# Patient Record
Sex: Female | Born: 1960
Health system: Southern US, Community
[De-identification: ages and names within clinical notes are randomized; demographics above are authoritative.]

## PROBLEM LIST (undated history)

## (undated) DIAGNOSIS — R7302 Impaired glucose tolerance (oral): Secondary | ICD-10-CM

## (undated) DIAGNOSIS — R1013 Epigastric pain: Secondary | ICD-10-CM

## (undated) DIAGNOSIS — E785 Hyperlipidemia, unspecified: Secondary | ICD-10-CM

## (undated) DIAGNOSIS — E669 Obesity, unspecified: Secondary | ICD-10-CM

## (undated) DIAGNOSIS — I519 Heart disease, unspecified: Secondary | ICD-10-CM

## (undated) DIAGNOSIS — K921 Melena: Secondary | ICD-10-CM

## (undated) DIAGNOSIS — I10 Essential (primary) hypertension: Secondary | ICD-10-CM

## (undated) DIAGNOSIS — I429 Cardiomyopathy, unspecified: Secondary | ICD-10-CM

## (undated) DIAGNOSIS — R011 Cardiac murmur, unspecified: Secondary | ICD-10-CM

## (undated) DIAGNOSIS — I509 Heart failure, unspecified: Secondary | ICD-10-CM

## (undated) HISTORY — DX: Melena: K92.1

## (undated) HISTORY — DX: Cardiomyopathy, unspecified: I42.9

## (undated) HISTORY — DX: Obesity, unspecified: E66.9

## (undated) HISTORY — DX: Epigastric pain: R10.13

## (undated) HISTORY — DX: Heart disease, unspecified: I51.9

## (undated) HISTORY — DX: Essential (primary) hypertension: I10

## (undated) HISTORY — DX: Hyperlipidemia, unspecified: E78.5

## (undated) HISTORY — PX: ABDOMINAL HYSTERECTOMY: SHX81

## (undated) HISTORY — PX: TUBAL LIGATION: SHX77

## (undated) HISTORY — DX: Cardiac murmur, unspecified: R01.1

## (undated) HISTORY — DX: Impaired glucose tolerance (oral): R73.02

## (undated) HISTORY — PX: OOPHORECTOMY: SHX86

---

## 2002-01-21 ENCOUNTER — Ambulatory Visit (HOSPITAL_COMMUNITY): Admission: RE | Admit: 2002-01-21 | Discharge: 2002-01-21 | Payer: Self-pay | Admitting: Internal Medicine

## 2002-01-21 ENCOUNTER — Encounter: Payer: Self-pay | Admitting: Internal Medicine

## 2002-07-28 ENCOUNTER — Emergency Department (HOSPITAL_COMMUNITY): Admission: EM | Admit: 2002-07-28 | Discharge: 2002-07-28 | Payer: Self-pay | Admitting: Emergency Medicine

## 2002-12-19 ENCOUNTER — Encounter: Payer: Self-pay | Admitting: Internal Medicine

## 2002-12-19 ENCOUNTER — Ambulatory Visit (HOSPITAL_COMMUNITY): Admission: RE | Admit: 2002-12-19 | Discharge: 2002-12-19 | Payer: Self-pay | Admitting: Internal Medicine

## 2004-01-11 ENCOUNTER — Ambulatory Visit (HOSPITAL_COMMUNITY): Admission: RE | Admit: 2004-01-11 | Discharge: 2004-01-11 | Payer: Self-pay | Admitting: Internal Medicine

## 2004-03-09 ENCOUNTER — Emergency Department (HOSPITAL_COMMUNITY): Admission: EM | Admit: 2004-03-09 | Discharge: 2004-03-10 | Payer: Self-pay | Admitting: *Deleted

## 2005-04-07 ENCOUNTER — Ambulatory Visit: Payer: Self-pay | Admitting: Family Medicine

## 2005-04-09 ENCOUNTER — Encounter (HOSPITAL_COMMUNITY): Admission: RE | Admit: 2005-04-09 | Discharge: 2005-05-09 | Payer: Self-pay | Admitting: Family Medicine

## 2005-04-21 ENCOUNTER — Ambulatory Visit: Payer: Self-pay | Admitting: Family Medicine

## 2005-06-09 ENCOUNTER — Ambulatory Visit: Payer: Self-pay | Admitting: Family Medicine

## 2005-06-10 ENCOUNTER — Ambulatory Visit (HOSPITAL_COMMUNITY): Admission: RE | Admit: 2005-06-10 | Discharge: 2005-06-10 | Payer: Self-pay | Admitting: Family Medicine

## 2005-07-14 ENCOUNTER — Ambulatory Visit: Payer: Self-pay | Admitting: Orthopedic Surgery

## 2005-07-22 ENCOUNTER — Encounter (HOSPITAL_COMMUNITY): Admission: RE | Admit: 2005-07-22 | Discharge: 2005-08-21 | Payer: Self-pay | Admitting: Orthopedic Surgery

## 2005-08-25 ENCOUNTER — Ambulatory Visit: Payer: Self-pay | Admitting: Orthopedic Surgery

## 2005-08-25 ENCOUNTER — Encounter (HOSPITAL_COMMUNITY): Admission: RE | Admit: 2005-08-25 | Discharge: 2005-09-24 | Payer: Self-pay | Admitting: Orthopedic Surgery

## 2005-09-02 ENCOUNTER — Ambulatory Visit: Payer: Self-pay | Admitting: Family Medicine

## 2005-11-10 ENCOUNTER — Ambulatory Visit: Payer: Self-pay | Admitting: Family Medicine

## 2005-11-10 ENCOUNTER — Emergency Department (HOSPITAL_COMMUNITY): Admission: EM | Admit: 2005-11-10 | Discharge: 2005-11-10 | Payer: Self-pay | Admitting: Emergency Medicine

## 2005-11-13 ENCOUNTER — Ambulatory Visit (HOSPITAL_COMMUNITY): Admission: RE | Admit: 2005-11-13 | Discharge: 2005-11-13 | Payer: Self-pay | Admitting: Family Medicine

## 2007-12-09 ENCOUNTER — Ambulatory Visit: Payer: Self-pay | Admitting: Family Medicine

## 2007-12-11 ENCOUNTER — Encounter: Payer: Self-pay | Admitting: Family Medicine

## 2007-12-11 LAB — CONVERTED CEMR LAB
Hemoglobin, Urine: NEGATIVE
Ketones, ur: NEGATIVE mg/dL
Nitrite: NEGATIVE
Protein, ur: NEGATIVE mg/dL
Urobilinogen, UA: 0.2 (ref 0.0–1.0)
pH: 8 (ref 5.0–8.0)

## 2007-12-13 ENCOUNTER — Encounter: Payer: Self-pay | Admitting: Family Medicine

## 2007-12-13 ENCOUNTER — Ambulatory Visit (HOSPITAL_COMMUNITY): Admission: RE | Admit: 2007-12-13 | Discharge: 2007-12-13 | Payer: Self-pay | Admitting: Family Medicine

## 2007-12-13 LAB — CONVERTED CEMR LAB
BUN: 13 mg/dL (ref 6–23)
Basophils Absolute: 0 K/uL (ref 0.0–0.1)
Basophils Relative: 0 % (ref 0–1)
CO2: 27 meq/L (ref 19–32)
Calcium: 9 mg/dL (ref 8.4–10.5)
Chloride: 101 meq/L (ref 96–112)
Cholesterol: 173 mg/dL (ref 0–200)
Creatinine, Ser: 0.81 mg/dL (ref 0.40–1.20)
Eosinophils Absolute: 0.2 K/uL (ref 0.0–0.7)
Eosinophils Relative: 4 % (ref 0–5)
Glucose, Bld: 71 mg/dL (ref 70–99)
HCT: 39 % (ref 36.0–46.0)
HDL: 57 mg/dL (ref >39)
Hemoglobin: 11.6 g/dL — ABNORMAL LOW (ref 12.0–15.0)
LDL Cholesterol: 105 mg/dL — ABNORMAL HIGH (ref 0–99)
Lymphocytes Relative: 32 % (ref 12–46)
Lymphs Abs: 1.6 K/uL (ref 0.7–4.0)
MCHC: 29.7 g/dL — ABNORMAL LOW (ref 30.0–36.0)
MCV: 85.3 fL (ref 78.0–100.0)
Monocytes Absolute: 0.4 K/uL (ref 0.1–1.0)
Monocytes Relative: 7 % (ref 3–12)
Neutro Abs: 2.8 K/uL (ref 1.7–7.7)
Neutrophils Relative %: 56 % (ref 43–77)
Platelets: 456 K/uL — ABNORMAL HIGH (ref 150–400)
Potassium: 4 meq/L (ref 3.5–5.3)
RBC: 4.57 M/uL (ref 3.87–5.11)
RDW: 17.4 % — ABNORMAL HIGH (ref 11.5–15.5)
Sodium: 140 meq/L (ref 135–145)
TSH: 2.727 u[IU]/mL (ref 0.350–5.50)
Total CHOL/HDL Ratio: 3
Triglycerides: 57 mg/dL (ref <150)
VLDL: 11 mg/dL (ref 0–40)
WBC: 5 10*3/microliter (ref 4.0–10.5)

## 2007-12-14 ENCOUNTER — Encounter: Payer: Self-pay | Admitting: Family Medicine

## 2008-03-27 ENCOUNTER — Ambulatory Visit: Payer: Self-pay | Admitting: Family Medicine

## 2008-03-31 DIAGNOSIS — I110 Hypertensive heart disease with heart failure: Secondary | ICD-10-CM | POA: Insufficient documentation

## 2008-03-31 DIAGNOSIS — K3189 Other diseases of stomach and duodenum: Secondary | ICD-10-CM | POA: Insufficient documentation

## 2008-03-31 DIAGNOSIS — R1013 Epigastric pain: Secondary | ICD-10-CM

## 2008-03-31 DIAGNOSIS — I10 Essential (primary) hypertension: Secondary | ICD-10-CM | POA: Insufficient documentation

## 2008-05-08 ENCOUNTER — Ambulatory Visit: Payer: Self-pay | Admitting: Family Medicine

## 2008-05-09 ENCOUNTER — Encounter: Payer: Self-pay | Admitting: Family Medicine

## 2008-05-10 ENCOUNTER — Ambulatory Visit (HOSPITAL_COMMUNITY): Admission: RE | Admit: 2008-05-10 | Discharge: 2008-05-10 | Payer: Self-pay | Admitting: Family Medicine

## 2008-05-12 ENCOUNTER — Ambulatory Visit (HOSPITAL_COMMUNITY): Admission: RE | Admit: 2008-05-12 | Discharge: 2008-05-12 | Payer: Self-pay | Admitting: Family Medicine

## 2008-06-27 ENCOUNTER — Ambulatory Visit (HOSPITAL_COMMUNITY): Admission: RE | Admit: 2008-06-27 | Discharge: 2008-06-27 | Payer: Self-pay | Admitting: Family Medicine

## 2008-08-08 ENCOUNTER — Encounter: Payer: Self-pay | Admitting: Family Medicine

## 2008-09-29 ENCOUNTER — Ambulatory Visit: Payer: Self-pay | Admitting: Family Medicine

## 2008-09-29 DIAGNOSIS — N9489 Other specified conditions associated with female genital organs and menstrual cycle: Secondary | ICD-10-CM | POA: Insufficient documentation

## 2008-12-04 ENCOUNTER — Ambulatory Visit: Payer: Self-pay | Admitting: Family Medicine

## 2008-12-04 DIAGNOSIS — M549 Dorsalgia, unspecified: Secondary | ICD-10-CM | POA: Insufficient documentation

## 2008-12-04 LAB — CONVERTED CEMR LAB
AST: 17 units/L (ref 0–37)
BUN: 12 mg/dL (ref 6–23)
Bilirubin, Direct: <0.1 mg/dL (ref 0.0–0.3)
CO2: 22 meq/L (ref 19–32)
Calcium: 8.8 mg/dL (ref 8.4–10.5)
Glucose, Bld: 66 mg/dL — ABNORMAL LOW (ref 70–99)
Sodium: 141 meq/L (ref 135–145)
Total Bilirubin: 0.3 mg/dL (ref 0.3–1.2)
Total CHOL/HDL Ratio: 3.4

## 2009-05-03 ENCOUNTER — Ambulatory Visit: Payer: Self-pay | Admitting: Family Medicine

## 2009-05-03 DIAGNOSIS — H547 Unspecified visual loss: Secondary | ICD-10-CM | POA: Insufficient documentation

## 2009-05-18 ENCOUNTER — Encounter: Payer: Self-pay | Admitting: Family Medicine

## 2009-08-02 ENCOUNTER — Telehealth: Payer: Self-pay | Admitting: Family Medicine

## 2009-12-19 ENCOUNTER — Encounter: Payer: Self-pay | Admitting: Family Medicine

## 2010-03-04 ENCOUNTER — Ambulatory Visit: Payer: Self-pay | Admitting: Family Medicine

## 2010-03-04 DIAGNOSIS — M25569 Pain in unspecified knee: Secondary | ICD-10-CM | POA: Insufficient documentation

## 2010-11-10 ENCOUNTER — Encounter: Payer: Self-pay | Admitting: Family Medicine

## 2010-11-11 ENCOUNTER — Encounter: Payer: Self-pay | Admitting: Family Medicine

## 2010-11-17 LAB — CONVERTED CEMR LAB
Bilirubin Urine: NEGATIVE
Glucose, Urine, Semiquant: NEGATIVE
Protein, U semiquant: NEGATIVE
pH: 6.5

## 2010-11-19 NOTE — Op Note (Signed)
Summary: LAPAROSCOPIC LEFT OOPHORECTOMY  LAPAROSCOPIC LEFT OOPHORECTOMY   Imported By: Lind Guest 12/26/2009 08:56:22  _____________________________________________________________________  External Attachment:    Type:   Image     Comment:   External Document

## 2010-11-19 NOTE — Assessment & Plan Note (Signed)
Summary: pain- room 1   Vital Signs:  Patient profile:   50 year old female Menstrual status:  perimenopausal Height:      62 inches Weight:      212.25 pounds BMI:     38.96 O2 Sat:      98 % on Room air Pulse rate:   100 / minute Resp:     16 per minute BP sitting:   190 / 90  (left arm)  Vitals Entered By: Adella Hare LPN (Mar 04, 2010 1:16 PM) CC: pain in legs and burning in chest Is Patient Diabetic? No Comments patient is not taking any medications at this time   CC:  pain in legs and burning in chest.  History of Present Illness: Pt states she has been out of BP meds since the first week of March. She is unemployed and has been unable to afford her meds. She reports intermittent indigestion, burning in her chest.  Worse at night when lies down.  No fluid brash.  She has a hx of dyspepsia & used Omeprazole in the past. Pt states she has gained wt since out of work. Also has burning intermittent pain in bilat knees.  She reports this at rest & with wt bearing.  No trauma.  No prev hx of knee pain.  Has had back pain in the past.    Current Medications (verified): 1)  None  Allergies (verified): No Known Drug Allergies  Past History:  Past medical history reviewed for relevance to current acute and chronic problems.  Past Medical History: Reviewed history from 03/31/2008 and no changes required. DYSPEPSIA (ICD-536.8) OBESITY (ICD-278.00) HYPERTENSION (ICD-401.9) PMH reviewed for relevance  Review of Systems CV:  Complains of chest pain or discomfort; denies palpitations. Resp:  Denies cough and shortness of breath. GI:  Complains of indigestion; denies abdominal pain, change in bowel habits, loss of appetite, nausea, and vomiting. GU:  Denies dysuria and urinary frequency. MS:  Complains of mid back pain.  Physical Exam  General:  Well-developed,well-nourished,in no acute distress; alert,appropriate and cooperative throughout examination Head:   Normocephalic and atraumatic without obvious abnormalities. No apparent alopecia or balding. Eyes:  No corneal or conjunctival inflammation noted. EOMI. Perrla. Funduscopic exam benign, without hemorrhages, exudates or papilledema.  Ears:  External ear exam shows no significant lesions or deformities.  Otoscopic examination reveals clear canals, tympanic membranes are intact bilaterally without bulging, retraction, inflammation or discharge. Hearing is grossly normal bilaterally. Nose:  External nasal examination shows no deformity or inflammation. Nasal mucosa are pink and moist without lesions or exudates. Mouth:  Oral mucosa and oropharynx without lesions or exudates.  poor dentition and teeth missing.   Neck:  No deformities, masses, or tenderness noted.no thyromegaly.   Lungs:  Normal respiratory effort, chest expands symmetrically. Lungs are clear to auscultation, no crackles or wheezes. Heart:  Normal rate and regular rhythm. S1 and S2 normal without gallop, murmur, click, rub or other extra sounds. Abdomen:  abdomen soft and non-tender without masses, organomegaly or hernias noted. Msk:  Bilat Knees: normal ROM, no joint swelling, no redness over joints, no joint deformities, no joint instability, no crepitation, and no muscle atrophy.  Mild TTP Rt knee medial & lateral joint lines. Extremities:  No PTE bilat LE.  No calf tenderness Neurologic:  alert & oriented X3, strength normal in all extremities, sensation intact to light touch, gait normal, and DTRs symmetrical and normal.   Cervical Nodes:  No lymphadenopathy noted Psych:  Cognition  and judgment appear intact. Alert and cooperative with normal attention span and concentration. No apparent delusions, illusions, hallucinations   Impression & Recommendations:  Problem # 1:  HYPERTENSION (ICD-401.9) Assessment Deteriorated Discussed with pt the risk of untreated HTN, including kidney failure, stroke and heart problems.  The following  medications were removed from the medication list:    Maxzide 75-50 Mg Tabs (Triamterene-hctz) .Marland Kitchen... Take 1 tablet by mouth once a day    Clonidine Hcl 0.2 Mg Tabs (Clonidine hcl) .Marland Kitchen... Take 1 tab by mouth at bedtime Her updated medication list for this problem includes:    Maxzide 75-50 Mg Tabs (Triamterene-hctz) .Marland Kitchen... Take 1 daily for blood pressure    Clonidine Hcl 0.2 Mg Tabs (Clonidine hcl) .Marland Kitchen... Take 1 daily for blood pressure  Orders: Urinalysis ([id number])  Problem # 2:  DYSPEPSIA (ICD-536.8) Assessment: Deteriorated Advised pt to avoid Ibuprofen at this time.  Problem # 3:  KNEE PAIN, BILATERAL (ICD-719.46) Suspect DJD. Advised pt to avoid Ibuprofen at this time due to her GI complaints. To use Tylenol as needed.  The following medications were removed from the medication list:    Ibuprofen 800 Mg Tabs (Ibuprofen) .Marland Kitchen... Take 1 tablet by mouth two times a day as needed  Complete Medication List: 1)  Maxzide 75-50 Mg Tabs (Triamterene-hctz) .... Take 1 daily for blood pressure 2)  Clonidine Hcl 0.2 Mg Tabs (Clonidine hcl) .... Take 1 daily for blood pressure 3)  Ranitidine Hcl 150 Mg Caps (Ranitidine hcl) .... Take 1 two times a day for indigestion  Other Orders: EKG w/ Interpretation (93000)  Patient Instructions: 1)  Please schedule a follow-up appointment in 1 month. 2)  It is important that you exercise regularly at least 20 minutes 5 times a week. If you develop chest pain, have severe difficulty breathing, or feel very tired , stop exercising immediately and seek medical attention. 3)  You need to lose weight. Consider a lower calorie diet and regular exercise.  4)  It is very important that you take your blood pressure medications every day!  If you dont you are putting your self at risk for heart problems, stroke and kidney damage. 5)  I have also prescribed a medication for your indigestion. 6)  Take 650-1000mg  of Tylenol every 4-6 hours as needed for relief of  pain or comfort of fever AVOID taking more than 4000mg   in a 24 hour period (can cause liver damage in higher doses). Prescriptions: RANITIDINE HCL 150 MG CAPS (RANITIDINE HCL) take 1 two times a day for indigestion  #180 x 1   Entered and Authorized by:   Esperanza Sheets PA   Signed by:   Esperanza Sheets PA on 03/04/2010   Method used:   Electronically to        [facility] (retail)       [address]       [place], [place]  [address]       Ph: [phone]       Fax: [phone]   RxID:   [id number] CLONIDINE HCL 0.2 MG TABS (CLONIDINE HCL) take 1 daily for blood pressure  #90 x 1   Entered and Authorized by:   Esperanza Sheets PA   Signed by:   Esperanza Sheets PA on 03/04/2010   Method used:   Electronically to        [facility] (retail)  [address]       [place], [place]  [address]       Ph: [phone]       Fax: [phone]   RxID:   [id number] MAXZIDE 75-50 MG TABS (TRIAMTERENE-HCTZ) take 1 daily for blood pressure  #90 x 1   Entered and Authorized by:   Esperanza Sheets PA   Signed by:   Esperanza Sheets PA on 03/04/2010   Method used:   Electronically to        [facility] (retail)       [address]       [place], [place]  [address]       Ph: [phone]       Fax: [phone]   RxID:   [id number]   Laboratory Results   Urine Tests  Date/Time Received: Mar 04, 2010 1:57 PM  Date/Time Reported: Mar 04, 2010 1:57 PM   Routine Urinalysis   Color: yellow Appearance: Clear Glucose: negative   (Normal Range: Negative) Bilirubin: negative   (Normal Range: Negative) Ketone: negative   (Normal Range: Negative) Spec. Gravity: 1.025   (Normal Range: 1.003-1.035) Blood: trace-intact   (Normal Range: Negative) pH: 6.5   (Normal Range: 5.0-8.0) Protein: negative   (Normal Range: Negative) Urobilinogen: 1.0   (Normal Range: 0-1) Nitrite: negative   (Normal Range:  Negative) Leukocyte Esterace: trace   (Normal Range: Negative)

## 2011-04-01 ENCOUNTER — Encounter: Payer: Self-pay | Admitting: Family Medicine

## 2011-04-03 ENCOUNTER — Encounter: Payer: Self-pay | Admitting: Family Medicine

## 2011-04-07 ENCOUNTER — Other Ambulatory Visit: Payer: Self-pay | Admitting: Family Medicine

## 2011-04-07 ENCOUNTER — Encounter: Payer: Self-pay | Admitting: Family Medicine

## 2011-04-07 ENCOUNTER — Ambulatory Visit (INDEPENDENT_AMBULATORY_CARE_PROVIDER_SITE_OTHER): Payer: PRIVATE HEALTH INSURANCE | Admitting: Family Medicine

## 2011-04-07 VITALS — BP 190/112 | HR 88 | Resp 16 | Ht 62.0 in | Wt 220.1 lb

## 2011-04-07 DIAGNOSIS — R7309 Other abnormal glucose: Secondary | ICD-10-CM

## 2011-04-07 DIAGNOSIS — M25569 Pain in unspecified knee: Secondary | ICD-10-CM

## 2011-04-07 DIAGNOSIS — R5383 Other fatigue: Secondary | ICD-10-CM

## 2011-04-07 DIAGNOSIS — K3189 Other diseases of stomach and duodenum: Secondary | ICD-10-CM

## 2011-04-07 DIAGNOSIS — R1013 Epigastric pain: Secondary | ICD-10-CM

## 2011-04-07 DIAGNOSIS — R079 Chest pain, unspecified: Secondary | ICD-10-CM | POA: Insufficient documentation

## 2011-04-07 DIAGNOSIS — R7302 Impaired glucose tolerance (oral): Secondary | ICD-10-CM

## 2011-04-07 DIAGNOSIS — Z13820 Encounter for screening for osteoporosis: Secondary | ICD-10-CM

## 2011-04-07 DIAGNOSIS — R5381 Other malaise: Secondary | ICD-10-CM

## 2011-04-07 DIAGNOSIS — E669 Obesity, unspecified: Secondary | ICD-10-CM

## 2011-04-07 DIAGNOSIS — I10 Essential (primary) hypertension: Secondary | ICD-10-CM

## 2011-04-07 DIAGNOSIS — Z139 Encounter for screening, unspecified: Secondary | ICD-10-CM

## 2011-04-07 MED ORDER — AMLODIPINE BESYLATE 5 MG PO TABS: 5.0000 mg | ORAL_TABLET | Freq: Every day | ORAL | Status: DC

## 2011-04-07 MED ORDER — TRIAMTERENE-HCTZ 75-50 MG PO TABS: 1.0000 | ORAL_TABLET | Freq: Every day | ORAL | Status: DC

## 2011-04-07 NOTE — Assessment & Plan Note (Signed)
Medication compliance addressed. Commitment to regular exercise and healthy  food choices, with portion control discussed. DASH diet and low fat diet discussed and literature offered. Changes in medication made at this visit.  

## 2011-04-07 NOTE — Progress Notes (Signed)
  Subjective:    Patient ID: Michelle David, female    DOB: [birth year], 50 y.o.   MRN: [id number]  HPI Intermittent substernal chest pain , most recent 5 months , radiates to left forearm, burning, aggrvated by activity, no associated lightheadedness or nausea.hs noted that she was having exertional fatigue. Mom died in her early 53's uncertain of cause. She has has noted pain , instability and swelling of the left knee, worsening in the past year C/o sorenensss in the feet    Review of Systems     Objective:   Physical Exam        Assessment & Plan:

## 2011-04-07 NOTE — Patient Instructions (Addendum)
F/u in 6 to 8 weeks.  Fasting labs asap.  You need a mammogram, we will schedule.  You are being referred to cardiology  Re chest pain and abnormal EKG. And also to orthopedics about your left knee  Med is sent in for blood pressure  Need to stop caffeine , and drink water, need to lose weight

## 2011-04-14 ENCOUNTER — Ambulatory Visit (HOSPITAL_COMMUNITY): Payer: PRIVATE HEALTH INSURANCE

## 2011-04-16 NOTE — Assessment & Plan Note (Signed)
New chest pain with fatigue and  abnormal ekg , will refer for cardiology eval

## 2011-04-16 NOTE — Progress Notes (Addendum)
  Subjective:    Patient ID: Michelle David, female    DOB: 1961-03-23, 50 y.o.   MRN: [id number]  HPI  The PT is here for follow up and re-evaluation of chronic medical conditions and  medication management . She has been non compliant with medication, and has not been seen for several years reportedly due to lack of insurance..Preventive health is updated, .   She c/o intermittent chest pain for the past several months and fatigue. Chest pain has no specific relationship to physical activity or position, it is non radiating and she has no associated nausea or diaphoresis, she has however been experiencing increased exertional fatigue     Review of Systems Denies recent fever or chills. Denies sinus pressure, nasal congestion, ear pain or sore throat. Denies chest congestion, productive cough or wheezing. Denies  palpitations, paroxysmal nocturnal dyspnea, orthopnea and leg swelling Denies abdominal pain, nausea, vomiting,diarrhea or constipation.   Denies dysuria, frequency, hesitancy or incontinence. C/o left  knee pain  and limitation in mobility. Denies headaches, seizure, numbness, or tingling. Denies depression, anxiety or insomnia. Denies skin break down or rash.        Objective:   Physical Exam Patient alert and oriented and in no Cardiopulmonary distress.  HEENT: No facial asymmetry, EOMI, no sinus tenderness, TM's clear, Oropharynx pink and moist.  Neck supple no adenopathy.  Chest: Clear to auscultation bilaterally.No reproducible chest wall tenderness CVS: S1, S2 no murmurs, no S3.  ABD: Soft non tender. Bowel sounds normal.  Ext: No edema  MS: Adequate ROM spine, shoulders, hips and reduced in left  knee.  Skin: Intact, no ulcerations or rash noted.  Psych: Good eye contact, normal affect. Memory intact not anxious or depressed appearing.  CNS: CN 2-12 intact, power, tone and sensation normal throughout.        Assessment & Plan:

## 2011-04-17 ENCOUNTER — Encounter: Payer: Self-pay | Admitting: Family Medicine

## 2011-04-22 ENCOUNTER — Encounter: Payer: Self-pay | Admitting: Cardiology

## 2011-04-22 ENCOUNTER — Encounter: Payer: Self-pay | Admitting: *Deleted

## 2011-04-22 ENCOUNTER — Ambulatory Visit (INDEPENDENT_AMBULATORY_CARE_PROVIDER_SITE_OTHER): Payer: PRIVATE HEALTH INSURANCE | Admitting: Cardiology

## 2011-04-22 DIAGNOSIS — R0602 Shortness of breath: Secondary | ICD-10-CM

## 2011-04-22 DIAGNOSIS — I10 Essential (primary) hypertension: Secondary | ICD-10-CM

## 2011-04-22 DIAGNOSIS — R079 Chest pain, unspecified: Secondary | ICD-10-CM

## 2011-04-22 DIAGNOSIS — R011 Cardiac murmur, unspecified: Secondary | ICD-10-CM

## 2011-04-22 NOTE — Progress Notes (Signed)
Clinical Summary Ms. Rather is a 50 y.o.female referred for cardiology consultation by Dr. Lodema Hong. She reports a one-year history of chest discomfort and shortness of breath, mainly with exertion. She describes a "soreness" in her chest that initially seemed to be more precipitated by "bending over." She began to notice this more with activities such as house chores and also at work, has also experienced some "tightness" in her chest. She states that she has been thinking more about her health recently, try to start walking regularly, but was limited by significant fatigue. She is just recently back on medications to address hypertension, and has established regular follow up with Dr. Lodema Hong.  Reports a history of "heart murmur."  She reports no prior cardiac testing beyond ECGs.  Today we discussed her diet, plans for weight loss, also importance of compliance with medical therapy and general medical followup. Also discussed options for further evaluation of her symptoms, to exclude the presence of obstructive CAD.   No Known Allergies  Current outpatient prescriptions:amLODipine (NORVASC) 5 MG tablet, Take 1 tablet (5 mg total) by mouth daily., Disp: 30 tablet, Rfl: 3;  triamterene-hydrochlorothiazide (MAXZIDE) 75-50 MG per tablet, Take 1 tablet by mouth daily., Disp: 30 tablet, Rfl: 5  Past Medical History  Diagnosis Date  . Essential hypertension, benign   . Impaired glucose tolerance   . Dyspepsia   . Obesity     Past Surgical History  Procedure Date  . Abdominal hysterectomy   . Oophorectomy   . Tubal ligation     Family History  Problem Relation Age of Onset  . Heart disease Mother     Died in her 29s with MI  . Hypertension Mother     Social History Ms. Arbaugh reports that she has never smoked. She has never used smokeless tobacco. Ms. Vankirk reports that she does not drink alcohol.  Review of Systems No palpitations or syncope. Some foot pain with ambulation. No  orthopnea, PND. Otherwise reviewed and negative.  Physical Examination Filed Vitals:   04/22/11 0827  BP: 178/99  Pulse: 74  Obese woman in no acute distress. HEENT: Conjunctiva and lids are normal, oropharynx with poor dentition. Neck: Supple, no elevated JVP or carotid bruits. Lungs: Clear to auscultation, nonlabored. Cardiac: Regular rate and rhythm, soft systolic murmur at the base, preserved second heart sound, no S3 gallop or rub. Abdomen: Obese, nontender, bowel sounds present. Skin: Warm and dry. Extremities: No pitting edema, distal pulses one plus. Musculoskeletal: No kyphosis. Neuropsychiatric: Alert and oriented x3, affect appropriate.   ECG Normal sinus rhythm at 75 beats per minute with LVH, nonspecific T wave changes.    Problem List and Plan

## 2011-04-22 NOTE — Assessment & Plan Note (Signed)
Recently back on medications per Dr. Lodema Hong.

## 2011-04-22 NOTE — Patient Instructions (Signed)
Your physician recommends that you schedule a follow-up appointment in: 3 weeks Your physician has requested that you have an echocardiogram. Echocardiography is a painless test that uses sound waves to create images of your heart. It provides your doctor with information about the size and shape of your heart and how well your heart's chambers and valves are working. This procedure takes approximately one hour. There are no restrictions for this procedure.  Your physician has requested that you have en exercise stress myoview. For further information please visit [email or web address] Please follow instruction sheet, as given.

## 2011-04-22 NOTE — Assessment & Plan Note (Signed)
As outlined above, with some features typical for angina in the setting of hypertension, obesity, reported glucose intolerance, and family history of premature coronary artery disease. Baseline ECG shows LVH. She just recently established for regular medical followup, back on medications for blood pressure. Seems to be more motivated to lose weight and start exercising. After discussing the matter, plan is to proceed with an exercise Myoview for objective ischemic evaluation. Followup will be arranged.

## 2011-04-22 NOTE — Assessment & Plan Note (Signed)
Likely benign. In light of her symptoms, 2-D echocardiogram to be obtained for assessment of cardiac structure and function.

## 2011-04-22 NOTE — Assessment & Plan Note (Signed)
Could be multifactorial. Weight loss and better blood pressure control is necessary. We discussed this today. As noted above, further ischemic testing is also being arranged.

## 2011-04-24 ENCOUNTER — Other Ambulatory Visit: Payer: Self-pay | Admitting: Cardiology

## 2011-04-25 ENCOUNTER — Ambulatory Visit: Payer: PRIVATE HEALTH INSURANCE | Admitting: Adult Health

## 2011-04-29 ENCOUNTER — Encounter: Payer: Self-pay | Admitting: *Deleted

## 2011-04-30 ENCOUNTER — Ambulatory Visit (INDEPENDENT_AMBULATORY_CARE_PROVIDER_SITE_OTHER): Payer: PRIVATE HEALTH INSURANCE | Admitting: *Deleted

## 2011-04-30 ENCOUNTER — Encounter (HOSPITAL_COMMUNITY): Payer: Self-pay | Admitting: Cardiology

## 2011-04-30 ENCOUNTER — Encounter (HOSPITAL_COMMUNITY): Payer: PRIVATE HEALTH INSURANCE

## 2011-04-30 ENCOUNTER — Ambulatory Visit (HOSPITAL_COMMUNITY): Payer: PRIVATE HEALTH INSURANCE | Attending: Cardiology

## 2011-04-30 ENCOUNTER — Encounter (HOSPITAL_COMMUNITY)
Admission: RE | Admit: 2011-04-30 | Discharge: 2011-04-30 | Disposition: A | Discharge status: Home / Self Care | Payer: PRIVATE HEALTH INSURANCE | Source: Ambulatory Visit | Attending: Cardiology | Admitting: Cardiology

## 2011-04-30 ENCOUNTER — Encounter (HOSPITAL_COMMUNITY): Payer: Self-pay

## 2011-04-30 DIAGNOSIS — R079 Chest pain, unspecified: Secondary | ICD-10-CM | POA: Insufficient documentation

## 2011-04-30 DIAGNOSIS — I10 Essential (primary) hypertension: Secondary | ICD-10-CM

## 2011-04-30 DIAGNOSIS — R0602 Shortness of breath: Secondary | ICD-10-CM

## 2011-04-30 DIAGNOSIS — R011 Cardiac murmur, unspecified: Secondary | ICD-10-CM

## 2011-04-30 DIAGNOSIS — R0609 Other forms of dyspnea: Secondary | ICD-10-CM | POA: Insufficient documentation

## 2011-04-30 DIAGNOSIS — R0989 Other specified symptoms and signs involving the circulatory and respiratory systems: Secondary | ICD-10-CM | POA: Insufficient documentation

## 2011-04-30 MED ORDER — TECHNETIUM TC 99M TETROFOSMIN IV KIT
30.0000 | PACK | Freq: Once | INTRAVENOUS | Status: AC | PRN
  Administered 2011-04-30: 29.6 via INTRAVENOUS

## 2011-04-30 MED ORDER — TECHNETIUM TC 99M TETROFOSMIN IV KIT
10.0000 | PACK | Freq: Once | INTRAVENOUS | Status: AC | PRN
  Administered 2011-04-30: 10.33 via INTRAVENOUS

## 2011-04-30 NOTE — Progress Notes (Deleted)

## 2011-05-15 ENCOUNTER — Ambulatory Visit: Payer: PRIVATE HEALTH INSURANCE | Admitting: Orthopedic Surgery

## 2011-05-16 NOTE — Progress Notes (Signed)
  Encounter note per Epic request

## 2011-05-23 ENCOUNTER — Ambulatory Visit: Payer: PRIVATE HEALTH INSURANCE | Admitting: Cardiology

## 2011-05-27 ENCOUNTER — Ambulatory Visit: Payer: PRIVATE HEALTH INSURANCE | Admitting: Orthopedic Surgery

## 2011-05-28 ENCOUNTER — Ambulatory Visit (HOSPITAL_COMMUNITY)
Admission: RE | Admit: 2011-05-28 | Discharge: 2011-05-28 | Disposition: A | Discharge status: Home / Self Care | Payer: PRIVATE HEALTH INSURANCE | Source: Ambulatory Visit | Attending: Cardiology | Admitting: Cardiology

## 2011-05-28 DIAGNOSIS — Z8249 Family history of ischemic heart disease and other diseases of the circulatory system: Secondary | ICD-10-CM | POA: Insufficient documentation

## 2011-05-28 DIAGNOSIS — R0989 Other specified symptoms and signs involving the circulatory and respiratory systems: Secondary | ICD-10-CM | POA: Insufficient documentation

## 2011-05-28 DIAGNOSIS — I10 Essential (primary) hypertension: Secondary | ICD-10-CM | POA: Insufficient documentation

## 2011-05-28 DIAGNOSIS — R011 Cardiac murmur, unspecified: Secondary | ICD-10-CM | POA: Insufficient documentation

## 2011-05-28 DIAGNOSIS — R072 Precordial pain: Secondary | ICD-10-CM | POA: Insufficient documentation

## 2011-05-28 DIAGNOSIS — R0609 Other forms of dyspnea: Secondary | ICD-10-CM | POA: Insufficient documentation

## 2011-05-28 NOTE — Progress Notes (Signed)
*  PRELIMINARY RESULTS* Echocardiogram 2D Echocardiogram has been performed.  Michelle David 05/28/2011, 10:11 AM

## 2011-06-02 ENCOUNTER — Encounter: Payer: Self-pay | Admitting: *Deleted

## 2011-06-02 ENCOUNTER — Encounter: Payer: Self-pay | Admitting: Family Medicine

## 2011-06-02 ENCOUNTER — Ambulatory Visit (INDEPENDENT_AMBULATORY_CARE_PROVIDER_SITE_OTHER): Payer: PRIVATE HEALTH INSURANCE | Admitting: Family Medicine

## 2011-06-02 VITALS — BP 160/100 | HR 81 | Ht 62.0 in | Wt 221.0 lb

## 2011-06-02 DIAGNOSIS — E669 Obesity, unspecified: Secondary | ICD-10-CM

## 2011-06-02 DIAGNOSIS — Z13820 Encounter for screening for osteoporosis: Secondary | ICD-10-CM

## 2011-06-02 DIAGNOSIS — R5381 Other malaise: Secondary | ICD-10-CM

## 2011-06-02 DIAGNOSIS — R5383 Other fatigue: Secondary | ICD-10-CM

## 2011-06-02 DIAGNOSIS — Z13220 Encounter for screening for lipoid disorders: Secondary | ICD-10-CM

## 2011-06-02 DIAGNOSIS — I10 Essential (primary) hypertension: Secondary | ICD-10-CM

## 2011-06-02 MED ORDER — TRIAMTERENE-HCTZ 37.5-25 MG PO CAPS: 1.0000 | ORAL_CAPSULE | Freq: Every day | ORAL | Status: DC

## 2011-06-02 MED ORDER — AMLODIPINE BESYLATE 10 MG PO TABS: 10.0000 mg | ORAL_TABLET | Freq: Every day | ORAL | Status: DC

## 2011-06-02 NOTE — Patient Instructions (Addendum)
CPE in 6 weeks  Your blood pressure is so HIGH you could have a STROKE, I am changing the doses since you report that you are constantly urinating.  It is VITAL to take tabs regulalrly as prescribed, same time every day  Labs today, fasting  Pls call cardiology for your f/u appt.  We will sched your mammogram  It is important that you exercise regularly at least 30 minutes 5 times a week. If you develop chest pain, have severe difficulty breathing, or feel very tired, stop exercising immediately and seek medical attention    A healthy diet is rich in fruit, vegetables and whole grains. Poultry fish, nuts and beans are a healthy choice for protein rather then red meat. A low sodium diet and drinking 64 ounces of water daily is generally recommended. Oils and sweet should be limited. Carbohydrates especially for those who are diabetic or overweight, should be limited to 34-45 gram per meal. It is important to eat on a regular schedule, at least 3 times daily. Snacks should be primarily fruits, vegetables or nuts.

## 2011-06-03 LAB — CBC WITH DIFFERENTIAL/PLATELET
Basophils Absolute: 0 10*3/uL (ref 0.0–0.1)
Eosinophils Relative: 4 % (ref 0–5)
Lymphocytes Relative: 35 % (ref 12–46)
Lymphs Abs: 2 10*3/uL (ref 0.7–4.0)
MCV: 88.4 fL (ref 78.0–100.0)
Neutro Abs: 3.2 10*3/uL (ref 1.7–7.7)
Neutrophils Relative %: 55 % (ref 43–77)
Platelets: 402 10*3/uL — ABNORMAL HIGH (ref 150–400)
RBC: 4.31 MIL/uL (ref 3.87–5.11)
RDW: 15.6 % — ABNORMAL HIGH (ref 11.5–15.5)
WBC: 5.8 10*3/uL (ref 4.0–10.5)

## 2011-06-03 LAB — BASIC METABOLIC PANEL
CO2: 30 mEq/L (ref 19–32)
Calcium: 9.2 mg/dL (ref 8.4–10.5)
Creat: 0.87 mg/dL (ref 0.50–1.10)
Sodium: 139 mEq/L (ref 135–145)

## 2011-06-03 LAB — LIPID PANEL
HDL: 49 mg/dL (ref >39)
LDL Cholesterol: 135 mg/dL — ABNORMAL HIGH (ref 0–99)
Total CHOL/HDL Ratio: 4 Ratio
Triglycerides: 68 mg/dL (ref <150)
VLDL: 14 mg/dL (ref 0–40)

## 2011-06-03 LAB — HEMOGLOBIN A1C
Hgb A1c MFr Bld: 5.6 % (ref <5.7)
Mean Plasma Glucose: 114 mg/dL (ref <117)

## 2011-06-03 LAB — TSH: TSH: 2.248 u[IU]/mL (ref 0.350–4.500)

## 2011-06-04 LAB — VITAMIN D 1,25 DIHYDROXY
Vitamin D2 1, 25 (OH)2: <8 pg/mL
Vitamin D3 1, 25 (OH)2: 43 pg/mL

## 2011-06-05 ENCOUNTER — Ambulatory Visit: Payer: PRIVATE HEALTH INSURANCE | Admitting: Cardiology

## 2011-06-09 ENCOUNTER — Ambulatory Visit (HOSPITAL_COMMUNITY)
Admission: RE | Admit: 2011-06-09 | Discharge: 2011-06-09 | Disposition: A | Discharge status: Home / Self Care | Payer: PRIVATE HEALTH INSURANCE | Source: Ambulatory Visit | Attending: Family Medicine | Admitting: Family Medicine

## 2011-06-09 DIAGNOSIS — Z139 Encounter for screening, unspecified: Secondary | ICD-10-CM

## 2011-06-09 DIAGNOSIS — Z1231 Encounter for screening mammogram for malignant neoplasm of breast: Secondary | ICD-10-CM | POA: Insufficient documentation

## 2011-06-09 NOTE — Progress Notes (Signed)
  Subjective:    Patient ID: Michelle David, female    DOB: Dec 22, 1960, 50 y.o.   MRN: [id number]  HPI The PT is here for follow up and re-evaluation of chronic medical conditions, medication management and review of any available recent lab and radiology data.  Preventive health is updated, specifically  Cancer screening and Immunization.   Questions or concerns regarding consultations or procedures which the PT has had in the interim are  addressed. The PT denies any adverse reactions to current medications since the last visit.  There are no new concerns.  There are no specific complaints       Review of Systems Denies recent fever or chills. Denies sinus pressure, nasal congestion, ear pain or sore throat. Denies chest congestion, productive cough or wheezing. Denies chest pains, palpitations and leg swelling Denies abdominal pain, nausea, vomiting,diarrhea or constipation.   Denies dysuria, frequency, hesitancy or incontinence. Denies joint pain, swelling and limitation in mobility. Denies headaches, seizures, numbness, or tingling. Denies depression, anxiety or insomnia. Denies skin break down or rash.        Objective:   Physical Exam Patient alert and oriented and in no cardiopulmonary distress.  HEENT: No facial asymmetry, EOMI, no sinus tenderness,  oropharynx pink and moist.  Neck supple no adenopathy.  Chest: Clear to auscultation bilaterally.  CVS: S1, S2 no murmurs, no S3.  ABD: Soft non tender. Bowel sounds normal.  Ext: No edema  MS: Adequate ROM spine, shoulders, hips and knees.  Skin: Intact, no ulcerations or rash noted.  Psych: Good eye contact, normal affect. Memory intact not anxious or depressed appearing.  CNS: CN 2-12 intact, power, tone and sensation normal throughout.        Assessment & Plan:

## 2011-06-09 NOTE — Assessment & Plan Note (Signed)
Deteriorated. Patient re-educated about  the importance of commitment to a  minimum of 150 minutes of exercise per week. The importance of healthy food choices with portion control discussed. Encouraged to start a food diary, count calories and to consider  joining a support group. Sample diet sheets offered. Goals set by the patient for the next several months.    

## 2011-06-09 NOTE — Assessment & Plan Note (Signed)
Medication compliance addressed. Commitment to regular exercise and healthy  food choices, with portion control discussed. DASH diet and low fat diet discussed and literature offered. Changes in medication made at this visit.  

## 2011-06-17 ENCOUNTER — Ambulatory Visit: Payer: PRIVATE HEALTH INSURANCE | Admitting: Orthopedic Surgery

## 2011-07-31 ENCOUNTER — Encounter: Payer: Self-pay | Admitting: Family Medicine

## 2011-08-05 ENCOUNTER — Encounter: Payer: PRIVATE HEALTH INSURANCE | Admitting: Family Medicine

## 2012-01-26 ENCOUNTER — Ambulatory Visit (INDEPENDENT_AMBULATORY_CARE_PROVIDER_SITE_OTHER): Payer: PRIVATE HEALTH INSURANCE | Admitting: Family Medicine

## 2012-01-26 ENCOUNTER — Encounter: Payer: Self-pay | Admitting: Family Medicine

## 2012-01-26 VITALS — BP 180/100 | HR 87 | Resp 16 | Wt 225.0 lb

## 2012-01-26 DIAGNOSIS — F3289 Other specified depressive episodes: Secondary | ICD-10-CM

## 2012-01-26 DIAGNOSIS — F32A Depression, unspecified: Secondary | ICD-10-CM

## 2012-01-26 DIAGNOSIS — M67919 Unspecified disorder of synovium and tendon, unspecified shoulder: Secondary | ICD-10-CM

## 2012-01-26 DIAGNOSIS — R7301 Impaired fasting glucose: Secondary | ICD-10-CM

## 2012-01-26 DIAGNOSIS — N3000 Acute cystitis without hematuria: Secondary | ICD-10-CM

## 2012-01-26 DIAGNOSIS — M719 Bursopathy, unspecified: Secondary | ICD-10-CM

## 2012-01-26 DIAGNOSIS — R319 Hematuria, unspecified: Secondary | ICD-10-CM | POA: Insufficient documentation

## 2012-01-26 DIAGNOSIS — I10 Essential (primary) hypertension: Secondary | ICD-10-CM

## 2012-01-26 DIAGNOSIS — M7551 Bursitis of right shoulder: Secondary | ICD-10-CM | POA: Insufficient documentation

## 2012-01-26 DIAGNOSIS — F329 Major depressive disorder, single episode, unspecified: Secondary | ICD-10-CM

## 2012-01-26 DIAGNOSIS — E669 Obesity, unspecified: Secondary | ICD-10-CM

## 2012-01-26 LAB — POCT URINALYSIS DIPSTICK
Glucose, UA: NEGATIVE
Ketones, UA: NEGATIVE
Spec Grav, UA: 1.02
Urobilinogen, UA: 0.2

## 2012-01-26 MED ORDER — TRIAMTERENE-HCTZ 37.5-25 MG PO CAPS: 1.0000 | ORAL_CAPSULE | Freq: Every day | ORAL | Status: DC

## 2012-01-26 MED ORDER — AMLODIPINE BESYLATE 10 MG PO TABS: 10.0000 mg | ORAL_TABLET | Freq: Every day | ORAL | Status: DC

## 2012-01-26 MED ORDER — PREDNISONE (PAK) 5 MG PO TABS: 5.0000 mg | ORAL_TABLET | ORAL | Status: DC

## 2012-01-26 MED ORDER — FLUOXETINE HCL (PMDD) 10 MG PO CAPS: 10.0000 mg | ORAL_CAPSULE | Freq: Every day | ORAL | Status: DC

## 2012-01-26 NOTE — Assessment & Plan Note (Signed)
Symptomatic with abnormal CCUa , will wait on c/s before prescribing antibiotic

## 2012-01-26 NOTE — Assessment & Plan Note (Signed)
Deteriorated. Patient re-educated about  the importance of commitment to a  minimum of 150 minutes of exercise per week. The importance of healthy food choices with portion control discussed. Encouraged to start a food diary, count calories and to consider  joining a support group. Sample diet sheets offered. Goals set by the patient for the next several months.    

## 2012-01-26 NOTE — Progress Notes (Signed)
  Subjective:    Patient ID: Michelle David, female    DOB: 1961-03-19, 51 y.o.   MRN: [id number]  HPI  The PT is here for follow up and re-evaluation of chronic medical conditions, medication management and review of any available recent lab and radiology data.  Preventive health is updated, specifically  Cancer screening and Immunization.   The PT denies any adverse reactions to current medications since the last visit.  Tearful, c/o feeling depressed , overwhelmed, states she knows she is not taking care of herself, but her spouse is sick and unemployed and things are overwhelming. Not suicidal or homicidal, cries daily, no hallucinations. C/o right  shoulder pain with swelling and reduced mobility, no recent trauma.Also c/o left knee pain C/o urinary frequencty and mild burning for the past 3 days, denies fever, chills , flank pain or nausea    Review of Systems See HPI Denies recent fever or chills. Denies sinus pressure, nasal congestion, ear pain or sore throat. Denies chest congestion, productive cough or wheezing. Denies chest pains, palpitations and leg swelling Denies abdominal pain, nausea, vomiting,diarrhea or constipation.   Denies headaches, seizures, numbness, or tingling. Denies skin break down or rash.        Objective:   Physical Exam  Patient alert and oriented and in no cardiopulmonary distress.Tearful  HEENT: No facial asymmetry, EOMI, no sinus tenderness,  oropharynx pink and moist.  Neck supple no adenopathy.  Chest: Clear to auscultation bilaterally.  CVS: S1, S2 systolic  murmur, no S3.  ABD: Soft non tender. Bowel sounds normal.  Ext: No edema  MS: Adequate ROM spine,decreased in right   shoulder, and left  knees.  Skin: Intact, no ulcerations or rash noted.  Psych: Good eye contact, normal affect. Memory intact  depressed appearing.  CNS: CN 2-12 intact, power, tone and sensation normal throughout.       Assessment & Plan:

## 2012-01-26 NOTE — Patient Instructions (Signed)
CPE in 2 month  Fasting lipid, cmp, HBA1C today.  Medication is sent in for hypertension, bursitis and depression, please take as directed  It is important that you exercise regularly at least 30 minutes 5 times a week. If you develop chest pain, have severe difficulty breathing, or feel very tired, stop exercising immediately and seek medical attention   A healthy diet is rich in fruit, vegetables and whole grains. Poultry fish, nuts and beans are a healthy choice for protein rather then red meat. A low sodium diet and drinking 64 ounces of water daily is generally recommended. Oils and sweet should be limited. Carbohydrates especially for those who are diabetic or overweight, should be limited to 04-45 gram per meal. It is important to eat on a regular schedule, at least 3 times daily. Snacks should be primarily fruits, vegetables or nuts.  Urine will be checked today for infection

## 2012-01-26 NOTE — Assessment & Plan Note (Signed)
Uncontrolled and worsened in the past 3 months, not suicidal or homicidal, pt to start medication, does not want therapy

## 2012-01-26 NOTE — Assessment & Plan Note (Signed)
Uncontrolled, pt to increase dose of medication , maxzide

## 2012-01-26 NOTE — Assessment & Plan Note (Signed)
Specimen sent for c/s

## 2012-01-26 NOTE — Assessment & Plan Note (Signed)
uncontrolled pain with reduced mobility, steroid course, short and sharp

## 2012-01-27 ENCOUNTER — Telehealth: Payer: Self-pay | Admitting: Family Medicine

## 2012-01-27 LAB — LIPID PANEL
Cholesterol: 176 mg/dL (ref 0–200)
HDL: 54 mg/dL (ref >39)
Total CHOL/HDL Ratio: 3.3 Ratio
Triglycerides: 58 mg/dL (ref <150)
VLDL: 12 mg/dL (ref 0–40)

## 2012-01-27 LAB — HEMOGLOBIN A1C
Hgb A1c MFr Bld: 5.7 % — ABNORMAL HIGH (ref <5.7)
Mean Plasma Glucose: 117 mg/dL — ABNORMAL HIGH (ref <117)

## 2012-01-27 LAB — COMPLETE METABOLIC PANEL WITH GFR
AST: 15 U/L (ref 0–37)
Alkaline Phosphatase: 77 U/L (ref 39–117)
BUN: 13 mg/dL (ref 6–23)
Creat: 0.66 mg/dL (ref 0.50–1.10)
GFR, Est Non African American: >89 mL/min
Glucose, Bld: 67 mg/dL — ABNORMAL LOW (ref 70–99)

## 2012-01-27 NOTE — Telephone Encounter (Signed)
Blood pressure was too high for her to take phentermine has to be controlled pls let her know

## 2012-01-27 NOTE — Telephone Encounter (Signed)
Not on med list

## 2012-01-28 ENCOUNTER — Telehealth: Payer: Self-pay | Admitting: Family Medicine

## 2012-01-28 NOTE — Telephone Encounter (Signed)
Patient aware.

## 2012-01-28 NOTE — Telephone Encounter (Signed)
See previous message

## 2012-01-28 NOTE — Telephone Encounter (Signed)
Called patient and left message for them to return call at the office   

## 2012-01-29 LAB — URINE CULTURE

## 2012-03-30 ENCOUNTER — Ambulatory Visit: Payer: PRIVATE HEALTH INSURANCE | Admitting: Family Medicine

## 2012-04-27 ENCOUNTER — Ambulatory Visit: Payer: PRIVATE HEALTH INSURANCE | Admitting: Family Medicine

## 2012-04-30 ENCOUNTER — Ambulatory Visit (INDEPENDENT_AMBULATORY_CARE_PROVIDER_SITE_OTHER): Payer: PRIVATE HEALTH INSURANCE | Admitting: Family Medicine

## 2012-04-30 ENCOUNTER — Encounter: Payer: Self-pay | Admitting: Family Medicine

## 2012-04-30 VITALS — BP 150/102 | HR 71 | Resp 16 | Ht 62.0 in | Wt 224.0 lb

## 2012-04-30 DIAGNOSIS — F329 Major depressive disorder, single episode, unspecified: Secondary | ICD-10-CM

## 2012-04-30 DIAGNOSIS — E669 Obesity, unspecified: Secondary | ICD-10-CM

## 2012-04-30 DIAGNOSIS — I10 Essential (primary) hypertension: Secondary | ICD-10-CM

## 2012-04-30 DIAGNOSIS — F3289 Other specified depressive episodes: Secondary | ICD-10-CM

## 2012-04-30 DIAGNOSIS — F32A Depression, unspecified: Secondary | ICD-10-CM

## 2012-04-30 MED ORDER — CLONIDINE HCL 0.2 MG PO TABS: ORAL_TABLET | ORAL | Status: DC

## 2012-04-30 NOTE — Patient Instructions (Addendum)
cPE  in 2 month  New additional medication for blood pressure is clonidine 0.2mg  at bedtime Continue other blood pressure meds as before, blood pressure is still too high  It is important that you exercise regularly at least  45 minutes 5 times a week. If you develop chest pain, have severe difficulty breathing, or feel very tired, stop exercising immediately and seek medical attention   A healthy diet is rich in fruit, vegetables and whole grains. Poultry fish, nuts and beans are a healthy choice for protein rather then red meat. A low sodium diet and drinking 64 ounces of water daily is generally recommended. Oils and sweet should be limited. Carbohydrates especially for those who are diabetic or overweight, should be limited to 30-45 gram per meal. It is important to eat on a regular schedule, at least 3 times daily. Snacks should be primarily fruits, vegetables or nuts.  sTOP dRINKING sWEETEND dRINKS

## 2012-05-02 NOTE — Assessment & Plan Note (Signed)
Improved, continue current dose of fluoxetine

## 2012-05-02 NOTE — Progress Notes (Signed)
  Subjective:    Patient ID: Michelle David, female    DOB: September 23, 1961, 51 y.o.   MRN: [id number]  HPI The PT is here for follow up and re-evaluation of chronic medical conditions, medication management and review of any available recent lab and radiology data.  Preventive health is updated, specifically  Cancer screening and Immunization.   Questions or concerns regarding consultations or procedures which the PT has had in the interim are  addressed. The PT denies any adverse reactions to current medications since the last visit. Reports improvement in depression on prozac, wants to continue current dose There are no new concerns.  There are no specific complaints       Review of Systems See HPI Denies recent fever or chills. Denies sinus pressure, nasal congestion, ear pain or sore throat. Denies chest congestion, productive cough or wheezing. Denies chest pains, palpitations and leg swelling Denies abdominal pain, nausea, vomiting,diarrhea or constipation.   Denies dysuria, frequency, hesitancy or incontinence. Denies joint pain, swelling and limitation in mobility. Denies headaches, seizures, numbness, or tingling. Denies depression, anxiety or insomnia. Denies skin break down or rash.        Objective:   Physical Exam Patient alert and oriented and in no cardiopulmonary distress.  HEENT: No facial asymmetry, EOMI, no sinus tenderness,  oropharynx pink and moist.  Neck supple no adenopathy.  Chest: Clear to auscultation bilaterally.  CVS: S1, S2 no murmurs, no S3.  ABD: Soft non tender. Bowel sounds normal.  Ext: No edema  MS: Adequate ROM spine, shoulders, hips and knees.  Skin: Intact, no ulcerations or rash noted.  Psych: Good eye contact, normal affect. Memory intact not anxious or depressed appearing.  CNS: CN 2-12 intact, power, tone and sensation normal throughout.       Assessment & Plan:

## 2012-05-02 NOTE — Assessment & Plan Note (Signed)
Uncontrolled add clonidine

## 2012-05-02 NOTE — Assessment & Plan Note (Signed)
Deteriorated. Patient re-educated about  the importance of commitment to a  minimum of 150 minutes of exercise per week. The importance of healthy food choices with portion control discussed. Encouraged to start a food diary, count calories and to consider  joining a support group. Sample diet sheets offered. Goals set by the patient for the next several months.    

## 2012-07-02 ENCOUNTER — Other Ambulatory Visit: Payer: Self-pay | Admitting: Family Medicine

## 2012-07-02 DIAGNOSIS — Z139 Encounter for screening, unspecified: Secondary | ICD-10-CM

## 2012-07-05 ENCOUNTER — Encounter: Payer: PRIVATE HEALTH INSURANCE | Admitting: Family Medicine

## 2012-07-12 ENCOUNTER — Ambulatory Visit (HOSPITAL_COMMUNITY)
Admission: RE | Admit: 2012-07-12 | Discharge: 2012-07-12 | Disposition: A | Discharge status: Home / Self Care | Payer: PRIVATE HEALTH INSURANCE | Source: Ambulatory Visit | Attending: Family Medicine | Admitting: Family Medicine

## 2012-07-12 DIAGNOSIS — Z139 Encounter for screening, unspecified: Secondary | ICD-10-CM

## 2012-07-12 DIAGNOSIS — Z1231 Encounter for screening mammogram for malignant neoplasm of breast: Secondary | ICD-10-CM | POA: Insufficient documentation

## 2012-11-12 ENCOUNTER — Ambulatory Visit: Payer: PRIVATE HEALTH INSURANCE | Admitting: Family Medicine

## 2012-12-03 ENCOUNTER — Ambulatory Visit: Payer: PRIVATE HEALTH INSURANCE | Admitting: Family Medicine

## 2013-05-26 ENCOUNTER — Ambulatory Visit (INDEPENDENT_AMBULATORY_CARE_PROVIDER_SITE_OTHER): Payer: PRIVATE HEALTH INSURANCE | Admitting: Family Medicine

## 2013-05-26 ENCOUNTER — Encounter: Payer: Self-pay | Admitting: Family Medicine

## 2013-05-26 ENCOUNTER — Ambulatory Visit: Payer: PRIVATE HEALTH INSURANCE | Admitting: Family Medicine

## 2013-05-26 VITALS — BP 180/100 | HR 97 | Resp 18 | Ht 62.0 in | Wt 221.0 lb

## 2013-05-26 DIAGNOSIS — H1013 Acute atopic conjunctivitis, bilateral: Secondary | ICD-10-CM | POA: Insufficient documentation

## 2013-05-26 DIAGNOSIS — I10 Essential (primary) hypertension: Secondary | ICD-10-CM

## 2013-05-26 DIAGNOSIS — E669 Obesity, unspecified: Secondary | ICD-10-CM

## 2013-05-26 DIAGNOSIS — F32A Depression, unspecified: Secondary | ICD-10-CM

## 2013-05-26 DIAGNOSIS — E8881 Metabolic syndrome: Secondary | ICD-10-CM

## 2013-05-26 DIAGNOSIS — R7309 Other abnormal glucose: Secondary | ICD-10-CM

## 2013-05-26 DIAGNOSIS — F919 Conduct disorder, unspecified: Secondary | ICD-10-CM

## 2013-05-26 DIAGNOSIS — Z23 Encounter for immunization: Secondary | ICD-10-CM

## 2013-05-26 DIAGNOSIS — H1045 Other chronic allergic conjunctivitis: Secondary | ICD-10-CM

## 2013-05-26 DIAGNOSIS — F3289 Other specified depressive episodes: Secondary | ICD-10-CM

## 2013-05-26 DIAGNOSIS — E785 Hyperlipidemia, unspecified: Secondary | ICD-10-CM

## 2013-05-26 DIAGNOSIS — F329 Major depressive disorder, single episode, unspecified: Secondary | ICD-10-CM

## 2013-05-26 DIAGNOSIS — R4689 Other symptoms and signs involving appearance and behavior: Secondary | ICD-10-CM

## 2013-05-26 DIAGNOSIS — R7303 Prediabetes: Secondary | ICD-10-CM

## 2013-05-26 MED ORDER — TRIAMTERENE-HCTZ 37.5-25 MG PO TABS: 1.0000 | ORAL_TABLET | Freq: Every day | ORAL | Status: DC

## 2013-05-26 MED ORDER — AMLODIPINE BESYLATE 10 MG PO TABS: 10.0000 mg | ORAL_TABLET | Freq: Every day | ORAL | Status: DC

## 2013-05-26 MED ORDER — LORATADINE 10 MG PO CAPS: 1.0000 | ORAL_CAPSULE | Freq: Every day | ORAL | Status: DC

## 2013-05-26 NOTE — Progress Notes (Signed)
  Subjective:    Patient ID: Michelle David, female    DOB: Sep 21, 1961, 52 y.o.   MRN: [id number]  HPI The PT is here for follow up and re-evaluation of chronic medical conditions, medication management and review of any available recent lab and radiology data.  Preventive health is updated, specifically  Cancer screening and Immunization.    The PT denies any adverse reactions to current medications since the last visit.  c/o itchy watery eyes      Review of Systems See HPI Denies recent fever or chills. Denies sinus pressure,does have  nasal congestion,denies  ear pain or sore throat. Denies chest congestion, productive cough or wheezing. Denies chest pains, palpitations and leg swelling Denies abdominal pain, nausea, vomiting,diarrhea or constipation.   Denies dysuria, frequency, hesitancy or incontinence. Denies joint pain, swelling and limitation in mobility. Denies headaches, seizures, numbness, or tingling. Denies depression,increased stress at home with ill spouse and limited finances. Denies skin break down or rash.        Objective:   Physical Exam  Patient alert and oriented and in no cardiopulmonary distress.  HEENT: No facial asymmetry, EOMI, no sinus tenderness,  oropharynx pink and moist.  Neck supple no adenopathy.Watery eyes, no conjunctival injection, nasal mucosa erythematous . Extremely poor dentition   Chest: Clear to auscultation bilaterally.  CVS: S1, S2 no murmurs, no S3.  ABD: Soft non tender. Bowel sounds normal.  Ext: No edema  MS: Adequate ROM spine, shoulders, hips and knees.  Skin: Intact, no ulcerations or rash noted.  Psych: Good eye contact, normal affect. Memory intact not anxious or depressed appearing.  CNS: CN 2-12 intact, power, tone and sensation normal throughout.       Assessment & Plan:

## 2013-05-26 NOTE — Patient Instructions (Addendum)
CPE in early October, call if yoou need me  Before  Fasting lipid, chem 7, HBA1C , TSH and cBc end September   TDAp today   Mammogram due in September, please schedule   Blood pressure is too high, which increases heart damage and risk of stroke . Two meds are sent in, amlodipine and maxzide  Pls stop sodas, since you know you need to lose weight  Claritin sent in today for eye allergy

## 2013-05-29 DIAGNOSIS — R7303 Prediabetes: Secondary | ICD-10-CM | POA: Insufficient documentation

## 2013-05-29 DIAGNOSIS — E8881 Metabolic syndrome: Secondary | ICD-10-CM | POA: Insufficient documentation

## 2013-05-29 DIAGNOSIS — E785 Hyperlipidemia, unspecified: Secondary | ICD-10-CM | POA: Insufficient documentation

## 2013-05-29 DIAGNOSIS — R4689 Other symptoms and signs involving appearance and behavior: Secondary | ICD-10-CM | POA: Insufficient documentation

## 2013-05-29 NOTE — Assessment & Plan Note (Signed)
Deteriorated. Patient re-educated about  the importance of commitment to a  minimum of 150 minutes of exercise per week. The importance of healthy food choices with portion control discussed. Encouraged to start a food diary, count calories and to consider  joining a support group. Sample diet sheets offered. Goals set by the patient for the next several months.    

## 2013-05-29 NOTE — Assessment & Plan Note (Signed)
Uncontrolled, non compliant. Counselled re need to change attitude to and health behavior DASH diet and commitment to daily physical activity for a minimum of 30 minutes discussed and encouraged, as a part of hypertension management. The importance of attaining a healthy weight is also discussed.

## 2013-05-29 NOTE — Assessment & Plan Note (Signed)
Explained increased CV risk and need to change lifestyle and address health issues more consistently and seriously

## 2013-05-29 NOTE — Assessment & Plan Note (Signed)
Updated lab needed Patient educated about the importance of limiting  Carbohydrate intake , the need to commit to daily physical activity for a minimum of 30 minutes , and to commit weight loss. The fact that changes in all these areas will reduce or eliminate all together the development of diabetes is stressed.    

## 2013-05-29 NOTE — Assessment & Plan Note (Signed)
Start daily claritin 

## 2013-05-29 NOTE — Assessment & Plan Note (Signed)
Chronic uncontrolled illnesses, inconsistent med use and not keeping f/u as she should, re educated re the need to change this Does have stress of ill spouse , being sole breadwinner and limited finances

## 2013-05-29 NOTE — Assessment & Plan Note (Signed)
Hyperlipidemia:Low fat diet discussed and encouraged.  Updated lab needed 

## 2013-06-14 ENCOUNTER — Other Ambulatory Visit: Payer: Self-pay | Admitting: Family Medicine

## 2013-06-14 DIAGNOSIS — Z139 Encounter for screening, unspecified: Secondary | ICD-10-CM

## 2013-07-18 ENCOUNTER — Inpatient Hospital Stay (HOSPITAL_COMMUNITY): Admission: RE | Admit: 2013-07-18 | Payer: PRIVATE HEALTH INSURANCE | Source: Ambulatory Visit

## 2013-07-25 ENCOUNTER — Ambulatory Visit (HOSPITAL_COMMUNITY): Payer: PRIVATE HEALTH INSURANCE

## 2013-08-01 ENCOUNTER — Ambulatory Visit: Payer: PRIVATE HEALTH INSURANCE | Admitting: Family Medicine

## 2013-08-29 ENCOUNTER — Inpatient Hospital Stay (HOSPITAL_COMMUNITY): Admission: RE | Admit: 2013-08-29 | Payer: PRIVATE HEALTH INSURANCE | Source: Ambulatory Visit

## 2013-09-05 ENCOUNTER — Ambulatory Visit: Payer: PRIVATE HEALTH INSURANCE | Admitting: Family Medicine

## 2013-10-31 ENCOUNTER — Ambulatory Visit (HOSPITAL_COMMUNITY): Payer: PRIVATE HEALTH INSURANCE

## 2013-11-21 ENCOUNTER — Ambulatory Visit (HOSPITAL_COMMUNITY)
Admission: RE | Admit: 2013-11-21 | Discharge: 2013-11-21 | Disposition: A | Discharge status: Home / Self Care | Payer: BC Managed Care – PPO | Source: Ambulatory Visit | Attending: Family Medicine | Admitting: Family Medicine

## 2013-11-21 DIAGNOSIS — Z1231 Encounter for screening mammogram for malignant neoplasm of breast: Secondary | ICD-10-CM | POA: Insufficient documentation

## 2013-11-21 DIAGNOSIS — Z139 Encounter for screening, unspecified: Secondary | ICD-10-CM

## 2013-12-20 ENCOUNTER — Encounter: Payer: PRIVATE HEALTH INSURANCE | Admitting: Family Medicine

## 2014-03-07 ENCOUNTER — Other Ambulatory Visit (HOSPITAL_COMMUNITY)
Admission: RE | Admit: 2014-03-07 | Discharge: 2014-03-07 | Disposition: A | Discharge status: Home / Self Care | Payer: BC Managed Care – PPO | Source: Ambulatory Visit | Attending: Family Medicine | Admitting: Family Medicine

## 2014-03-07 ENCOUNTER — Ambulatory Visit (INDEPENDENT_AMBULATORY_CARE_PROVIDER_SITE_OTHER): Payer: BC Managed Care – PPO | Admitting: Family Medicine

## 2014-03-07 ENCOUNTER — Encounter: Payer: Self-pay | Admitting: Family Medicine

## 2014-03-07 ENCOUNTER — Encounter (INDEPENDENT_AMBULATORY_CARE_PROVIDER_SITE_OTHER): Payer: Self-pay

## 2014-03-07 VITALS — BP 170/100 | HR 81 | Resp 16 | Ht 62.0 in | Wt 222.4 lb

## 2014-03-07 DIAGNOSIS — N760 Acute vaginitis: Secondary | ICD-10-CM | POA: Insufficient documentation

## 2014-03-07 DIAGNOSIS — Z01419 Encounter for gynecological examination (general) (routine) without abnormal findings: Secondary | ICD-10-CM | POA: Diagnosis present

## 2014-03-07 DIAGNOSIS — Z1211 Encounter for screening for malignant neoplasm of colon: Secondary | ICD-10-CM

## 2014-03-07 DIAGNOSIS — Z0000 Encounter for general adult medical examination without abnormal findings: Secondary | ICD-10-CM

## 2014-03-07 DIAGNOSIS — I10 Essential (primary) hypertension: Secondary | ICD-10-CM

## 2014-03-07 DIAGNOSIS — J309 Allergic rhinitis, unspecified: Secondary | ICD-10-CM

## 2014-03-07 DIAGNOSIS — Z1151 Encounter for screening for human papillomavirus (HPV): Secondary | ICD-10-CM | POA: Insufficient documentation

## 2014-03-07 DIAGNOSIS — Z124 Encounter for screening for malignant neoplasm of cervix: Secondary | ICD-10-CM

## 2014-03-07 DIAGNOSIS — Z113 Encounter for screening for infections with a predominantly sexual mode of transmission: Secondary | ICD-10-CM | POA: Diagnosis present

## 2014-03-07 DIAGNOSIS — E785 Hyperlipidemia, unspecified: Secondary | ICD-10-CM

## 2014-03-07 DIAGNOSIS — J302 Other seasonal allergic rhinitis: Secondary | ICD-10-CM

## 2014-03-07 DIAGNOSIS — R102 Pelvic and perineal pain: Secondary | ICD-10-CM

## 2014-03-07 DIAGNOSIS — R7303 Prediabetes: Secondary | ICD-10-CM

## 2014-03-07 DIAGNOSIS — R7309 Other abnormal glucose: Secondary | ICD-10-CM

## 2014-03-07 DIAGNOSIS — N949 Unspecified condition associated with female genital organs and menstrual cycle: Secondary | ICD-10-CM

## 2014-03-07 LAB — CBC WITH DIFFERENTIAL/PLATELET
BASOS ABS: 0 10*3/uL (ref 0.0–0.1)
Basophils Relative: 0 % (ref 0–1)
Eosinophils Absolute: 0.2 10*3/uL (ref 0.0–0.7)
Eosinophils Relative: 4 % (ref 0–5)
HCT: 38.2 % (ref 36.0–46.0)
Hemoglobin: 12.7 g/dL (ref 12.0–15.0)
LYMPHS ABS: 1.7 10*3/uL (ref 0.7–4.0)
Lymphocytes Relative: 39 % (ref 12–46)
MCH: 28.6 pg (ref 26.0–34.0)
MCHC: 33.2 g/dL (ref 30.0–36.0)
MCV: 86 fL (ref 78.0–100.0)
Monocytes Absolute: 0.3 10*3/uL (ref 0.1–1.0)
Monocytes Relative: 6 % (ref 3–12)
NEUTROS ABS: 2.2 10*3/uL (ref 1.7–7.7)
Neutrophils Relative %: 51 % (ref 43–77)
Platelets: 412 10*3/uL — ABNORMAL HIGH (ref 150–400)
RBC: 4.44 MIL/uL (ref 3.87–5.11)
RDW: 15.2 % (ref 11.5–15.5)
WBC: 4.3 10*3/uL (ref 4.0–10.5)

## 2014-03-07 LAB — HEMOGLOBIN A1C
Hgb A1c MFr Bld: 5.3 % (ref <5.7)
MEAN PLASMA GLUCOSE: 105 mg/dL (ref <117)

## 2014-03-07 MED ORDER — FLUTICASONE PROPIONATE 50 MCG/ACT NA SUSP: 2.0000 | Freq: Every day | NASAL | Status: DC

## 2014-03-07 NOTE — Patient Instructions (Addendum)
F/u in first week in July, call if you need me before  Medication (2) sent in for your BP which is high   HBA1c, lipid, cmp and eGFr, TSH and CBc   Today,   You are referred for colonoscopy with Dr Lynn Ito will get flonase spray to use daily for allergies  You will be referred fro pelvic US to further evaluate your pain

## 2014-03-08 ENCOUNTER — Telehealth: Payer: Self-pay | Admitting: Family Medicine

## 2014-03-08 DIAGNOSIS — I10 Essential (primary) hypertension: Secondary | ICD-10-CM

## 2014-03-08 LAB — LIPID PANEL
CHOLESTEROL: 177 mg/dL (ref 0–200)
HDL: 50 mg/dL (ref >39)
LDL CALC: 112 mg/dL — AB (ref 0–99)
Total CHOL/HDL Ratio: 3.5 Ratio
Triglycerides: 73 mg/dL (ref <150)
VLDL: 15 mg/dL (ref 0–40)

## 2014-03-08 LAB — TSH: TSH: 1.42 u[IU]/mL (ref 0.350–4.500)

## 2014-03-08 LAB — COMPLETE METABOLIC PANEL WITH GFR
ALK PHOS: 67 U/L (ref 39–117)
ALT: 22 U/L (ref 0–35)
AST: 17 U/L (ref 0–37)
Albumin: 3.7 g/dL (ref 3.5–5.2)
BILIRUBIN TOTAL: 0.3 mg/dL (ref 0.2–1.2)
BUN: 17 mg/dL (ref 6–23)
CALCIUM: 9.1 mg/dL (ref 8.4–10.5)
CO2: 29 mEq/L (ref 19–32)
CREATININE: 0.91 mg/dL (ref 0.50–1.10)
Chloride: 103 mEq/L (ref 96–112)
GFR, Est African American: 85 mL/min
GFR, Est Non African American: 74 mL/min
Glucose, Bld: 66 mg/dL — ABNORMAL LOW (ref 70–99)
Potassium: 4 mEq/L (ref 3.5–5.3)
Sodium: 140 mEq/L (ref 135–145)
Total Protein: 8.6 g/dL — ABNORMAL HIGH (ref 6.0–8.3)

## 2014-03-08 MED ORDER — AMLODIPINE BESYLATE 10 MG PO TABS: 10.0000 mg | ORAL_TABLET | Freq: Every day | ORAL | Status: DC

## 2014-03-08 MED ORDER — TRIAMTERENE-HCTZ 37.5-25 MG PO TABS: 1.0000 | ORAL_TABLET | Freq: Every day | ORAL | Status: DC

## 2014-03-08 MED ORDER — CLONIDINE HCL 0.2 MG PO TABS: ORAL_TABLET | ORAL | Status: DC

## 2014-03-08 NOTE — Telephone Encounter (Signed)
meds sent

## 2014-03-10 ENCOUNTER — Ambulatory Visit (HOSPITAL_COMMUNITY)
Admission: RE | Admit: 2014-03-10 | Discharge: 2014-03-10 | Disposition: A | Discharge status: Home / Self Care | Payer: BC Managed Care – PPO | Source: Ambulatory Visit | Attending: Family Medicine | Admitting: Family Medicine

## 2014-03-10 DIAGNOSIS — R102 Pelvic and perineal pain: Secondary | ICD-10-CM

## 2014-03-10 DIAGNOSIS — N949 Unspecified condition associated with female genital organs and menstrual cycle: Secondary | ICD-10-CM | POA: Insufficient documentation

## 2014-03-14 MED ORDER — METRONIDAZOLE 500 MG PO TABS: 500.0000 mg | ORAL_TABLET | Freq: Two times a day (BID) | ORAL | Status: DC

## 2014-04-20 ENCOUNTER — Ambulatory Visit: Payer: BC Managed Care – PPO | Admitting: Family Medicine

## 2014-04-24 DIAGNOSIS — J302 Other seasonal allergic rhinitis: Secondary | ICD-10-CM | POA: Insufficient documentation

## 2014-04-24 DIAGNOSIS — Z1211 Encounter for screening for malignant neoplasm of colon: Secondary | ICD-10-CM | POA: Insufficient documentation

## 2014-04-24 DIAGNOSIS — G8929 Other chronic pain: Secondary | ICD-10-CM | POA: Insufficient documentation

## 2014-04-24 DIAGNOSIS — R102 Pelvic and perineal pain: Secondary | ICD-10-CM | POA: Insufficient documentation

## 2014-04-24 NOTE — Assessment & Plan Note (Signed)
Increased and uncontrolled medication prescribed ,a dn pt also advised to flush with saline daily

## 2014-04-24 NOTE — Progress Notes (Signed)
Subjective:    Patient ID: Michelle David, female    DOB: 18-Nov-1960, 53 y.o.   MRN: [id number]  HPI  Patient is in for anual physical exam. C/o chronic left pelvic pain, despite oophorectomy, worsening in the past 6 month. C/o uncontrolled allergies x 5 weeks, with change in the season, excessive watery eyes, sneezing and nasal congestion, denies fever , chills or productive cough   Review of Systems See HPI     Objective:   Physical Exam BP 170/100  Pulse 81  Resp 16  Ht 5\' 2"  (1.575 m)  Wt 222 lb 6.4 oz (100.88 kg)  BMI 40.67 kg/m2  SpO2 100% Pleasant well nourished female, alert and oriented x 3, in no cardio-pulmonary distress. Afebrile. HEENT No facial trauma or asymetry. Sinuses non tender.  EOMI, PERTL, fundoscopic exam  no hemorhage or exudate. Bilateral conjunctival injection, and erythema and edema of nasal mucosa External ears normal, tympanic membranes clear. Oropharynx moist, no exudate, extremely poor dentition. Neck: supple, no adenopathy,JVD or thyromegaly.No bruits.  Chest: Clear to ascultation bilaterally.No crackles or wheezes. Non tender to palpation  Breast: No asymetry,no masses or lumps. No tenderness. No nipple discharge or inversion. No axillary or supraclavicular adenopathy  Cardiovascular system; Heart sounds normal,  S1 and  S2 ,no S3.  No murmur, or thrill. Apical beat not displaced Peripheral pulses normal.  Abdomen: Soft, non tender, no organomegaly or masses. No bruits. Bowel sounds normal. No guarding, tenderness or rebound.  Rectal:  Normal sphincter tone. No mass.No rectal masses.  Guaiac negative stool.  GU: External genitalia normal female genitalia , female distribution of hair. No lesions. Urethral meatus normal in size, no  Prolapse, no lesions visibly  Present. Bladder non tender. Vagina pink and moist , with no visible lesions ,malodoorous  discharge present . Adequate pelvic support no  cystocele or  rectocele noted Cervix appears healthy, no lesions or ulcers seen, no d/c seen from cervical os Uterus normal size, no adnexal masses, no cervical motion or adnexal tenderness.   Musculoskeletal exam: Full ROM of spine, hips , shoulders and knees. No deformity ,swelling or crepitus noted. No muscle wasting or atrophy.   Neurologic: Cranial nerves 2 to 12 intact. Power, tone ,sensation and reflexes normal throughout. No disturbance in gait. No tremor.  Skin: Intact, no ulceration, erythema , scaling or rash noted. Pigmentation normal throughout  Psych; Normal mood and affect. Judgement and concentration normal        Assessment & Plan:  Encounter for annual physical exam Annual exam as documented. Counseling done  re healthy lifestyle involving commitment to 150 minutes exercise per week, heart healthy diet, and attaining healthy weight.The importance of adequate sleep also discussed. Regular seat belt use and safe storage  of firearms if patient has them, is also discussed. Changes in health habits are decided on by the patient with goals and time frames  set for achieving them. Immunization and cancer screening needs are specifically addressed at this visit.   HTN (hypertension), malignant Uncontrolled, med adjustment made with close f/u DASH diet and commitment to daily physical activity for a minimum of 30 minutes discussed and encouraged, as a part of hypertension management. The importance of attaining a healthy weight is also discussed.   Chronic pelvic pain in female 6 month h/o worsening pelvic pain s/p oophorectomy, refer for [place], pelvic exam non revealing  Seasonal allergies Increased and uncontrolled medication prescribed ,a dn pt also advised to flush with saline daily

## 2014-04-24 NOTE — Assessment & Plan Note (Signed)
6 month h/o worsening pelvic pain s/p oophorectomy, refer for [place], pelvic exam non revealing

## 2014-04-24 NOTE — Assessment & Plan Note (Signed)
Uncontrolled, med adjustment made with close f/u DASH diet and commitment to daily physical activity for a minimum of 30 minutes discussed and encouraged, as a part of hypertension management. The importance of attaining a healthy weight is also discussed.

## 2014-04-24 NOTE — Assessment & Plan Note (Signed)
Annual exam as documented. Counseling done  re healthy lifestyle involving commitment to 150 minutes exercise per week, heart healthy diet, and attaining healthy weight.The importance of adequate sleep also discussed. Regular seat belt use and safe storage  of firearms if patient has them, is also discussed. Changes in health habits are decided on by the patient with goals and time frames  set for achieving them. Immunization and cancer screening needs are specifically addressed at this visit.  

## 2014-05-31 ENCOUNTER — Ambulatory Visit: Payer: BC Managed Care – PPO | Admitting: Family Medicine

## 2014-05-31 ENCOUNTER — Telehealth: Payer: Self-pay

## 2014-05-31 NOTE — Telephone Encounter (Signed)
LMOM to call back

## 2014-06-06 NOTE — Telephone Encounter (Signed)
Pt was referred by Dr. Moshe Cipro for a screening colonoscopy. She has not responded to letter or phone call.  Will inform Dr. Moshe Cipro.

## 2014-06-06 NOTE — Telephone Encounter (Signed)
Noted, thanks, will address with her at next visit

## 2014-06-12 ENCOUNTER — Ambulatory Visit (INDEPENDENT_AMBULATORY_CARE_PROVIDER_SITE_OTHER): Payer: BC Managed Care – PPO | Admitting: Family Medicine

## 2014-06-12 ENCOUNTER — Encounter (INDEPENDENT_AMBULATORY_CARE_PROVIDER_SITE_OTHER): Payer: Self-pay

## 2014-06-12 ENCOUNTER — Encounter: Payer: Self-pay | Admitting: Family Medicine

## 2014-06-12 VITALS — BP 144/86 | HR 68 | Resp 18 | Wt 228.0 lb

## 2014-06-12 DIAGNOSIS — I10 Essential (primary) hypertension: Secondary | ICD-10-CM

## 2014-06-12 DIAGNOSIS — E669 Obesity, unspecified: Secondary | ICD-10-CM

## 2014-06-12 DIAGNOSIS — E785 Hyperlipidemia, unspecified: Secondary | ICD-10-CM

## 2014-06-12 MED ORDER — CLONIDINE HCL 0.3 MG PO TABS: 0.3000 mg | ORAL_TABLET | Freq: Every day | ORAL | Status: DC

## 2014-06-12 MED ORDER — PHENTERMINE HCL 37.5 MG PO TABS: 37.5000 mg | ORAL_TABLET | Freq: Every day | ORAL | Status: DC

## 2014-06-12 NOTE — Patient Instructions (Signed)
F/u in  2nd week in December, call if you need me before   Increase in dose of clonidine to 0.3 mg one at bedtime  NEW to help with controlling appetite is phentermine take HALF tablet with breakfast, call if problems  It is important that you exercise regularly at least 30 minutes 5 times a week. If you develop chest pain, have severe difficulty breathing, or feel very tired, stop exercising immediately and seek medical attention   Weight loss goal is 3 to 4 pounds per month

## 2014-06-26 DIAGNOSIS — E785 Hyperlipidemia, unspecified: Secondary | ICD-10-CM | POA: Insufficient documentation

## 2014-06-26 NOTE — Assessment & Plan Note (Signed)
Deteriorated. Patient re-educated about  the importance of commitment to a  minimum of 150 minutes of exercise per week. The importance of healthy food choices with portion control discussed. Encouraged to start a food diary, count calories and to consider  joining a support group. Sample diet sheets offered. Goals set by the patient for the next several months.   Start half phentermine daily, adverse s/e discussed including black box warning

## 2014-06-26 NOTE — Assessment & Plan Note (Signed)
Hyperlipidemia:Low fat diet discussed and encouraged.   

## 2014-06-26 NOTE — Assessment & Plan Note (Signed)
Improved, but still uncontrolled, increase clonidine dose. DASH diet and commitment to daily physical activity for a minimum of 30 minutes discussed and encouraged, as a part of hypertension management. The importance of attaining a healthy weight is also discussed.

## 2014-06-26 NOTE — Progress Notes (Signed)
   Subjective:    Patient ID: Michelle David, female    DOB: 05-13-1961, 53 y.o.   MRN: [id number]  HPI The PT is here for follow up and re-evaluation of chronic medical conditions,in particular uncontrolled hTN,  medication management and review of any available recent lab and radiology data.  Preventive health is updated, specifically  Cancer screening and Immunization.   The PT denies any adverse reactions to current medications since the last visit.  Requests help with weight lposs and use of appetite suppresant    Review of Systems See HPI Denies recent fever or chills. Denies sinus pressure, nasal congestion, ear pain or sore throat. Denies chest congestion, productive cough or wheezing. Denies chest pains, palpitations and leg swelling Denies abdominal pain, nausea, vomiting,diarrhea or constipation.   Denies dysuria, frequency, hesitancy or incontinence. Denies joint pain, swelling and limitation in mobility. Denies headaches, seizures, numbness, or tingling. Denies depression, anxiety or insomnia. Denies skin break down or rash.        Objective:   Physical Exam BP 144/86  Pulse 68  Resp 18  Wt 228 lb 0.6 oz (103.438 kg)  SpO2 97% Patient alert and oriented and in no cardiopulmonary distress.  HEENT: No facial asymmetry, EOMI,   oropharynx pink and moist.  Neck supple no JVD, no mass.  Chest: Clear to auscultation bilaterally.  CVS: S1, S2 no murmurs, no S3.Regular rate.  ABD: Soft non tender.   Ext: No edema  MS: Adequate ROM spine, shoulders, hips and knees.  Skin: Intact, no ulcerations or rash noted.  Psych: Good eye contact, normal affect. Memory intact not anxious or depressed appearing.  CNS: CN 2-12 intact, power,  normal throughout.no focal deficits noted.        Assessment & Plan:  HTN (hypertension), malignant Improved, but still uncontrolled, increase clonidine dose. DASH diet and commitment to daily physical activity for a  minimum of 30 minutes discussed and encouraged, as a part of hypertension management. The importance of attaining a healthy weight is also discussed.   OBESITY Deteriorated. Patient re-educated about  the importance of commitment to a  minimum of 150 minutes of exercise per week. The importance of healthy food choices with portion control discussed. Encouraged to start a food diary, count calories and to consider  joining a support group. Sample diet sheets offered. Goals set by the patient for the next several months.   Start half phentermine daily, adverse s/e discussed including black box warning   Dyslipidemia Hyperlipidemia:Low fat diet discussed and encouraged.

## 2014-10-03 ENCOUNTER — Ambulatory Visit: Payer: BC Managed Care – PPO | Admitting: Family Medicine

## 2014-10-03 ENCOUNTER — Encounter: Payer: Self-pay | Admitting: *Deleted

## 2014-11-08 ENCOUNTER — Ambulatory Visit: Payer: BC Managed Care – PPO | Admitting: Family Medicine

## 2014-11-13 ENCOUNTER — Encounter (HOSPITAL_COMMUNITY): Payer: Self-pay | Admitting: Emergency Medicine

## 2014-11-13 ENCOUNTER — Emergency Department (HOSPITAL_COMMUNITY)
Admission: EM | Admit: 2014-11-13 | Discharge: 2014-11-13 | Disposition: A | Discharge status: Home / Self Care | Payer: BLUE CROSS/BLUE SHIELD | Attending: Emergency Medicine | Admitting: Emergency Medicine

## 2014-11-13 DIAGNOSIS — T7840XA Allergy, unspecified, initial encounter: Secondary | ICD-10-CM | POA: Diagnosis not present

## 2014-11-13 DIAGNOSIS — E669 Obesity, unspecified: Secondary | ICD-10-CM | POA: Insufficient documentation

## 2014-11-13 DIAGNOSIS — X58XXXA Exposure to other specified factors, initial encounter: Secondary | ICD-10-CM | POA: Insufficient documentation

## 2014-11-13 DIAGNOSIS — Y9289 Other specified places as the place of occurrence of the external cause: Secondary | ICD-10-CM | POA: Insufficient documentation

## 2014-11-13 DIAGNOSIS — Z7951 Long term (current) use of inhaled steroids: Secondary | ICD-10-CM | POA: Insufficient documentation

## 2014-11-13 DIAGNOSIS — I10 Essential (primary) hypertension: Secondary | ICD-10-CM | POA: Insufficient documentation

## 2014-11-13 DIAGNOSIS — Y9389 Activity, other specified: Secondary | ICD-10-CM | POA: Diagnosis not present

## 2014-11-13 DIAGNOSIS — Z79899 Other long term (current) drug therapy: Secondary | ICD-10-CM | POA: Insufficient documentation

## 2014-11-13 DIAGNOSIS — R220 Localized swelling, mass and lump, head: Secondary | ICD-10-CM | POA: Diagnosis present

## 2014-11-13 DIAGNOSIS — Y998 Other external cause status: Secondary | ICD-10-CM | POA: Insufficient documentation

## 2014-11-13 LAB — I-STAT CHEM 8, ED
BUN: 10 mg/dL (ref 6–23)
CALCIUM ION: 1.13 mmol/L (ref 1.12–1.23)
Chloride: 103 mmol/L (ref 96–112)
Creatinine, Ser: 0.7 mg/dL (ref 0.50–1.10)
Glucose, Bld: 84 mg/dL (ref 70–99)
HEMATOCRIT: 42 % (ref 36.0–46.0)
Hemoglobin: 14.3 g/dL (ref 12.0–15.0)
Potassium: 3.7 mmol/L (ref 3.5–5.1)
Sodium: 143 mmol/L (ref 135–145)
TCO2: 25 mmol/L (ref 0–100)

## 2014-11-13 MED ORDER — METHYLPREDNISOLONE SODIUM SUCC 125 MG IJ SOLR
125.0000 mg | Freq: Once | INTRAMUSCULAR | Status: AC
  Administered 2014-11-13: 125 mg via INTRAVENOUS
  Filled 2014-11-13: qty 2

## 2014-11-13 MED ORDER — FAMOTIDINE 20 MG PO TABS: 20.0000 mg | ORAL_TABLET | Freq: Two times a day (BID) | ORAL | Status: DC

## 2014-11-13 MED ORDER — DIPHENHYDRAMINE HCL 50 MG/ML IJ SOLN
25.0000 mg | Freq: Once | INTRAMUSCULAR | Status: AC
  Administered 2014-11-13: 25 mg via INTRAVENOUS
  Filled 2014-11-13: qty 1

## 2014-11-13 MED ORDER — PREDNISONE 20 MG PO TABS: ORAL_TABLET | ORAL | Status: DC

## 2014-11-13 MED ORDER — FAMOTIDINE IN NACL 20-0.9 MG/50ML-% IV SOLN
20.0000 mg | Freq: Once | INTRAVENOUS | Status: AC
  Administered 2014-11-13: 20 mg via INTRAVENOUS
  Filled 2014-11-13: qty 50

## 2014-11-13 NOTE — Discharge Instructions (Signed)
Take benadryl 25 mg every 4 hours for swelling or itching.   Stop your norvasc and follow up with your md this week.

## 2014-11-13 NOTE — ED Provider Notes (Signed)
CSN: [id number]     Arrival date & time 11/13/14  1811 History  This chart was scribed for Michelle Diego, MD by Tula Nakayama, ED Scribe. This patient was seen in room APA08/APA08 and the patient's care was started at [facility] PM.    Chief Complaint  Patient presents with  . Oral Swelling   The history is provided by the patient. No language interpreter was used.    HPI Comments: Michelle David is a 54 y.o. female with a history of HTN who presents to the Emergency Department complaining of constant lower lip swelling that started 45 minutes ago. She has not tried any treatment for her symptoms. Pt denies a history of similar swelling. She takes Catapres and Maxzide-25 for HTN. Pt denies pain and itching as associated symptoms.   Past Medical History  Diagnosis Date  . Essential hypertension, benign   . Impaired glucose tolerance   . Dyspepsia   . Obesity    Past Surgical History  Procedure Laterality Date  . Oophorectomy    . Tubal ligation     Family History  Problem Relation Age of Onset  . Heart disease Mother     Died in her 67s with MI  . Hypertension Mother    History  Substance Use Topics  . Smoking status: Never Smoker   . Smokeless tobacco: Never Used  . Alcohol Use: No   OB History    No data available     Review of Systems  Constitutional: Negative for appetite change and fatigue.  HENT: Positive for facial swelling. Negative for congestion, ear discharge and sinus pressure.   Eyes: Negative for discharge.  Respiratory: Negative for cough.   Cardiovascular: Negative for chest pain.  Gastrointestinal: Negative for abdominal pain and diarrhea.  Genitourinary: Negative for frequency and hematuria.  Musculoskeletal: Negative for back pain.  Skin: Negative for rash.  Neurological: Negative for seizures and headaches.  Psychiatric/Behavioral: Negative for hallucinations.    Allergies  Review of patient's allergies indicates no known allergies.  Home  Medications   Prior to Admission medications   Medication Sig Start Date End Date Taking? Authorizing Provider  amLODipine (NORVASC) 10 MG tablet Take 1 tablet (10 mg total) by mouth daily. 03/08/14 03/08/15  Fayrene Helper, MD  cloNIDine (CATAPRES) 0.3 MG tablet Take 1 tablet (0.3 mg total) by mouth at bedtime. 06/12/14   Fayrene Helper, MD  fluticasone (FLONASE) 50 MCG/ACT nasal spray Place 2 sprays into both nostrils daily. 03/07/14   Fayrene Helper, MD  phentermine (ADIPEX-P) 37.5 MG tablet Take 1 tablet (37.5 mg total) by mouth daily before breakfast. 06/12/14   Fayrene Helper, MD  triamterene-hydrochlorothiazide (MAXZIDE-25) 37.5-25 MG per tablet Take 1 tablet by mouth daily. 03/08/14 03/08/15  Fayrene Helper, MD   BP 172/98 mmHg  Pulse 86  Temp(Src) 98.1 F (36.7 C)  Resp 18  Ht 5\' 2"  (1.575 m)  Wt 228 lb (103.42 kg)  BMI 41.69 kg/m2  SpO2 100% Physical Exam  Constitutional: She is oriented to person, place, and time. She appears well-developed.  HENT:  Head: Normocephalic.  Mild swelling to the left side of her lower lip  Eyes: Conjunctivae are normal.  Neck: No tracheal deviation present.  Cardiovascular:  No murmur heard. Musculoskeletal: Normal range of motion.  Neurological: She is oriented to person, place, and time.  Skin: Skin is warm.  Psychiatric: She has a normal mood and affect.  Nursing note and vitals reviewed.  ED Course  Procedures (including critical care time) DIAGNOSTIC STUDIES: Oxygen Saturation is 100% on RA, normal by my interpretation.    COORDINATION OF CARE: 6:46 PM Discussed treatment plan with pt which includes lab work. Pt agreed to plan.   Labs Review Labs Reviewed - No data to display  Imaging Review No results found.   EKG Interpretation None      MDM   Final diagnoses:  None    Allergic reaction probably 2nd to norvasc.   Will stop norvasc and follow up with pcp   The chart was scribed for me under my  direct supervision.  I personally performed the history, physical, and medical decision making and all procedures in the evaluation of this patient.Michelle Diego, MD 11/13/14 2230

## 2014-11-13 NOTE — ED Notes (Signed)
Patient resting in bed; family member at bedside.  Patient denies any pain at this time.  Patient's lower lip on left side still remains swollen.

## 2014-11-13 NOTE — ED Notes (Signed)
Pt c/o left lower lip swelling x 30 minutes ago. Speech normal, denies sob/cp.

## 2014-11-21 ENCOUNTER — Ambulatory Visit: Payer: Self-pay | Admitting: Family Medicine

## 2014-11-22 ENCOUNTER — Encounter: Payer: Self-pay | Admitting: Family Medicine

## 2014-11-22 ENCOUNTER — Ambulatory Visit: Payer: Self-pay | Admitting: Family Medicine

## 2015-03-01 ENCOUNTER — Ambulatory Visit (INDEPENDENT_AMBULATORY_CARE_PROVIDER_SITE_OTHER): Payer: BLUE CROSS/BLUE SHIELD | Admitting: Family Medicine

## 2015-03-01 ENCOUNTER — Encounter: Payer: Self-pay | Admitting: Family Medicine

## 2015-03-01 VITALS — BP 170/100 | HR 76 | Resp 18 | Ht 62.0 in | Wt 224.1 lb

## 2015-03-01 DIAGNOSIS — Z1239 Encounter for other screening for malignant neoplasm of breast: Secondary | ICD-10-CM | POA: Diagnosis not present

## 2015-03-01 DIAGNOSIS — E049 Nontoxic goiter, unspecified: Secondary | ICD-10-CM

## 2015-03-01 DIAGNOSIS — J302 Other seasonal allergic rhinitis: Secondary | ICD-10-CM

## 2015-03-01 DIAGNOSIS — R4689 Other symptoms and signs involving appearance and behavior: Secondary | ICD-10-CM

## 2015-03-01 DIAGNOSIS — R7303 Prediabetes: Secondary | ICD-10-CM

## 2015-03-01 DIAGNOSIS — E785 Hyperlipidemia, unspecified: Secondary | ICD-10-CM

## 2015-03-01 DIAGNOSIS — I10 Essential (primary) hypertension: Secondary | ICD-10-CM

## 2015-03-01 DIAGNOSIS — M25561 Pain in right knee: Secondary | ICD-10-CM | POA: Diagnosis not present

## 2015-03-01 DIAGNOSIS — E041 Nontoxic single thyroid nodule: Secondary | ICD-10-CM | POA: Insufficient documentation

## 2015-03-01 DIAGNOSIS — E8881 Metabolic syndrome: Secondary | ICD-10-CM

## 2015-03-01 DIAGNOSIS — R7309 Other abnormal glucose: Secondary | ICD-10-CM

## 2015-03-01 DIAGNOSIS — R109 Unspecified abdominal pain: Secondary | ICD-10-CM

## 2015-03-01 HISTORY — DX: Nontoxic single thyroid nodule: E04.1

## 2015-03-01 LAB — POCT URINALYSIS DIPSTICK
Bilirubin, UA: NEGATIVE
Glucose, UA: NEGATIVE
Ketones, UA: NEGATIVE
Leukocytes, UA: NEGATIVE
Nitrite, UA: NEGATIVE
PH UA: 6
PROTEIN UA: NEGATIVE
Spec Grav, UA: 1.025
UROBILINOGEN UA: 0.2

## 2015-03-01 MED ORDER — IBUPROFEN 800 MG PO TABS: 800.0000 mg | ORAL_TABLET | Freq: Three times a day (TID) | ORAL | Status: DC

## 2015-03-01 MED ORDER — AMLODIPINE BESYLATE 10 MG PO TABS: 10.0000 mg | ORAL_TABLET | Freq: Every day | ORAL | Status: DC

## 2015-03-01 MED ORDER — ACETAMINOPHEN 500 MG PO TABS: ORAL_TABLET | ORAL | Status: DC

## 2015-03-01 MED ORDER — TRIAMTERENE-HCTZ 37.5-25 MG PO TABS: 1.0000 | ORAL_TABLET | Freq: Every day | ORAL | Status: DC

## 2015-03-01 NOTE — Assessment & Plan Note (Addendum)
Right knee pain and buckling progressively worsening in past 6 months, present for over 3 , needs ortho eval, referral entered  Weight loss advised Tylenol for pain

## 2015-03-01 NOTE — Patient Instructions (Signed)
CPE in 12 weeks, call if you need me before  Nurse BP check in 3 weeks, if good you will get script for weight loss  CBc, fasting lipid, cmp and EGFr, hBA1C and TSh  STOP SODAS, drink WATER, put lemon in if needed, or strawberry, nO SUGAR!!  You are referred for mammogram and thyroid US  You are referred to Dr Aline Brochure re right knee  In June per your request  Urine is not infected

## 2015-03-01 NOTE — Assessment & Plan Note (Signed)
Increased symptoms in past 6 weeks responds to zyrtec as needed

## 2015-03-02 ENCOUNTER — Other Ambulatory Visit: Payer: Self-pay | Admitting: Family Medicine

## 2015-03-02 DIAGNOSIS — Z1231 Encounter for screening mammogram for malignant neoplasm of breast: Secondary | ICD-10-CM

## 2015-03-02 LAB — COMPLETE METABOLIC PANEL WITH GFR
ALK PHOS: 68 U/L (ref 39–117)
ALT: 17 U/L (ref 0–35)
AST: 14 U/L (ref 0–37)
Albumin: 3.6 g/dL (ref 3.5–5.2)
BILIRUBIN TOTAL: 0.3 mg/dL (ref 0.2–1.2)
BUN: 12 mg/dL (ref 6–23)
CALCIUM: 8.8 mg/dL (ref 8.4–10.5)
CO2: 27 meq/L (ref 19–32)
CREATININE: 0.67 mg/dL (ref 0.50–1.10)
Chloride: 103 mEq/L (ref 96–112)
GLUCOSE: 74 mg/dL (ref 70–99)
Potassium: 3.7 mEq/L (ref 3.5–5.3)
Sodium: 140 mEq/L (ref 135–145)
TOTAL PROTEIN: 8 g/dL (ref 6.0–8.3)

## 2015-03-02 LAB — CBC WITH DIFFERENTIAL/PLATELET
BASOS ABS: 0 10*3/uL (ref 0.0–0.1)
BASOS PCT: 0 % (ref 0–1)
Eosinophils Absolute: 0.2 10*3/uL (ref 0.0–0.7)
Eosinophils Relative: 3 % (ref 0–5)
HCT: 38.9 % (ref 36.0–46.0)
HEMOGLOBIN: 12.8 g/dL (ref 12.0–15.0)
LYMPHS ABS: 2.1 10*3/uL (ref 0.7–4.0)
Lymphocytes Relative: 41 % (ref 12–46)
MCH: 29.2 pg (ref 26.0–34.0)
MCHC: 32.9 g/dL (ref 30.0–36.0)
MCV: 88.6 fL (ref 78.0–100.0)
MPV: 8.6 fL (ref 8.6–12.4)
Monocytes Absolute: 0.3 10*3/uL (ref 0.1–1.0)
Monocytes Relative: 6 % (ref 3–12)
NEUTROS ABS: 2.6 10*3/uL (ref 1.7–7.7)
Neutrophils Relative %: 50 % (ref 43–77)
Platelets: 388 10*3/uL (ref 150–400)
RBC: 4.39 MIL/uL (ref 3.87–5.11)
RDW: 15.2 % (ref 11.5–15.5)
WBC: 5.2 10*3/uL (ref 4.0–10.5)

## 2015-03-02 LAB — LIPID PANEL
CHOLESTEROL: 182 mg/dL (ref 0–200)
HDL: 49 mg/dL (ref >=46)
LDL Cholesterol: 118 mg/dL — ABNORMAL HIGH (ref 0–99)
Total CHOL/HDL Ratio: 3.7 Ratio
Triglycerides: 75 mg/dL (ref <150)
VLDL: 15 mg/dL (ref 0–40)

## 2015-03-02 LAB — HEMOGLOBIN A1C
HEMOGLOBIN A1C: 5.4 % (ref <5.7)
Mean Plasma Glucose: 108 mg/dL (ref <117)

## 2015-03-02 LAB — TSH: TSH: 1.403 u[IU]/mL (ref 0.350–4.500)

## 2015-03-03 DIAGNOSIS — R109 Unspecified abdominal pain: Secondary | ICD-10-CM | POA: Insufficient documentation

## 2015-03-03 NOTE — Assessment & Plan Note (Signed)
Mild dysuria with flank pain, UA negative for infection

## 2015-03-03 NOTE — Assessment & Plan Note (Signed)
Uncontrolled and off medication  For approx 5 months, for no reason  Re educated re danger of this behavior Medication resumed, nurse BP check in 2 to 4 weeks DASH diet and commitment to daily physical activity for a minimum of 30 minutes discussed and encouraged, as a part of hypertension management. The importance of attaining a healthy weight is also discussed.  BP/Weight 03/01/2015 11/13/2014 06/12/2014 03/07/2014 05/26/2013 9/51/8841 03/25/629  Systolic BP 160 109 323 557 322 025 427  Diastolic BP 062 93 86 376 100 102 100  Wt. (Lbs) 224.08 228 228.04 222.4 221.04 224 225  BMI 40.97 41.69 41.7 40.67 40.42 40.96 41.14

## 2015-03-03 NOTE — Assessment & Plan Note (Signed)
Positive f/h of thyroid disease and enlarged gland on exam, imaging study ordered

## 2015-03-03 NOTE — Assessment & Plan Note (Signed)
Deteriorated. Patient re-educated about  the importance of commitment to a  minimum of 150 minutes of exercise per week.  The importance of healthy food choices with portion control discussed. Encouraged to start a food diary, count calories and to consider  joining a support group. Sample diet sheets offered. Goals set by the patient for the next several months.   Weight /BMI 03/01/2015 11/13/2014 06/12/2014  WEIGHT 224 lb 1.3 oz 228 lb 228 lb 0.6 oz  HEIGHT 5\' 2"  5\' 2"  -  BMI 40.97 kg/m2 41.69 kg/m2 41.7 kg/m2    Current exercise per week **60* minutes. Wil;l prescribe phentermine once BP is normal

## 2015-03-03 NOTE — Assessment & Plan Note (Signed)
Remains and ongoing challenge as pt has multiple co morbidities which could be improved /elinminated if she were consistent in her approach to her health Re education done at visit

## 2015-03-03 NOTE — Assessment & Plan Note (Signed)
Deteriorated 4 Hyperlipidemia:Low fat diet discussed and encouraged.   Lipid Panel  Lab Results  Component Value Date   CHOL 182 03/01/2015   HDL 49 03/01/2015   LDLCALC 118* 03/01/2015   TRIG 75 03/01/2015   CHOLHDL 3.7 03/01/2015

## 2015-03-03 NOTE — Progress Notes (Signed)
Subjective:    Patient ID: Michelle David, female    DOB: 10-Oct-1961, 54 y.o.   MRN: [id number]  HPI Pt is in for f/u having not been seen fpor over 6 months, despite chronic uncontrolled health conditions. States she has been on o prescription med for over 5 months and denied financial burdens as being the issue C/o increased weight ain causing depression wants help. C/o increased rigth kne pain and instability in past 3 to 5 months, o falls, but buckles C/o flank pain with mild dysura x 3 days Cancer screening is behind sa and she wi;ll work on this gradually, states copays are too high   Review of Systems See HPI Denies recent fever or chills. Denies sinus pressure, nasal congestion, ear pain or sore throat. Denies chest congestion, productive cough or wheezing. Denies chest pains, palpitations and leg swelling Denies abdominal pain, nausea, vomiting,diarrhea or constipation.    Denies headaches, seizures, numbness, or tingling.  Denies skin break down or rash.        Objective:   Physical Exam BP 170/100 mmHg  Pulse 76  Resp 18  Ht 5\' 2"  (1.575 m)  Wt 224 lb 1.3 oz (101.642 kg)  BMI 40.97 kg/m2  SpO2 98% Patient alert and oriented and in no cardiopulmonary distress.  HEENT: No facial asymmetry, EOMI,   oropharynx pink and moist.  Neck supple no JVD,no lymphadenopathy, goiter present.  Chest: Clear to auscultation bilaterally.  CVS: S1, S2 no murmurs, no S3.Regular rate.  ABD: Soft non tender.   Ext: No edema  MS: Adequate ROM spine, shoulders, hips and reduced in right  Knee with anterior tenderness.  Skin: Intact, no ulcerations or rash noted.  Psych: Good eye contact, normal affect. Memory intact not anxious or depressed appearing.  CNS: CN 2-12 intact, power,  normal throughout.no focal deficits noted.        Assessment & Plan:  Knee pain, right Right knee pain and buckling progressively worsening in past 6 months, present for over 3 ,  needs ortho eval, referral entered  Weight loss advised Tylenol for pain   Seasonal allergies Increased symptoms in past 6 weeks responds to zyrtec as needed   HTN (hypertension), malignant Uncontrolled and off medication  For approx 5 months, for no reason  Re educated re danger of this behavior Medication resumed, nurse BP check in 2 to 4 weeks DASH diet and commitment to daily physical activity for a minimum of 30 minutes discussed and encouraged, as a part of hypertension management. The importance of attaining a healthy weight is also discussed.  BP/Weight 03/01/2015 11/13/2014 06/12/2014 03/07/2014 05/26/2013 3/78/5885 0/11/7739  Systolic BP 287 867 672 094 709 628 366  Diastolic BP 294 93 86 765 100 102 100  Wt. (Lbs) 224.08 228 228.04 222.4 221.04 224 225  BMI 40.97 41.69 41.7 40.67 40.42 40.96 41.14         Morbid obesity Deteriorated. Patient re-educated about  the importance of commitment to a  minimum of 150 minutes of exercise per week.  The importance of healthy food choices with portion control discussed. Encouraged to start a food diary, count calories and to consider  joining a support group. Sample diet sheets offered. Goals set by the patient for the next several months.   Weight /BMI 03/01/2015 11/13/2014 06/12/2014  WEIGHT 224 lb 1.3 oz 228 lb 228 lb 0.6 oz  HEIGHT 5\' 2"  5\' 2"  -  BMI 40.97 kg/m2 41.69 kg/m2 41.7 kg/m2  Current exercise per week **60* minutes. Wil;l prescribe phentermine once BP is normal   Metabolic syndrome X The increased risk of cardiovascular disease associated with this diagnosis, and the need to consistently work on lifestyle to change this is discussed. Following  a  heart healthy diet ,commitment to 30 minutes of exercise at least 5 days per week, as well as control of blood sugar and cholesterol , and achieving a healthy weight are all the areas to be addressed .    Prediabetes Improved HBa1C despite weight gain, which is  good Patient educated about the importance of limiting  Carbohydrate intake , the need to commit to daily physical activity for a minimum of 30 minutes , and to commit weight loss. The fact that changes in all these areas will reduce or eliminate all together the development of diabetes is stressed.   Diabetic Labs Latest Ref Rng 03/01/2015 11/13/2014 03/07/2014 01/26/2012 06/02/2011  HbA1c <5.7 % 5.4 - 5.3 5.7(H) 5.6  Chol 0 - 200 mg/dL 182 - 177 176 198  HDL >=46 mg/dL 49 - 50 54 49  Calc LDL 0 - 99 mg/dL 118(H) - 112(H) 110(H) 135(H)  Triglycerides <150 mg/dL 75 - 73 58 68  Creatinine 0.50 - 1.10 mg/dL 0.67 0.70 0.91 0.66 0.87   BP/Weight 03/01/2015 11/13/2014 06/12/2014 03/07/2014 05/26/2013 8/93/7342 05/26/6810  Systolic BP 572 620 355 974 163 845 364  Diastolic BP 680 93 86 321 100 102 100  Wt. (Lbs) 224.08 228 228.04 222.4 221.04 224 225  BMI 40.97 41.69 41.7 40.67 40.42 40.96 41.14   No flowsheet data found.      Dyslipidemia Deteriorated 4 Hyperlipidemia:Low fat diet discussed and encouraged.   Lipid Panel  Lab Results  Component Value Date   CHOL 182 03/01/2015   HDL 49 03/01/2015   LDLCALC 118* 03/01/2015   TRIG 75 03/01/2015   CHOLHDL 3.7 03/01/2015         Non-compliant behavior Remains and ongoing challenge as pt has multiple co morbidities which could be improved /elinminated if she were consistent in her approach to her health Re education done at visit   Flank pain Mild dysuria with flank pain, UA negative for infection   Goiter Positive f/h of thyroid disease and enlarged gland on exam, imaging study ordered

## 2015-03-03 NOTE — Assessment & Plan Note (Signed)
Improved HBa1C despite weight gain, which is good Patient educated about the importance of limiting  Carbohydrate intake , the need to commit to daily physical activity for a minimum of 30 minutes , and to commit weight loss. The fact that changes in all these areas will reduce or eliminate all together the development of diabetes is stressed.   Diabetic Labs Latest Ref Rng 03/01/2015 11/13/2014 03/07/2014 01/26/2012 06/02/2011  HbA1c <5.7 % 5.4 - 5.3 5.7(H) 5.6  Chol 0 - 200 mg/dL 182 - 177 176 198  HDL >=46 mg/dL 49 - 50 54 49  Calc LDL 0 - 99 mg/dL 118(H) - 112(H) 110(H) 135(H)  Triglycerides <150 mg/dL 75 - 73 58 68  Creatinine 0.50 - 1.10 mg/dL 0.67 0.70 0.91 0.66 0.87   BP/Weight 03/01/2015 11/13/2014 06/12/2014 03/07/2014 05/26/2013 04/12/4694 0/04/2256  Systolic BP 505 183 358 251 898 421 031  Diastolic BP 281 93 86 188 100 102 100  Wt. (Lbs) 224.08 228 228.04 222.4 221.04 224 225  BMI 40.97 41.69 41.7 40.67 40.42 40.96 41.14   No flowsheet data found.

## 2015-03-03 NOTE — Assessment & Plan Note (Signed)
The increased risk of cardiovascular disease associated with this diagnosis, and the need to consistently work on lifestyle to change this is discussed. Following  a  heart healthy diet ,commitment to 30 minutes of exercise at least 5 days per week, as well as control of blood sugar and cholesterol , and achieving a healthy weight are all the areas to be addressed .  

## 2015-03-07 ENCOUNTER — Ambulatory Visit (HOSPITAL_COMMUNITY): Admission: RE | Admit: 2015-03-07 | Payer: BLUE CROSS/BLUE SHIELD | Source: Ambulatory Visit

## 2015-03-07 ENCOUNTER — Ambulatory Visit: Payer: BLUE CROSS/BLUE SHIELD

## 2015-03-07 VITALS — BP 140/88 | Wt 224.4 lb

## 2015-03-07 DIAGNOSIS — I10 Essential (primary) hypertension: Secondary | ICD-10-CM

## 2015-03-07 MED ORDER — TRIAMTERENE-HCTZ 37.5-25 MG PO TABS: 1.5000 | ORAL_TABLET | Freq: Every day | ORAL | Status: DC

## 2015-03-07 MED ORDER — PHENTERMINE HCL 37.5 MG PO TABS: 37.5000 mg | ORAL_TABLET | Freq: Every day | ORAL | Status: DC

## 2015-03-07 NOTE — Progress Notes (Signed)
Increased maxzide to 1 1/2 tabs daily and pt aware to continue the amlodipine.

## 2015-03-07 NOTE — Patient Instructions (Signed)
   Increase Triamterene 37.5/25 to 1 1/2 tabs daily and continue the Amlodipine 10mg .  RX for phentermine sent today as well. Take 1 tab daily

## 2015-03-09 ENCOUNTER — Encounter: Payer: Self-pay | Admitting: Orthopedic Surgery

## 2015-03-12 ENCOUNTER — Ambulatory Visit (HOSPITAL_COMMUNITY)
Admission: RE | Admit: 2015-03-12 | Discharge: 2015-03-12 | Disposition: A | Discharge status: Home / Self Care | Payer: BLUE CROSS/BLUE SHIELD | Source: Ambulatory Visit | Attending: Family Medicine | Admitting: Family Medicine

## 2015-03-12 ENCOUNTER — Telehealth: Payer: Self-pay | Admitting: *Deleted

## 2015-03-12 DIAGNOSIS — E049 Nontoxic goiter, unspecified: Secondary | ICD-10-CM

## 2015-03-12 NOTE — Telephone Encounter (Signed)
Pt called requesting cheaper weight pills, pt is requesting a call back. Please advise

## 2015-03-12 NOTE — Telephone Encounter (Signed)
PA has been completed on her med and patient is aware we are just awaiting the outcome

## 2015-03-15 ENCOUNTER — Other Ambulatory Visit: Payer: Self-pay

## 2015-03-15 DIAGNOSIS — E041 Nontoxic single thyroid nodule: Secondary | ICD-10-CM

## 2015-03-16 ENCOUNTER — Encounter: Payer: Self-pay | Admitting: Family Medicine

## 2015-03-27 ENCOUNTER — Ambulatory Visit (HOSPITAL_COMMUNITY): Payer: BLUE CROSS/BLUE SHIELD

## 2015-04-09 ENCOUNTER — Ambulatory Visit (HOSPITAL_COMMUNITY): Payer: BLUE CROSS/BLUE SHIELD

## 2015-04-19 ENCOUNTER — Ambulatory Visit (INDEPENDENT_AMBULATORY_CARE_PROVIDER_SITE_OTHER): Payer: BLUE CROSS/BLUE SHIELD | Admitting: Otolaryngology

## 2015-06-06 ENCOUNTER — Ambulatory Visit: Payer: BLUE CROSS/BLUE SHIELD | Admitting: Family Medicine

## 2015-06-12 ENCOUNTER — Encounter: Payer: Self-pay | Admitting: *Deleted

## 2015-06-12 ENCOUNTER — Ambulatory Visit: Payer: BLUE CROSS/BLUE SHIELD | Admitting: Family Medicine

## 2015-07-04 ENCOUNTER — Telehealth: Payer: Self-pay | Admitting: *Deleted

## 2015-07-04 ENCOUNTER — Encounter: Payer: Self-pay | Admitting: Family Medicine

## 2015-07-04 ENCOUNTER — Ambulatory Visit (INDEPENDENT_AMBULATORY_CARE_PROVIDER_SITE_OTHER): Payer: BLUE CROSS/BLUE SHIELD | Admitting: Family Medicine

## 2015-07-04 VITALS — BP 160/104 | HR 94 | Temp 97.9°F | Resp 16 | Ht 62.0 in | Wt 224.0 lb

## 2015-07-04 DIAGNOSIS — R7309 Other abnormal glucose: Secondary | ICD-10-CM | POA: Diagnosis not present

## 2015-07-04 DIAGNOSIS — Z1231 Encounter for screening mammogram for malignant neoplasm of breast: Secondary | ICD-10-CM

## 2015-07-04 DIAGNOSIS — E041 Nontoxic single thyroid nodule: Secondary | ICD-10-CM

## 2015-07-04 DIAGNOSIS — Z23 Encounter for immunization: Secondary | ICD-10-CM

## 2015-07-04 DIAGNOSIS — E8881 Metabolic syndrome: Secondary | ICD-10-CM

## 2015-07-04 DIAGNOSIS — E785 Hyperlipidemia, unspecified: Secondary | ICD-10-CM

## 2015-07-04 DIAGNOSIS — I10 Essential (primary) hypertension: Secondary | ICD-10-CM | POA: Diagnosis not present

## 2015-07-04 DIAGNOSIS — Z1211 Encounter for screening for malignant neoplasm of colon: Secondary | ICD-10-CM

## 2015-07-04 DIAGNOSIS — R7303 Prediabetes: Secondary | ICD-10-CM

## 2015-07-04 DIAGNOSIS — Z1159 Encounter for screening for other viral diseases: Secondary | ICD-10-CM

## 2015-07-04 DIAGNOSIS — J302 Other seasonal allergic rhinitis: Secondary | ICD-10-CM

## 2015-07-04 LAB — POC HEMOCCULT BLD/STL (OFFICE/1-CARD/DIAGNOSTIC): Fecal Occult Blood, POC: NEGATIVE

## 2015-07-04 MED ORDER — TRIAMTERENE-HCTZ 37.5-25 MG PO TABS: 1.5000 | ORAL_TABLET | Freq: Every day | ORAL | Status: DC

## 2015-07-04 MED ORDER — CLONIDINE HCL 0.1 MG PO TABS: ORAL_TABLET | ORAL | Status: DC

## 2015-07-04 MED ORDER — AMLODIPINE BESYLATE 10 MG PO TABS: 10.0000 mg | ORAL_TABLET | Freq: Every day | ORAL | Status: DC

## 2015-07-04 NOTE — Telephone Encounter (Signed)
Noted  

## 2015-07-04 NOTE — Telephone Encounter (Signed)
Pt is scheduled for christine the PA at Dr. Deeann Saint office in [place] 07/26/15 at 9:40. Pt is also scheduled for her mammogram at [facility] for 07/12/15 at 9:30 arrive at 9:15 to register. I tried to call patient to make her aware no answer LMOM.

## 2015-07-04 NOTE — Patient Instructions (Signed)
Cancel September appointment  Nurse BP check the week of October 17  MD follow up in early December  BP is high, You need to take ALL medications at the SAME time [place] as prescribed, new additional med is clonidine one at bedtime, take the other 2 you have been prescribed    Rectal exam and flu vaccine today  You are referred for mammogram and again to see the ENT Doc for biopsy of your thyroid gland, since there is a nodule there. IT IS VITAL that you keep the appt., call fior appointment info on Friday if you have not heard from the office  Non fast hep C, HIV and chem 7 early December

## 2015-07-04 NOTE — Progress Notes (Signed)
Subjective:    Patient ID: Michelle David, female    DOB: 1961/09/27, 54 y.o.   MRN: [id number]  HPI   Michelle David     MRN: [id number]      DOB: Dec 11, 1960   HPI [name] is here for follow up and re-evaluation of chronic medical conditions, medication management and review of any available recent lab and radiology data.  Preventive health is updated, specifically  Cancer screening and Immunization.   ENT eval still outstanding, now ready to follow through The PT denies any adverse reactions to current medications since the last visit.     ROS Denies recent fever or chills. Denies sinus pressure,c/o  nasal congestion,with clear drainage, denies  ear pain or sore throat.Wants thyroid gland checked is aware that a biopsy had been recommended Denies chest congestion, productive cough or wheezing. Denies chest pains, palpitations and leg swelling Denies abdominal pain, nausea, vomiting,diarrhea or constipation.   Denies dysuria, frequency, hesitancy or incontinence. Denies joint pain, swelling and limitation in mobility. Denies headaches, seizures, numbness, or tingling. Denies depression, anxiety or insomnia. Denies skin break down or rash.   PE  BP 160/104 mmHg  Pulse 94  Temp(Src) 97.9 F (36.6 C) (Oral)  Resp 16  Ht 5\' 2"  (1.575 m)  Wt 224 lb (101.606 kg)  BMI 40.96 kg/m2  SpO2 94%  Patient alert and oriented and in no cardiopulmonary distress.  HEENT: No facial asymmetry, EOMI,   oropharynx pink and moist.  Neck supple no JVD,thyroid nodule   Chest: Clear to auscultation bilaterally.  CVS: S1, S2 no murmurs, no S3.Regular rate.  ABD: Soft non tender. No organomegaly or mass Rectal:Heme negative stool,  No mass  Ext: No edema  MS: Adequate ROM spine, shoulders, hips and knees.  Skin: Intact, no ulcerations or rash noted.  Psych: Good eye contact, normal affect. Memory intact not anxious or depressed appearing.  CNS: CN 2-12 intact, power,   normal throughout.no focal deficits noted.   Assessment & Plan   HTN (hypertension), malignant Uncontrolled due to non compliance, takes medication irregularly to "make it stretch" and has not been keeping f/u appts Re educated re need to chane both Clonidine addeded DASH diet and commitment to daily physical activity for a minimum of 30 minutes discussed and encouraged, as a part of hypertension management. The importance of attaining a healthy weight is also discussed.  BP/Weight 07/04/2015 03/07/2015 03/01/2015 11/13/2014 06/12/2014 9/98/3382 5/0/5397  Systolic BP 673 419 379 024 097 353 299  Diastolic BP 242 88 683 93 86 100 100  Wt. (Lbs) 224 224.4 224.08 228 228.04 222.4 221.04  BMI 40.96 41.03 40.97 41.69 41.7 40.67 40.42      BP check in 4 weeks  Solitary thyroid nodule Biopsy recommended, pt now ready to follow through  Morbid obesity Unchanged. Patient re-educated about  the importance of commitment to a  minimum of 150 minutes of exercise per week.  The importance of healthy food choices with portion control discussed. Encouraged to start a food diary, count calories and to consider  joining a support group. Sample diet sheets offered. Goals set by the patient for the next several months.   Weight /BMI 07/04/2015 03/07/2015 03/01/2015  WEIGHT 224 lb 224 lb 6.4 oz 224 lb 1.3 oz  HEIGHT 5\' 2"  - 5\' 2"   BMI 40.96 kg/m2 41.03 kg/m2 40.97 kg/m2    Current exercise per week 90 minutes.   Hyperlipidemia LDL goal <100 Hyperlipidemia:Low fat diet discussed and  encouraged.   Lipid Panel  Lab Results  Component Value Date   CHOL 182 03/01/2015   HDL 49 03/01/2015   LDLCALC 118* 03/01/2015   TRIG 75 03/01/2015   CHOLHDL 3.7 03/01/2015  Updated lab needed at/ before next visit.      Metabolic syndrome X The increased risk of cardiovascular disease associated with this diagnosis, and the need to consistently work on lifestyle to change this is discussed. Following   a  heart healthy diet ,commitment to 30 minutes of exercise at least 5 days per week, as well as control of blood sugar and cholesterol , and achieving a healthy weight are all the areas to be addressed .   Prediabetes Patient educated about the importance of limiting  Carbohydrate intake , the need to commit to daily physical activity for a minimum of 30 minutes , and to commit weight loss. The fact that changes in all these areas will reduce or eliminate all together the development of diabetes is stressed.  Updated lab needed at/ before next visit.   Diabetic Labs Latest Ref Rng 03/01/2015 11/13/2014 03/07/2014 01/26/2012 06/02/2011  HbA1c <5.7 % 5.4 - 5.3 5.7(H) 5.6  Chol 0 - 200 mg/dL 182 - 177 176 198  HDL >=46 mg/dL 49 - 50 54 49  Calc LDL 0 - 99 mg/dL 118(H) - 112(H) 110(H) 135(H)  Triglycerides <150 mg/dL 75 - 73 58 68  Creatinine 0.50 - 1.10 mg/dL 0.67 0.70 0.91 0.66 0.87   BP/Weight 07/04/2015 03/07/2015 03/01/2015 11/13/2014 06/12/2014 5/00/9381 05/21/9936  Systolic BP 169 678 938 101 751 025 852  Diastolic BP 778 88 242 93 86 100 100  Wt. (Lbs) 224 224.4 224.08 228 228.04 222.4 221.04  BMI 40.96 41.03 40.97 41.69 41.7 40.67 40.42   No flowsheet data found.     Need for prophylactic vaccination and inoculation against influenza After obtaining informed consent, the vaccine is  administered by LPN.    Seasonal allergies Current flare, OTC medication effective      Review of Systems     Objective:   Physical Exam        Assessment & Plan:

## 2015-07-04 NOTE — Telephone Encounter (Signed)
Patient's husband called back and confirmed the appts for Michelle David.

## 2015-07-09 DIAGNOSIS — Z23 Encounter for immunization: Secondary | ICD-10-CM | POA: Insufficient documentation

## 2015-07-09 NOTE — Assessment & Plan Note (Signed)
Current flare, OTC medication effective

## 2015-07-09 NOTE — Assessment & Plan Note (Signed)
Patient educated about the importance of limiting  Carbohydrate intake , the need to commit to daily physical activity for a minimum of 30 minutes , and to commit weight loss. The fact that changes in all these areas will reduce or eliminate all together the development of diabetes is stressed.  Updated lab needed at/ before next visit.   Diabetic Labs Latest Ref Rng 03/01/2015 11/13/2014 03/07/2014 01/26/2012 06/02/2011  HbA1c <5.7 % 5.4 - 5.3 5.7(H) 5.6  Chol 0 - 200 mg/dL 182 - 177 176 198  HDL >=46 mg/dL 49 - 50 54 49  Calc LDL 0 - 99 mg/dL 118(H) - 112(H) 110(H) 135(H)  Triglycerides <150 mg/dL 75 - 73 58 68  Creatinine 0.50 - 1.10 mg/dL 0.67 0.70 0.91 0.66 0.87   BP/Weight 07/04/2015 03/07/2015 03/01/2015 11/13/2014 06/12/2014 3/50/0938 10/28/2991  Systolic BP 716 967 893 810 175 102 585  Diastolic BP 277 88 824 93 86 100 100  Wt. (Lbs) 224 224.4 224.08 228 228.04 222.4 221.04  BMI 40.96 41.03 40.97 41.69 41.7 40.67 40.42   No flowsheet data found.

## 2015-07-09 NOTE — Assessment & Plan Note (Signed)
Uncontrolled due to non compliance, takes medication irregularly to "make it stretch" and has not been keeping f/u appts Re educated re need to chane both Clonidine addeded DASH diet and commitment to daily physical activity for a minimum of 30 minutes discussed and encouraged, as a part of hypertension management. The importance of attaining a healthy weight is also discussed.  BP/Weight 07/04/2015 03/07/2015 03/01/2015 11/13/2014 06/12/2014 0/13/1438 05/27/7578  Systolic BP 728 206 015 615 379 432 761  Diastolic BP 470 88 929 93 86 100 100  Wt. (Lbs) 224 224.4 224.08 228 228.04 222.4 221.04  BMI 40.96 41.03 40.97 41.69 41.7 40.67 40.42      BP check in 4 weeks

## 2015-07-09 NOTE — Assessment & Plan Note (Signed)
Unchanged. Patient re-educated about  the importance of commitment to a  minimum of 150 minutes of exercise per week.  The importance of healthy food choices with portion control discussed. Encouraged to start a food diary, count calories and to consider  joining a support group. Sample diet sheets offered. Goals set by the patient for the next several months.   Weight /BMI 07/04/2015 03/07/2015 03/01/2015  WEIGHT 224 lb 224 lb 6.4 oz 224 lb 1.3 oz  HEIGHT 5\' 2"  - 5\' 2"   BMI 40.96 kg/m2 41.03 kg/m2 40.97 kg/m2    Current exercise per week 90 minutes.

## 2015-07-09 NOTE — Assessment & Plan Note (Signed)
The increased risk of cardiovascular disease associated with this diagnosis, and the need to consistently work on lifestyle to change this is discussed. Following  a  heart healthy diet ,commitment to 30 minutes of exercise at least 5 days per week, as well as control of blood sugar and cholesterol , and achieving a healthy weight are all the areas to be addressed .  

## 2015-07-09 NOTE — Assessment & Plan Note (Signed)
After obtaining informed consent, the vaccine is  administered by LPN.  

## 2015-07-09 NOTE — Assessment & Plan Note (Signed)
Biopsy recommended, pt now ready to follow through

## 2015-07-09 NOTE — Assessment & Plan Note (Signed)
Hyperlipidemia:Low fat diet discussed and encouraged.   Lipid Panel  Lab Results  Component Value Date   CHOL 182 03/01/2015   HDL 49 03/01/2015   LDLCALC 118* 03/01/2015   TRIG 75 03/01/2015   CHOLHDL 3.7 03/01/2015  Updated lab needed at/ before next visit.

## 2015-07-12 ENCOUNTER — Ambulatory Visit (HOSPITAL_COMMUNITY)
Admission: RE | Admit: 2015-07-12 | Discharge: 2015-07-12 | Disposition: A | Discharge status: Home / Self Care | Payer: BLUE CROSS/BLUE SHIELD | Source: Ambulatory Visit | Attending: Family Medicine | Admitting: Family Medicine

## 2015-07-12 ENCOUNTER — Telehealth: Payer: Self-pay | Admitting: *Deleted

## 2015-07-12 DIAGNOSIS — Z1231 Encounter for screening mammogram for malignant neoplasm of breast: Secondary | ICD-10-CM | POA: Insufficient documentation

## 2015-07-12 NOTE — Telephone Encounter (Signed)
Ms Moline called to get her Biopsy results. Please advise [phone]

## 2015-07-12 NOTE — Telephone Encounter (Signed)
PATIENT AWARE OF HER APPT WITH ENT

## 2015-07-16 ENCOUNTER — Encounter: Payer: BLUE CROSS/BLUE SHIELD | Admitting: Family Medicine

## 2015-07-26 ENCOUNTER — Ambulatory Visit (INDEPENDENT_AMBULATORY_CARE_PROVIDER_SITE_OTHER): Payer: BLUE CROSS/BLUE SHIELD | Admitting: Otolaryngology

## 2015-07-26 ENCOUNTER — Other Ambulatory Visit (INDEPENDENT_AMBULATORY_CARE_PROVIDER_SITE_OTHER): Payer: Self-pay | Admitting: Otolaryngology

## 2015-07-26 DIAGNOSIS — D440 Neoplasm of uncertain behavior of thyroid gland: Secondary | ICD-10-CM

## 2015-07-26 DIAGNOSIS — R221 Localized swelling, mass and lump, neck: Secondary | ICD-10-CM

## 2015-08-03 ENCOUNTER — Ambulatory Visit (HOSPITAL_COMMUNITY): Admission: RE | Admit: 2015-08-03 | Payer: BLUE CROSS/BLUE SHIELD | Source: Ambulatory Visit

## 2015-08-06 ENCOUNTER — Ambulatory Visit: Payer: BLUE CROSS/BLUE SHIELD

## 2015-08-06 VITALS — BP 150/100

## 2015-08-06 DIAGNOSIS — I10 Essential (primary) hypertension: Secondary | ICD-10-CM

## 2015-08-06 NOTE — Progress Notes (Signed)
Has not been taking her meds as prescribed. Dr notified that BP is still very elevated. Patient ed given to patient on the importance of taking meds consistantly and at the same time daily. Patient understands and will come back for a BP check in 2 weeks

## 2015-08-17 ENCOUNTER — Ambulatory Visit (HOSPITAL_COMMUNITY): Payer: BLUE CROSS/BLUE SHIELD

## 2015-08-17 ENCOUNTER — Ambulatory Visit (HOSPITAL_COMMUNITY): Admission: RE | Admit: 2015-08-17 | Payer: BLUE CROSS/BLUE SHIELD | Source: Ambulatory Visit

## 2015-08-30 ENCOUNTER — Ambulatory Visit (HOSPITAL_COMMUNITY)
Admission: RE | Admit: 2015-08-30 | Discharge: 2015-08-30 | Disposition: A | Discharge status: Home / Self Care | Payer: BLUE CROSS/BLUE SHIELD | Source: Ambulatory Visit | Attending: Otolaryngology | Admitting: Otolaryngology

## 2015-08-30 DIAGNOSIS — E041 Nontoxic single thyroid nodule: Secondary | ICD-10-CM | POA: Insufficient documentation

## 2015-08-30 DIAGNOSIS — R221 Localized swelling, mass and lump, neck: Secondary | ICD-10-CM

## 2015-08-30 NOTE — Procedures (Signed)
PreOperative Dx: LEFT thyroid nodule Postoperative Dx: LEFT thyroid nodule Procedure:   US guided FNA of LEFT thyroid nodule Radiologist:  Wandell Scullion Anesthesia:  2 ml of 2% lidocaine Specimen:  FNA x 3  EBL:   < 1 ml Complications: None 

## 2015-08-30 NOTE — Discharge Instructions (Addendum)

## 2015-10-04 ENCOUNTER — Ambulatory Visit: Payer: BLUE CROSS/BLUE SHIELD | Admitting: Family Medicine

## 2015-10-04 ENCOUNTER — Ambulatory Visit (INDEPENDENT_AMBULATORY_CARE_PROVIDER_SITE_OTHER): Payer: BLUE CROSS/BLUE SHIELD | Admitting: Otolaryngology

## 2015-10-04 DIAGNOSIS — D440 Neoplasm of uncertain behavior of thyroid gland: Secondary | ICD-10-CM

## 2015-10-05 ENCOUNTER — Encounter: Payer: Self-pay | Admitting: Family Medicine

## 2015-11-21 ENCOUNTER — Encounter: Payer: Self-pay | Admitting: Family Medicine

## 2015-11-21 ENCOUNTER — Ambulatory Visit (INDEPENDENT_AMBULATORY_CARE_PROVIDER_SITE_OTHER): Payer: BLUE CROSS/BLUE SHIELD | Admitting: Family Medicine

## 2015-11-21 VITALS — BP 150/96 | HR 96 | Resp 16 | Ht 62.0 in | Wt 227.0 lb

## 2015-11-21 DIAGNOSIS — Z1159 Encounter for screening for other viral diseases: Secondary | ICD-10-CM | POA: Diagnosis not present

## 2015-11-21 DIAGNOSIS — J302 Other seasonal allergic rhinitis: Secondary | ICD-10-CM

## 2015-11-21 DIAGNOSIS — R05 Cough: Secondary | ICD-10-CM

## 2015-11-21 DIAGNOSIS — R059 Cough, unspecified: Secondary | ICD-10-CM

## 2015-11-21 DIAGNOSIS — R7303 Prediabetes: Secondary | ICD-10-CM

## 2015-11-21 DIAGNOSIS — I10 Essential (primary) hypertension: Secondary | ICD-10-CM

## 2015-11-21 DIAGNOSIS — Z1211 Encounter for screening for malignant neoplasm of colon: Secondary | ICD-10-CM

## 2015-11-21 DIAGNOSIS — R4689 Other symptoms and signs involving appearance and behavior: Secondary | ICD-10-CM

## 2015-11-21 DIAGNOSIS — E785 Hyperlipidemia, unspecified: Secondary | ICD-10-CM

## 2015-11-21 MED ORDER — TRIAMTERENE-HCTZ 37.5-25 MG PO TABS: 1.5000 | ORAL_TABLET | Freq: Every day | ORAL | Status: DC

## 2015-11-21 MED ORDER — AMLODIPINE BESYLATE 10 MG PO TABS: 10.0000 mg | ORAL_TABLET | Freq: Every day | ORAL | Status: DC

## 2015-11-21 MED ORDER — CLONIDINE HCL 0.1 MG PO TABS: ORAL_TABLET | ORAL | Status: DC

## 2015-11-21 NOTE — Patient Instructions (Addendum)
Nurse BP check in 4 weeks  MD follow up in 4 months  TAKE your THREE blood pressure medications EVERY DAY as you NEED TO  STOP SoDAS, and CUT BACK on rice and bREAD  Use zyrtec for allergies, and get CXR due to chronic cough  Labs for next visit are non fasting in 4 month   You will  be referred for colonoscopy, you need this

## 2015-11-21 NOTE — Progress Notes (Signed)
Subjective:    Patient ID: Michelle David, female    DOB: Nov 28, 1960, 55 y.o.   MRN: [id number]  HPI   ERICK OCHELTREE     MRN: [id number]      DOB: 05-16-1961   HPI [name] is here for follow up and re-evaluation of chronic medical conditions, medication management and review of any available recent lab and radiology data.  Preventive health is updated, specifically  Cancer screening and Immunization.   Questions or concerns regarding consultations or procedures which the PT has had in the interim are  addressed. The PT denies any adverse reactions to current medications since the last visit. Still not taking her medication as prescribed, hence blood pressure remains elevated Coughing x 32 months, most times no sputum, when she has it this is clear, no fever or chills    ROS Denies recent fever or chills. Denies sinus pressure, , ear pain or sore throat. Denies chest pains, palpitations and leg swelling Denies abdominal pain, nausea, vomiting,diarrhea or constipation.   Denies dysuria, frequency, hesitancy or incontinence. Denies joint pain, swelling and limitation in mobility. Denies headaches, seizures, numbness, or tingling. Denies depression, anxiety or insomnia. Denies skin break down or rash.   PE  BP 150/96 mmHg  Pulse 96  Resp 16  Ht 5\' 2"  (1.575 m)  Wt 227 lb (102.967 kg)  BMI 41.51 kg/m2  SpO2 96%  Patient alert and oriented and in no cardiopulmonary distress.  HEENT: No facial asymmetry, EOMI,   oropharynx pink and moist.  Neck supple no JVD, no mass. Erythema and edema of nasal mucosa with excessive clear nasal drainage Chest: Clear to auscultation bilaterally.  CVS: S1, S2 no murmurs, no S3.Regular rate.  ABD: Soft non tender.   Ext: No edema  MS: Adequate ROM spine, shoulders, hips and knees.  Skin: Intact, no ulcerations or rash noted.  Psych: Good eye contact, normal affect. Memory intact not anxious or depressed appearing.  CNS:  CN 2-12 intact, power,  normal throughout.no focal deficits noted.   Assessment & Plan  HTN (hypertension), malignant Uncontrolled due to medication non compliance importance of sameis stressed DASH diet and commitment to daily physical activity for a minimum of 30 minutes discussed and encouraged, as a part of hypertension management. The importance of attaining a healthy weight is also discussed.  BP/Weight 11/21/2015 08/30/2015 08/06/2015 07/04/2015 03/07/2015 03/01/2015 Q000111Q  Systolic BP Q000111Q 123456 Q000111Q 0000000 [id number] Q000111Q  Diastolic BP 96 81 [id number] 123456 88 100 93  Wt. (Lbs) 227 - - 224 224.4 224.08 228  BMI 41.51 - - 40.96 41.03 40.97 41.69        Seasonal allergies Uncontrolled, causing excess cough, no fever or chills, needs to commit to daily allergy med CXR today  Morbid obesity Deteriorated. Patient re-educated about  the importance of commitment to a  minimum of 150 minutes of exercise per week.  The importance of healthy food choices with portion control discussed. Encouraged to start a food diary, count calories and to consider  joining a support group. Sample diet sheets offered. Goals set by the patient for the next several months.   Weight /BMI 11/21/2015 07/04/2015 03/07/2015  WEIGHT 227 lb 224 lb 224 lb 6.4 oz  HEIGHT 5\' 2"  5\' 2"  -  BMI 41.51 kg/m2 40.96 kg/m2 41.03 kg/m2    Current exercise per week 60 minutes.   Prediabetes Patient educated about the importance of limiting  Carbohydrate intake , the need to commit to  daily physical activity for a minimum of 30 minutes , and to commit weight loss. The fact that changes in all these areas will reduce or eliminate all together the development of diabetes is stressed.  Updated lab needed at/ before next visit.   Diabetic Labs Latest Ref Rng 03/01/2015 11/13/2014 03/07/2014 01/26/2012 06/02/2011  HbA1c <5.7 % 5.4 - 5.3 5.7(H) 5.6  Chol 0 - 200 mg/dL 182 - 177 176 198  HDL >=46 mg/dL 49 - 50 54 49  Calc LDL 0 - 99 mg/dL  118(H) - 112(H) 110(H) 135(H)  Triglycerides <150 mg/dL 75 - 73 58 68  Creatinine 0.50 - 1.10 mg/dL 0.67 0.70 0.91 0.66 0.87   BP/Weight 11/21/2015 08/30/2015 08/06/2015 07/04/2015 03/07/2015 03/01/2015 Q000111Q  Systolic BP Q000111Q 123456 Q000111Q 0000000 [id number] Q000111Q  Diastolic BP 96 81 [id number] 123456 88 100 93  Wt. (Lbs) 227 - - 224 224.4 224.08 228  BMI 41.51 - - 40.96 41.03 40.97 41.69   No flowsheet data found.     Hyperlipidemia LDL goal <100 Hyperlipidemia:Low fat diet discussed and encouraged.   Lipid Panel  Lab Results  Component Value Date   CHOL 182 03/01/2015   HDL 49 03/01/2015   LDLCALC 118* 03/01/2015   TRIG 75 03/01/2015   CHOLHDL 3.7 03/01/2015   Updated lab needed at/ before next visit.     Non-compliant behavior Ongoing problem , again reminded of the complications of untreated hypertension and her ned to have a normal blood pressure by medication adherence and lifestyle change       Review of Systems     Objective:   Physical Exam        Assessment & Plan:

## 2015-12-02 NOTE — Assessment & Plan Note (Signed)
Ongoing problem , again reminded of the complications of untreated hypertension and her ned to have a normal blood pressure by medication adherence and lifestyle change

## 2015-12-02 NOTE — Assessment & Plan Note (Signed)
Patient educated about the importance of limiting  Carbohydrate intake , the need to commit to daily physical activity for a minimum of 30 minutes , and to commit weight loss. The fact that changes in all these areas will reduce or eliminate all together the development of diabetes is stressed.  Updated lab needed at/ before next visit.   Diabetic Labs Latest Ref Rng 03/01/2015 11/13/2014 03/07/2014 01/26/2012 06/02/2011  HbA1c <5.7 % 5.4 - 5.3 5.7(H) 5.6  Chol 0 - 200 mg/dL 182 - 177 176 198  HDL >=46 mg/dL 49 - 50 54 49  Calc LDL 0 - 99 mg/dL 118(H) - 112(H) 110(H) 135(H)  Triglycerides <150 mg/dL 75 - 73 58 68  Creatinine 0.50 - 1.10 mg/dL 0.67 0.70 0.91 0.66 0.87   BP/Weight 11/21/2015 08/30/2015 08/06/2015 07/04/2015 03/07/2015 03/01/2015 Q000111Q  Systolic BP Q000111Q 123456 Q000111Q 0000000 [id number] 123XX123 Q000111Q  Diastolic BP 96 81 123XX123 123456 88 100 93  Wt. (Lbs) 227 - - 224 224.4 224.08 228  BMI 41.51 - - 40.96 41.03 40.97 41.69   No flowsheet data found.

## 2015-12-02 NOTE — Assessment & Plan Note (Signed)
Deteriorated. Patient re-educated about  the importance of commitment to a  minimum of 150 minutes of exercise per week.  The importance of healthy food choices with portion control discussed. Encouraged to start a food diary, count calories and to consider  joining a support group. Sample diet sheets offered. Goals set by the patient for the next several months.   Weight /BMI 11/21/2015 07/04/2015 03/07/2015  WEIGHT 227 lb 224 lb 224 lb 6.4 oz  HEIGHT 5\' 2"  5\' 2"  -  BMI 41.51 kg/m2 40.96 kg/m2 41.03 kg/m2    Current exercise per week 60 minutes.

## 2015-12-02 NOTE — Assessment & Plan Note (Signed)
Hyperlipidemia:Low fat diet discussed and encouraged.   Lipid Panel  Lab Results  Component Value Date   CHOL 182 03/01/2015   HDL 49 03/01/2015   LDLCALC 118* 03/01/2015   TRIG 75 03/01/2015   CHOLHDL 3.7 03/01/2015  Updated lab needed at/ before next visit.     

## 2015-12-02 NOTE — Assessment & Plan Note (Signed)
Uncontrolled, causing excess cough, no fever or chills, needs to commit to daily allergy med CXR today

## 2015-12-02 NOTE — Assessment & Plan Note (Signed)
Uncontrolled due to medication non compliance importance of sameis stressed DASH diet and commitment to daily physical activity for a minimum of 30 minutes discussed and encouraged, as a part of hypertension management. The importance of attaining a healthy weight is also discussed.  BP/Weight 11/21/2015 08/30/2015 08/06/2015 07/04/2015 03/07/2015 03/01/2015 Q000111Q  Systolic BP Q000111Q 123456 Q000111Q 0000000 [id number] 123XX123 Q000111Q  Diastolic BP 96 81 123XX123 123456 88 100 93  Wt. (Lbs) 227 - - 224 224.4 224.08 228  BMI 41.51 - - 40.96 41.03 40.97 41.69

## 2016-01-23 IMAGING — MG MM DIGITAL SCREENING BILAT
4 series · 4 of 4 positions shown · non-contrast
Comparison: Previous exam(s)

ACR Breast Density Category a: The breast tissue is almost entirely
fatty.

CLINICAL DATA: Screening.

EXAM:
DIGITAL SCREENING BILATERAL MAMMOGRAM WITH CAD

[L CC]
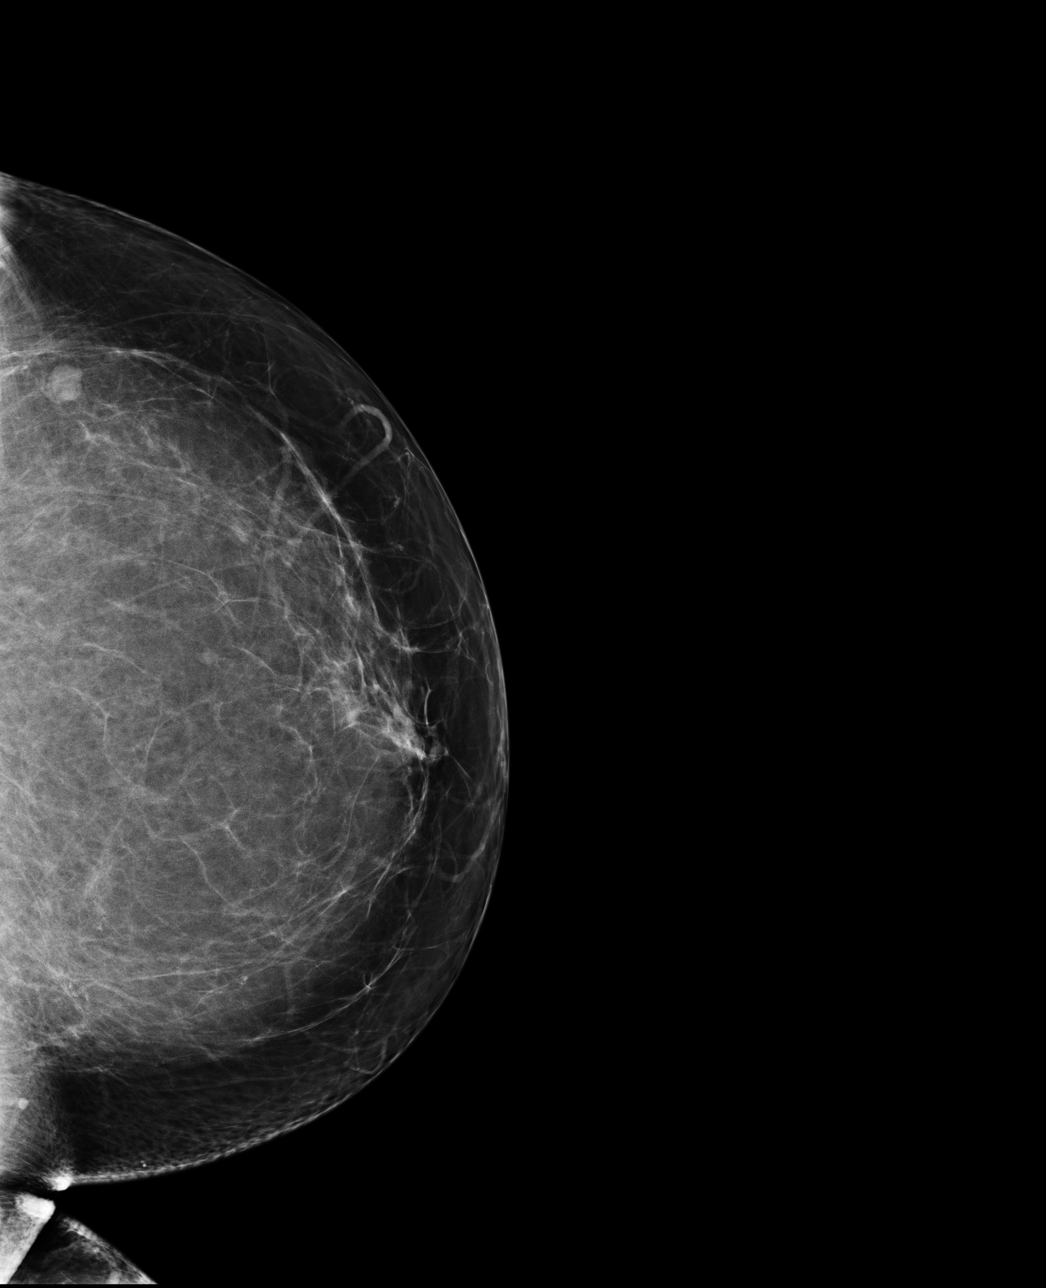

[L MLO]
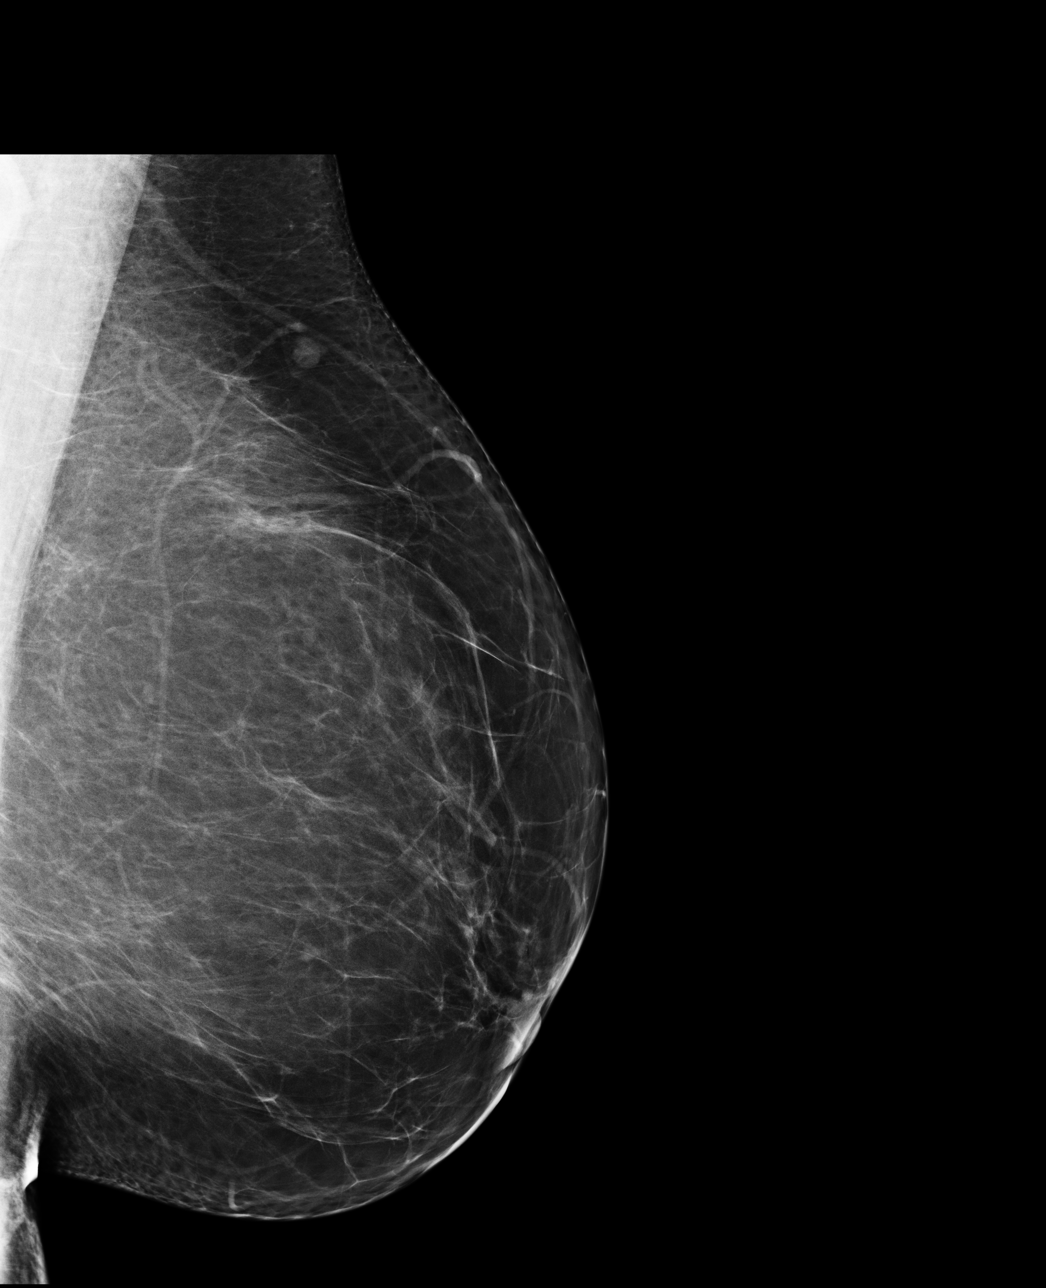

[R CC]
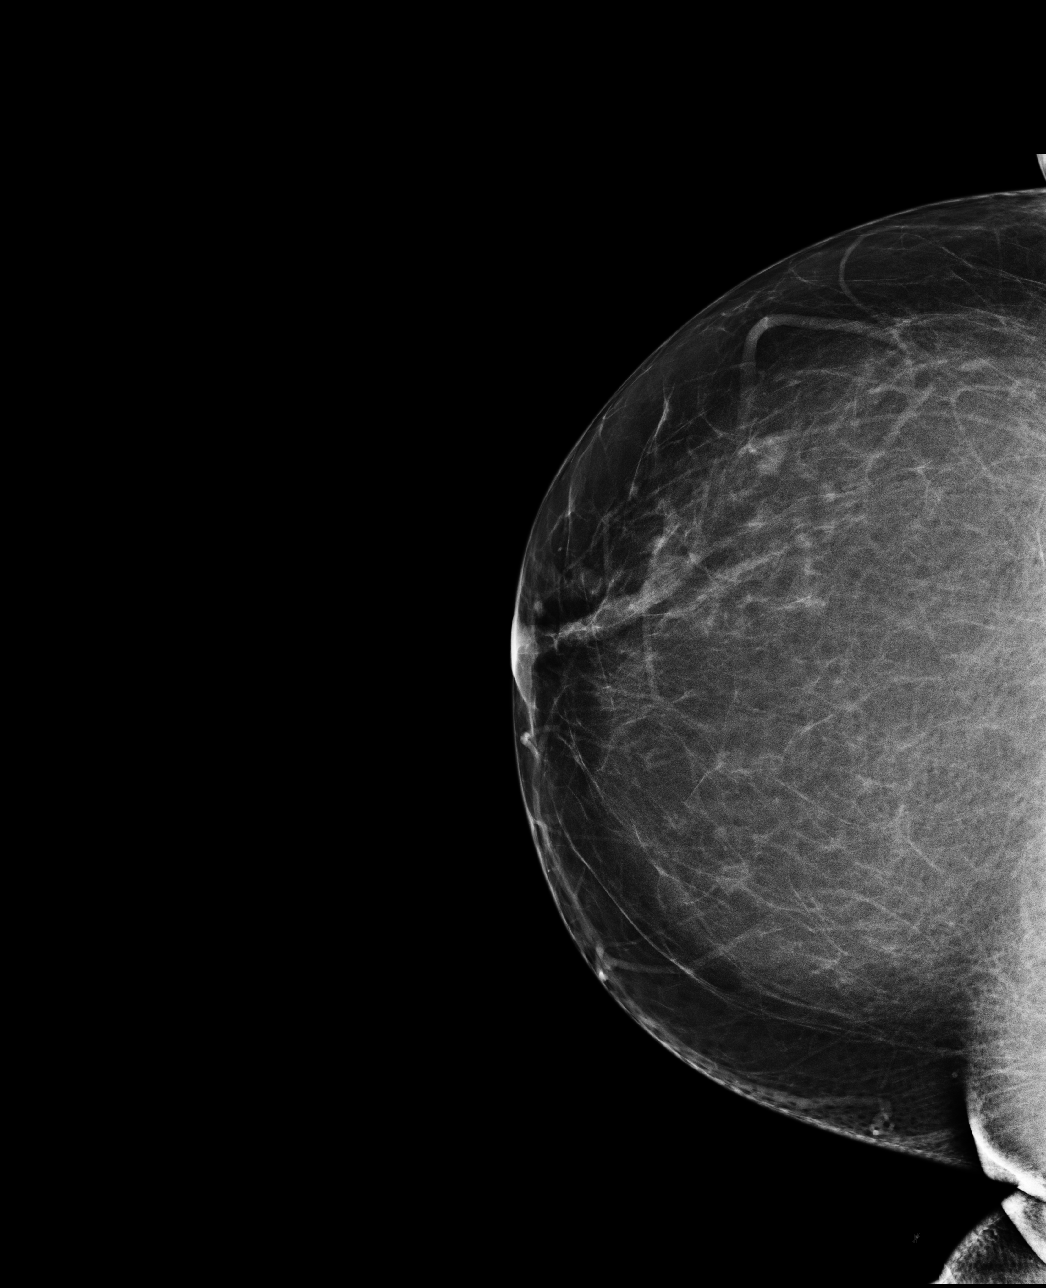

[R MLO]
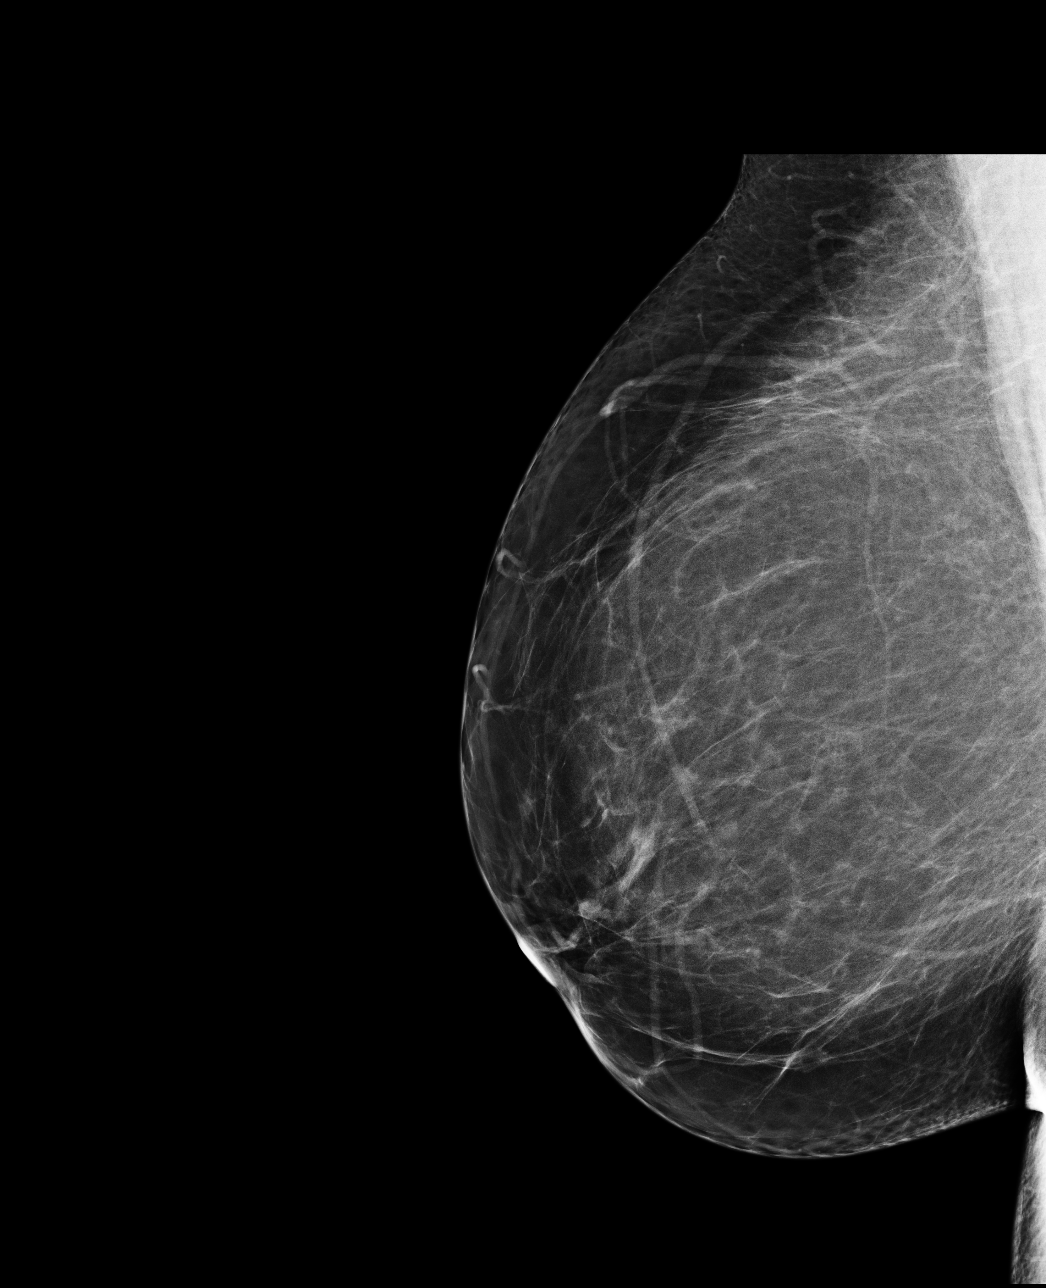

[4 of 4 positions shown; findings below may reference images not displayed]

FINDINGS: There are no findings suspicious for malignancy. Images were
processed with CAD.
IMPRESSION: No mammographic evidence of malignancy. A result letter of this
screening mammogram will be mailed directly to the patient.

RECOMMENDATION:
Screening mammogram in one year. (Code:[id number])

BI-RADS CATEGORY  1: Negative.

## 2016-03-20 ENCOUNTER — Ambulatory Visit (INDEPENDENT_AMBULATORY_CARE_PROVIDER_SITE_OTHER): Payer: BLUE CROSS/BLUE SHIELD | Admitting: Family Medicine

## 2016-03-20 ENCOUNTER — Telehealth: Payer: Self-pay | Admitting: Family Medicine

## 2016-03-20 ENCOUNTER — Encounter: Payer: Self-pay | Admitting: Family Medicine

## 2016-03-20 VITALS — BP 160/100 | HR 91 | Resp 16 | Ht 62.0 in | Wt 225.0 lb

## 2016-03-20 DIAGNOSIS — N3091 Cystitis, unspecified with hematuria: Secondary | ICD-10-CM | POA: Insufficient documentation

## 2016-03-20 DIAGNOSIS — I10 Essential (primary) hypertension: Secondary | ICD-10-CM

## 2016-03-20 DIAGNOSIS — R7303 Prediabetes: Secondary | ICD-10-CM | POA: Diagnosis not present

## 2016-03-20 DIAGNOSIS — Z1159 Encounter for screening for other viral diseases: Secondary | ICD-10-CM

## 2016-03-20 DIAGNOSIS — E785 Hyperlipidemia, unspecified: Secondary | ICD-10-CM | POA: Diagnosis not present

## 2016-03-20 DIAGNOSIS — N3001 Acute cystitis with hematuria: Secondary | ICD-10-CM

## 2016-03-20 DIAGNOSIS — E8881 Metabolic syndrome: Secondary | ICD-10-CM

## 2016-03-20 DIAGNOSIS — R4689 Other symptoms and signs involving appearance and behavior: Secondary | ICD-10-CM

## 2016-03-20 DIAGNOSIS — E041 Nontoxic single thyroid nodule: Secondary | ICD-10-CM

## 2016-03-20 LAB — CBC
HCT: 38.1 % (ref 35.0–45.0)
Hemoglobin: 12.7 g/dL (ref 11.7–15.5)
MCH: 29.3 pg (ref 27.0–33.0)
MCHC: 33.3 g/dL (ref 32.0–36.0)
MCV: 87.8 fL (ref 80.0–100.0)
MPV: 8.9 fL (ref 7.5–12.5)
PLATELETS: 362 10*3/uL (ref 140–400)
RBC: 4.34 MIL/uL (ref 3.80–5.10)
RDW: 14.9 % (ref 11.0–15.0)
WBC: 5 10*3/uL (ref 3.8–10.8)

## 2016-03-20 LAB — BASIC METABOLIC PANEL
BUN: 13 mg/dL (ref 7–25)
CHLORIDE: 103 mmol/L (ref 98–110)
CO2: 28 mmol/L (ref 20–31)
Calcium: 8.7 mg/dL (ref 8.6–10.4)
Creat: 0.75 mg/dL (ref 0.50–1.05)
Glucose, Bld: 67 mg/dL (ref 65–99)
POTASSIUM: 4 mmol/L (ref 3.5–5.3)
SODIUM: 139 mmol/L (ref 135–146)

## 2016-03-20 LAB — POCT URINALYSIS DIPSTICK
Bilirubin, UA: NEGATIVE
Glucose, UA: NEGATIVE
Ketones, UA: NEGATIVE
NITRITE UA: NEGATIVE
PH UA: 6.5
PROTEIN UA: NEGATIVE
Spec Grav, UA: 1.02
Urobilinogen, UA: 1

## 2016-03-20 LAB — TSH: TSH: 1.37 mIU/L

## 2016-03-20 MED ORDER — TRIAMTERENE-HCTZ 75-50 MG PO TABS: 1.0000 | ORAL_TABLET | Freq: Every day | ORAL | Status: DC

## 2016-03-20 MED ORDER — CIPROFLOXACIN HCL 500 MG PO TABS: 500.0000 mg | ORAL_TABLET | Freq: Two times a day (BID) | ORAL | Status: DC

## 2016-03-20 NOTE — Progress Notes (Signed)
Subjective:    Patient ID: Michelle David, female    DOB: 02-18-1961, 55 y.o.   MRN: [id number]  HPI   Michelle David     MRN: [id number]      DOB: 03/25/1961   HPI [name] is here for follow up and re-evaluation of chronic medical conditions, medication management and review of any available recent lab and radiology data.  Preventive health is updated, specifically  Cancer screening and Immunization.    The PT denies any adverse reactions to current medications since the last visit. Unfortunately NOT taking her medications There are no specific complaints  \reports no change in diet but has lost 2 pounds  ROS Denies recent fever or chills. Denies sinus pressure, nasal congestion, ear pain or sore throat. Denies chest congestion, productive cough or wheezing. Denies chest pains, palpitations and leg swelling Denies abdominal pain, nausea, vomiting,diarrhea or constipation.   C/o mild  Dysuria and  Frequency for 3 week, denies fever, chills or flank pain Denies joint pain, swelling and limitation in mobility. Denies headaches, seizures, numbness, or tingling. Denies depression,  C/o increased stress and anxiety always worrying about her adult children, and not caring herself. Denies skin break down or rash.   PE  BP 160/100 mmHg  Pulse 91  Resp 16  Ht 5\' 2"  (1.575 m)  Wt 225 lb (102.059 kg)  BMI 41.14 kg/m2  SpO2 99%  Patient alert and oriented and in no cardiopulmonary distress.  HEENT: No facial asymmetry, EOMI,   oropharynx pink and moist.  Neck supple no JVD, no mass.  Chest: Clear to auscultation bilaterally.  CVS: S1, S2 no murmurs, no S3.Regular rate.  ABD: Soft non tender.   Ext: No edema  MS: Adequate ROM spine, shoulders, hips and knees.  Skin: Intact, no ulcerations or rash noted.  Psych: Good eye contact, normal affect. Memory intact not anxious or depressed appearing.  CNS: CN 2-12 intact, power,  normal throughout.no focal deficits  noted.   Assessment & Plan  HTN (hypertension), malignant Uncontrolled, not taking any medication, re educated DASH diet and commitment to daily physical activity for a minimum of 30 minutes discussed and encouraged, as a part of hypertension management. The importance of attaining a healthy weight is also discussed.  BP/Weight 03/20/2016 11/21/2015 08/30/2015 08/06/2015 07/04/2015 03/07/2015 Q000111Q  Systolic BP 0000000 Q000111Q 123456 Q000111Q 0000000 [id number] 123XX123  Diastolic BP 123XX123 96 81 123XX123 104 88 100  Wt. (Lbs) 225 227 - - 224 224.4 224.08  BMI 41.14 41.51 - - 40.96 41.03 40.97        Cystitis with hematuria 3 wek h/o frequency, denies fever, chills opr flank pain, abnormal ua, cipro x 3 days prescribed, specimen not sent for c/s , will rept UA to evaluate hematuria   Solitary thyroid nodule Followed by eNT, rept TSH needed  Non-compliant behavior Ongoing challenge, pt repeatedly sites "stress" worrying over her children, comes in for follow up of blood pressure not taking medication, she is now limited to one medication on a $4 per month list  Prediabetes Patient educated about the importance of limiting  Carbohydrate intake , the need to commit to daily physical activity for a minimum of 30 minutes , and to commit weight loss. The fact that changes in all these areas will reduce or eliminate all together the development of diabetes is stressed.   Diabetic Labs Latest Ref Rng 03/01/2015 11/13/2014 03/07/2014 01/26/2012 06/02/2011  HbA1c <5.7 % 5.4 - 5.3 5.7(H) 5.6  Chol 0 - 200 mg/dL 182 - 177 176 198  HDL >=46 mg/dL 49 - 50 54 49  Calc LDL 0 - 99 mg/dL 118(H) - 112(H) 110(H) 135(H)  Triglycerides <150 mg/dL 75 - 73 58 68  Creatinine 0.50 - 1.10 mg/dL 0.67 0.70 0.91 0.66 0.87   BP/Weight 03/20/2016 11/21/2015 08/30/2015 08/06/2015 07/04/2015 03/07/2015 Q000111Q  Systolic BP 0000000 Q000111Q 123456 Q000111Q 0000000 [id number] 123XX123  Diastolic BP 123XX123 96 81 123XX123 104 88 100  Wt. (Lbs) 225 227 - - 224 224.4 224.08  BMI 41.14 41.51 - - 40.96  41.03 40.97   No flowsheet data found.   Updated lab needed at/ before next visit.   Hyperlipidemia LDL goal <100 Hyperlipidemia:Low fat diet discussed and encouraged.   Lipid Panel  Lab Results  Component Value Date   CHOL 182 03/01/2015   HDL 49 03/01/2015   LDLCALC 118* 03/01/2015   TRIG 75 03/01/2015   CHOLHDL 3.7 03/01/2015   Updated lab needed at/ before next visit.          Review of Systems     Objective:   Physical Exam        Assessment & Plan:

## 2016-03-20 NOTE — Assessment & Plan Note (Signed)
Patient educated about the importance of limiting  Carbohydrate intake , the need to commit to daily physical activity for a minimum of 30 minutes , and to commit weight loss. The fact that changes in all these areas will reduce or eliminate all together the development of diabetes is stressed.   Diabetic Labs Latest Ref Rng 03/01/2015 11/13/2014 03/07/2014 01/26/2012 06/02/2011  HbA1c <5.7 % 5.4 - 5.3 5.7(H) 5.6  Chol 0 - 200 mg/dL 182 - 177 176 198  HDL >=46 mg/dL 49 - 50 54 49  Calc LDL 0 - 99 mg/dL 118(H) - 112(H) 110(H) 135(H)  Triglycerides <150 mg/dL 75 - 73 58 68  Creatinine 0.50 - 1.10 mg/dL 0.67 0.70 0.91 0.66 0.87   BP/Weight 03/20/2016 11/21/2015 08/30/2015 08/06/2015 07/04/2015 03/07/2015 Q000111Q  Systolic BP 0000000 Q000111Q 123456 Q000111Q 0000000 [id number] 123XX123  Diastolic BP 123XX123 96 81 123XX123 104 88 100  Wt. (Lbs) 225 227 - - 224 224.4 224.08  BMI 41.14 41.51 - - 40.96 41.03 40.97   No flowsheet data found.   Updated lab needed at/ before next visit.

## 2016-03-20 NOTE — Assessment & Plan Note (Signed)
Followed by eNT, rept TSH needed

## 2016-03-20 NOTE — Telephone Encounter (Signed)
Pls let pt know she needs rept CCUA in office in 3 weeks, since there was blood in urine(nurse visit) if still abnormal I  will follow up further,pls let me know when she gets this done, thanks

## 2016-03-20 NOTE — Assessment & Plan Note (Signed)
Ongoing challenge, pt repeatedly sites "stress" worrying over her children, comes in for follow up of blood pressure not taking medication, she is now limited to one medication on a $4 per month list

## 2016-03-20 NOTE — Assessment & Plan Note (Signed)
Uncontrolled, not taking any medication, re educated DASH diet and commitment to daily physical activity for a minimum of 30 minutes discussed and encouraged, as a part of hypertension management. The importance of attaining a healthy weight is also discussed.  BP/Weight 03/20/2016 11/21/2015 08/30/2015 08/06/2015 07/04/2015 03/07/2015 Q000111Q  Systolic BP 0000000 Q000111Q 123456 Q000111Q 0000000 [id number] [id number]  Diastolic BP 123XX123 96 81 123XX123 104 88 100  Wt. (Lbs) 225 227 - - 224 224.4 224.08  BMI 41.14 41.51 - - 40.96 41.03 40.97

## 2016-03-20 NOTE — Assessment & Plan Note (Signed)
3 wek h/o frequency, denies fever, chills opr flank pain, abnormal ua, cipro x 3 days prescribed, specimen not sent for c/s , will rept UA to evaluate hematuria

## 2016-03-20 NOTE — Assessment & Plan Note (Signed)
Hyperlipidemia:Low fat diet discussed and encouraged.   Lipid Panel  Lab Results  Component Value Date   CHOL 182 03/01/2015   HDL 49 03/01/2015   LDLCALC 118* 03/01/2015   TRIG 75 03/01/2015   CHOLHDL 3.7 03/01/2015   Updated lab needed at/ before next visit.

## 2016-03-20 NOTE — Patient Instructions (Addendum)
CPE in Sept 16 or after, call if you need me sooner  Labs today  ONE medication , maxzide for blood pressure, NEED TO TAKE THIS SAME TIME EVERY DAY  You appear to have a urinary tract infection, 3 days of an antibiotic is prescribed

## 2016-03-21 LAB — HIV ANTIBODY (ROUTINE TESTING W REFLEX): HIV: NONREACTIVE

## 2016-03-21 LAB — HEPATITIS C ANTIBODY: HCV AB: NEGATIVE

## 2016-03-27 NOTE — Telephone Encounter (Signed)
Called and left message for patient to return call.  

## 2016-04-04 NOTE — Telephone Encounter (Signed)
Called patient and left message for them to return call at the office   

## 2016-04-07 NOTE — Telephone Encounter (Signed)
Coming for nurse visit on Thursday

## 2016-04-10 ENCOUNTER — Encounter (INDEPENDENT_AMBULATORY_CARE_PROVIDER_SITE_OTHER): Payer: Self-pay

## 2016-04-10 ENCOUNTER — Ambulatory Visit (INDEPENDENT_AMBULATORY_CARE_PROVIDER_SITE_OTHER): Payer: BLUE CROSS/BLUE SHIELD

## 2016-04-10 DIAGNOSIS — N3001 Acute cystitis with hematuria: Secondary | ICD-10-CM

## 2016-04-10 DIAGNOSIS — R319 Hematuria, unspecified: Secondary | ICD-10-CM

## 2016-04-10 LAB — POCT URINALYSIS DIPSTICK
Bilirubin, UA: NEGATIVE
GLUCOSE UA: NEGATIVE
KETONES UA: NEGATIVE
Nitrite, UA: NEGATIVE
PROTEIN UA: NEGATIVE
SPEC GRAV UA: 1.02
Urobilinogen, UA: 1
pH, UA: 7

## 2016-04-10 NOTE — Progress Notes (Signed)
CCUA done in office to recheck for hematuria. Trace lysed blood still in urine with small leukocytes. Sent for urinalysis and culture

## 2016-04-11 ENCOUNTER — Other Ambulatory Visit (HOSPITAL_COMMUNITY)
Admission: RE | Admit: 2016-04-11 | Discharge: 2016-04-11 | Disposition: A | Discharge status: Home / Self Care | Payer: BLUE CROSS/BLUE SHIELD | Source: Ambulatory Visit | Attending: Family Medicine | Admitting: Family Medicine

## 2016-04-11 DIAGNOSIS — N3001 Acute cystitis with hematuria: Secondary | ICD-10-CM | POA: Insufficient documentation

## 2016-04-11 LAB — URINALYSIS, ROUTINE W REFLEX MICROSCOPIC
BILIRUBIN URINE: NEGATIVE
Glucose, UA: NEGATIVE mg/dL
KETONES UR: NEGATIVE mg/dL
NITRITE: NEGATIVE
Protein, ur: NEGATIVE mg/dL
SPECIFIC GRAVITY, URINE: 1.01 (ref 1.005–1.030)
pH: 6.5 (ref 5.0–8.0)

## 2016-04-11 LAB — URINE MICROSCOPIC-ADD ON

## 2016-04-13 LAB — URINE CULTURE: Culture: 9000 — AB

## 2016-04-16 ENCOUNTER — Other Ambulatory Visit: Payer: Self-pay

## 2016-04-16 DIAGNOSIS — R319 Hematuria, unspecified: Secondary | ICD-10-CM

## 2016-05-21 ENCOUNTER — Ambulatory Visit: Payer: BLUE CROSS/BLUE SHIELD | Admitting: Urology

## 2016-06-25 ENCOUNTER — Ambulatory Visit: Payer: BLUE CROSS/BLUE SHIELD | Admitting: Urology

## 2016-07-03 ENCOUNTER — Encounter: Payer: Self-pay | Admitting: Family Medicine

## 2016-07-03 ENCOUNTER — Ambulatory Visit (INDEPENDENT_AMBULATORY_CARE_PROVIDER_SITE_OTHER): Payer: BLUE CROSS/BLUE SHIELD | Admitting: Family Medicine

## 2016-07-03 VITALS — BP 128/84 | HR 84 | Temp 98.4°F | Resp 16 | Ht 62.0 in | Wt 220.0 lb

## 2016-07-03 DIAGNOSIS — J209 Acute bronchitis, unspecified: Secondary | ICD-10-CM | POA: Diagnosis not present

## 2016-07-03 DIAGNOSIS — I10 Essential (primary) hypertension: Secondary | ICD-10-CM

## 2016-07-03 DIAGNOSIS — H6692 Otitis media, unspecified, left ear: Secondary | ICD-10-CM | POA: Insufficient documentation

## 2016-07-03 DIAGNOSIS — H6502 Acute serous otitis media, left ear: Secondary | ICD-10-CM

## 2016-07-03 DIAGNOSIS — K529 Noninfective gastroenteritis and colitis, unspecified: Secondary | ICD-10-CM

## 2016-07-03 MED ORDER — SULFAMETHOXAZOLE-TRIMETHOPRIM 800-160 MG PO TABS: 1.0000 | ORAL_TABLET | Freq: Two times a day (BID) | ORAL | 0 refills | Status: DC

## 2016-07-03 MED ORDER — BENZONATATE 100 MG PO CAPS: 100.0000 mg | ORAL_CAPSULE | Freq: Two times a day (BID) | ORAL | 0 refills | Status: DC | PRN

## 2016-07-03 MED ORDER — RANITIDINE HCL 300 MG PO TABS: 300.0000 mg | ORAL_TABLET | Freq: Every day | ORAL | 0 refills | Status: DC

## 2016-07-03 MED ORDER — ONDANSETRON 4 MG PO TBDP: 4.0000 mg | ORAL_TABLET | Freq: Three times a day (TID) | ORAL | 0 refills | Status: DC | PRN

## 2016-07-03 NOTE — Progress Notes (Signed)
   Michelle David     MRN: [id number]      DOB: [birth year]   HPI Michelle David is here with a 3 day h/o vomiting and loose stool. The diarrhea was at most 2 per day, lasting one day only Vomit x 4 on first day, also yesterday, none today  Left ear pain x 3 days, no fever has had  Chills, yellow nasal drainage and cough productive of yellow sputum  ROS . Denies chest congestion, productive cough or wheezing. Denies chest pains, palpitations and leg swelling .   Denies dysuria, frequency, hesitancy or incontinence. Denies joint pain, swelling and limitation in mobility. Denies headaches, seizures, numbness, or tingling. Denies depression, anxiety or insomnia. Denies skin break down or rash.   PE  BP 128/84   Pulse 84   Temp 98.4 F (36.9 C) (Oral)   Resp 16   Ht 5\' 2"  (1.575 m)   Wt 220 lb (99.8 kg)   SpO2 98%   BMI 40.24 kg/m   Patient alert and oriented and in no cardiopulmonary distress.  HEENT: No facial asymmetry, EOMI,   oropharynx pink and moist.  Neck supple no JVD,right anterior cervical adenitis, right maxillary sinus tenderness, left tM erythematous and edematous  Chest: decreased air entry, bilateral crackles and wheezes  CVS: S1, S2 no murmurs, no S3.Regular rate.  ABD: Soft mild epigastric tenderness, hyperactive BS  Ext: No edema  MS: Adequate ROM spine, shoulders, hips and knees.  Skin: Intact, no ulcerations or rash noted.  Psych: Good eye contact, normal affect. Memory intact not anxious or depressed appearing.  CNS: CN 2-12 intact, power,  normal throughout.no focal deficits noted.   Assessment & Plan  Left otitis media 4 day h/o pain , TM erythematous and dull, septra x 10 days  Acute bronchitis Antibiotic, septra,  And decongestant,tessalon perles prescribed  Acute gastroenteritis BRAT diet, zofran for as needed use, but symptoms are resolving  Morbid obesity Improved Patient re-educated about  the importance of commitment to a   minimum of 150 minutes of exercise per week.  The importance of healthy food choices with portion control discussed. Encouraged to start a food diary, count calories and to consider  joining a support group. Sample diet sheets offered. Goals set by the patient for the next several months.   Weight /BMI 07/03/2016 03/20/2016 11/21/2015  WEIGHT 220 lb 225 lb 227 lb  HEIGHT 5\' 2"  5\' 2"  5\' 2"   BMI 40.24 kg/m2 41.14 kg/m2 41.51 kg/m2      HTN (hypertension), malignant Controlled, no change in medication DASH diet and commitment to daily physical activity for a minimum of 30 minutes discussed and encouraged, as a part of hypertension management. The importance of attaining a healthy weight is also discussed.  BP/Weight 07/03/2016 03/20/2016 11/21/2015 08/30/2015 08/06/2015 07/04/2015 123XX123  Systolic BP 0000000 0000000 Q000111Q 123456 Q000111Q 0000000 [id number]  Diastolic BP 84 123XX123 96 81 123XX123 104 88  Wt. (Lbs) 220 225 227 - - 224 224.4  BMI 40.24 41.14 41.51 - - 40.96 41.03

## 2016-07-03 NOTE — Assessment & Plan Note (Addendum)
Improved Patient re-educated about  the importance of commitment to a  minimum of 150 minutes of exercise per week.  The importance of healthy food choices with portion control discussed. Encouraged to start a food diary, count calories and to consider  joining a support group. Sample diet sheets offered. Goals set by the patient for the next several months.   Weight /BMI 07/03/2016 03/20/2016 11/21/2015  WEIGHT 220 lb 225 lb 227 lb  HEIGHT 5\' 2"  5\' 2"  5\' 2"   BMI 40.24 kg/m2 41.14 kg/m2 41.51 kg/m2

## 2016-07-03 NOTE — Assessment & Plan Note (Addendum)
4 day h/o pain , TM erythematous and dull, septra x 10 days

## 2016-07-03 NOTE — Patient Instructions (Addendum)
F/U in October as before , call if you need me sooner  You have left ear infection, acute sinusitis and bronchitis  You had a styomach virus which is improving  Work excuse from 9/12 to return 07/07/2016  Antibiotic, decongestant and medication for nausea and heartburn are prescribed    Food Choices to Help Relieve Diarrhea, Adult When you have diarrhea, the foods you eat and your eating habits are very important. Choosing the right foods and drinks can help relieve diarrhea. Also, because diarrhea can last up to 7 days, you need to replace lost fluids and electrolytes (such as sodium, potassium, and chloride) in order to help prevent dehydration.  WHAT GENERAL GUIDELINES DO I NEED TO FOLLOW?  Slowly drink 1 cup (8 oz) of fluid for each episode of diarrhea. If you are getting enough fluid, your urine will be clear or pale yellow.  Eat starchy foods. Some good choices include white rice, white toast, pasta, low-fiber cereal, baked potatoes (without the skin), saltine crackers, and bagels.  Avoid large servings of any cooked vegetables.  Limit fruit to two servings per day. A serving is  cup or 1 small piece.  Choose foods with less than 2 g of fiber per serving.  Limit fats to less than 8 tsp (38 g) per day.  Avoid fried foods.  Eat foods that have probiotics in them. Probiotics can be found in certain dairy products.  Avoid foods and beverages that may increase the speed at which food moves through the stomach and intestines (gastrointestinal tract). Things to avoid include:  High-fiber foods, such as dried fruit, raw fruits and vegetables, nuts, seeds, and whole grain foods.  Spicy foods and high-fat foods.  Foods and beverages sweetened with high-fructose corn syrup, honey, or sugar alcohols such as xylitol, sorbitol, and mannitol. WHAT FOODS ARE RECOMMENDED? Grains White rice. White, [place], or pita breads (fresh or toasted), including plain rolls, buns, or bagels. White  pasta. Saltine, soda, or graham crackers. Pretzels. Low-fiber cereal. Cooked cereals made with water (such as cornmeal, farina, or cream cereals). Plain muffins. Matzo. Melba toast. Zwieback.  Vegetables Potatoes (without the skin). Strained tomato and vegetable juices. Most well-cooked and canned vegetables without seeds. Tender lettuce. Fruits Cooked or canned applesauce, apricots, cherries, fruit cocktail, grapefruit, peaches, pears, or plums. Fresh bananas, apples without skin, cherries, grapes, cantaloupe, grapefruit, peaches, oranges, or plums.  Meat and Other Protein Products Baked or boiled chicken. Eggs. Tofu. Fish. Seafood. Smooth peanut butter. Ground or well-cooked tender beef, ham, veal, lamb, pork, or poultry.  Dairy Plain yogurt, kefir, and unsweetened liquid yogurt. Lactose-free milk, buttermilk, or soy milk. Plain hard cheese. Beverages Sport drinks. Clear broths. Diluted fruit juices (except prune). Regular, caffeine-free sodas such as ginger ale. Water. Decaffeinated teas. Oral rehydration solutions. Sugar-free beverages not sweetened with sugar alcohols. Other Bouillon, broth, or soups made from recommended foods.  The items listed above may not be a complete list of recommended foods or beverages. Contact your dietitian for more options. WHAT FOODS ARE NOT RECOMMENDED? Grains Whole grain, whole wheat, bran, or rye breads, rolls, pastas, crackers, and cereals. Wild or brown rice. Cereals that contain more than 2 g of fiber per serving. Corn tortillas or taco shells. Cooked or dry oatmeal. Granola. Popcorn. Vegetables Raw vegetables. Cabbage, broccoli, Brussels sprouts, artichokes, baked beans, beet greens, corn, kale, legumes, peas, sweet potatoes, and yams. Potato skins. Cooked spinach and cabbage. Fruits Dried fruit, including raisins and dates. Raw fruits. Stewed or dried prunes. Fresh  apples with skin, apricots, mangoes, pears, raspberries, and strawberries.  Meat and  Other Protein Products Chunky peanut butter. Nuts and seeds. Beans and lentils. Berniece Salines.  Dairy High-fat cheeses. Milk, chocolate milk, and beverages made with milk, such as milk shakes. Cream. Ice cream. Sweets and Desserts Sweet rolls, doughnuts, and sweet breads. Pancakes and waffles. Fats and Oils Butter. Cream sauces. Margarine. Salad oils. Plain salad dressings. Olives. Avocados.  Beverages Caffeinated beverages (such as coffee, tea, soda, or energy drinks). Alcoholic beverages. Fruit juices with pulp. Prune juice. Soft drinks sweetened with high-fructose corn syrup or sugar alcohols. Other Coconut. Hot sauce. Chili powder. Mayonnaise. Gravy. Cream-based or milk-based soups.  The items listed above may not be a complete list of foods and beverages to avoid. Contact your dietitian for more information. WHAT SHOULD I DO IF I BECOME DEHYDRATED? Diarrhea can sometimes lead to dehydration. Signs of dehydration include dark urine and dry mouth and skin. If you think you are dehydrated, you should rehydrate with an oral rehydration solution. These solutions can be purchased at pharmacies, retail stores, or online.  Drink -1 cup (120-240 mL) of oral rehydration solution each time you have an episode of diarrhea. If drinking this amount makes your diarrhea worse, try drinking smaller amounts more often. For example, drink 1-3 tsp (5-15 mL) every 5-10 minutes.  A general rule for staying hydrated is to drink 1-2 L of fluid per day. Talk to your health care provider about the specific amount you should be drinking each day. Drink enough fluids to keep your urine clear or pale yellow.   This information is not intended to replace advice given to you by your health care provider. Make sure you discuss any questions you have with your health care provider.   Document Released: 12/27/2003 Document Revised: 10/27/2014 Document Reviewed: 08/29/2013 Elsevier Interactive Patient Education [employer].

## 2016-07-03 NOTE — Assessment & Plan Note (Signed)
BRAT diet, zofran for as needed use, but symptoms are resolving

## 2016-07-03 NOTE — Assessment & Plan Note (Signed)
Controlled, no change in medication DASH diet and commitment to daily physical activity for a minimum of 30 minutes discussed and encouraged, as a part of hypertension management. The importance of attaining a healthy weight is also discussed.  BP/Weight 07/03/2016 03/20/2016 11/21/2015 08/30/2015 08/06/2015 07/04/2015 123XX123  Systolic BP 0000000 0000000 Q000111Q 123456 Q000111Q 0000000 [id number]  Diastolic BP 84 123XX123 96 81 123XX123 104 88  Wt. (Lbs) 220 225 227 - - 224 224.4  BMI 40.24 41.14 41.51 - - 40.96 41.03

## 2016-07-03 NOTE — Assessment & Plan Note (Signed)
Antibiotic, septra,  And decongestant,tessalon perles prescribed

## 2016-07-24 ENCOUNTER — Encounter: Payer: BLUE CROSS/BLUE SHIELD | Admitting: Family Medicine

## 2016-07-30 ENCOUNTER — Other Ambulatory Visit: Payer: Self-pay | Admitting: Family Medicine

## 2016-08-05 ENCOUNTER — Other Ambulatory Visit (HOSPITAL_COMMUNITY)
Admission: AD | Admit: 2016-08-05 | Discharge: 2016-08-05 | Disposition: A | Discharge status: Home / Self Care | Payer: BLUE CROSS/BLUE SHIELD | Source: Other Acute Inpatient Hospital | Attending: Urology | Admitting: Urology

## 2016-08-05 ENCOUNTER — Ambulatory Visit (INDEPENDENT_AMBULATORY_CARE_PROVIDER_SITE_OTHER): Payer: BLUE CROSS/BLUE SHIELD | Admitting: Urology

## 2016-08-05 DIAGNOSIS — R3915 Urgency of urination: Secondary | ICD-10-CM | POA: Diagnosis not present

## 2016-08-05 DIAGNOSIS — R311 Benign essential microscopic hematuria: Secondary | ICD-10-CM | POA: Insufficient documentation

## 2016-08-05 LAB — URINALYSIS, ROUTINE W REFLEX MICROSCOPIC
Bilirubin Urine: NEGATIVE
GLUCOSE, UA: NEGATIVE mg/dL
Ketones, ur: NEGATIVE mg/dL
Nitrite: NEGATIVE
PROTEIN: NEGATIVE mg/dL
Specific Gravity, Urine: 1.02 (ref 1.005–1.030)
pH: 6 (ref 5.0–8.0)

## 2016-08-05 LAB — URINE MICROSCOPIC-ADD ON

## 2016-08-13 ENCOUNTER — Encounter: Payer: BLUE CROSS/BLUE SHIELD | Admitting: Family Medicine

## 2016-08-27 ENCOUNTER — Ambulatory Visit (INDEPENDENT_AMBULATORY_CARE_PROVIDER_SITE_OTHER): Payer: BLUE CROSS/BLUE SHIELD | Admitting: Family Medicine

## 2016-08-27 ENCOUNTER — Encounter: Payer: Self-pay | Admitting: Family Medicine

## 2016-08-27 VITALS — BP 144/100 | HR 93 | Temp 98.3°F | Resp 16 | Ht 62.0 in | Wt 222.0 lb

## 2016-08-27 DIAGNOSIS — I10 Essential (primary) hypertension: Secondary | ICD-10-CM

## 2016-08-27 DIAGNOSIS — Z23 Encounter for immunization: Secondary | ICD-10-CM | POA: Diagnosis not present

## 2016-08-27 DIAGNOSIS — E785 Hyperlipidemia, unspecified: Secondary | ICD-10-CM

## 2016-08-27 DIAGNOSIS — R4689 Other symptoms and signs involving appearance and behavior: Secondary | ICD-10-CM | POA: Diagnosis not present

## 2016-08-27 DIAGNOSIS — R7303 Prediabetes: Secondary | ICD-10-CM

## 2016-08-27 DIAGNOSIS — J302 Other seasonal allergic rhinitis: Secondary | ICD-10-CM

## 2016-08-27 MED ORDER — MONTELUKAST SODIUM 10 MG PO TABS: 10.0000 mg | ORAL_TABLET | Freq: Every day | ORAL | 3 refills | Status: DC

## 2016-08-27 MED ORDER — AZELASTINE HCL 0.1 % NA SOLN: 2.0000 | Freq: Two times a day (BID) | NASAL | 12 refills | Status: DC

## 2016-08-27 NOTE — Patient Instructions (Addendum)
F/u in 4.5 months, call if you need me before  Nurse BP check in 5 weeks  NEED to take blood pressure medication as prescribed or you will hurt your heart and kidneys and increase stroke risk  Nose spray and allergy tablet are sent in  Flu vaccine today  Pls sched and have mamamogram before year end  Call and arrange colonoscopy  Please work on good  health habits so that your health will improve. 1. Commitment to daily physical activity for 30 to 60  minutes, if you are able to do this.  2. Commitment to wise food choices. Aim for half of your  food intake to be vegetable and fruit, one quarter starchy foods, and one quarter protein. Try to eat on a regular schedule  3 meals per day, snacking between meals should be limited to vegetables or fruits or small portions of nuts. 64 ounces of water per day is generally recommended, unless you have specific health conditions, like heart failure or kidney failure where you will need to limit fluid intake.  3. Commitment to sufficient and a  good quality of physical and mental rest daily, generally between 6 to 8 hours per day.  WITH PERSISTANCE AND PERSEVERANCE, THE IMPOSSIBLE , BECOMES THE NORM!  Thank you  for choosing [facility]. We consider it a privelige to serve you.  Delivering excellent health care in a caring and  compassionate way is our goal.  Partnering with you,  so that together we can achieve this goal is our strategy.

## 2016-08-31 ENCOUNTER — Encounter: Payer: Self-pay | Admitting: Family Medicine

## 2016-08-31 NOTE — Assessment & Plan Note (Signed)
Hyperlipidemia:Low fat diet discussed and encouraged.   Lipid Panel  Lab Results  Component Value Date   CHOL 182 03/01/2015   HDL 49 03/01/2015   LDLCALC 118 (H) 03/01/2015   TRIG 75 03/01/2015   CHOLHDL 3.7 03/01/2015   Updated lab needed at/ before next visit.

## 2016-08-31 NOTE — Assessment & Plan Note (Signed)
Uncontrolled , with cough and drainage , nasal spray and oral med prescribed

## 2016-08-31 NOTE — Assessment & Plan Note (Signed)
Patient educated about the importance of limiting  Carbohydrate intake , the need to commit to daily physical activity for a minimum of 30 minutes , and to commit weight loss. The fact that changes in all these areas will reduce or eliminate all together the development of diabetes is stressed.  Updated lab needed at/ before next visit.   Diabetic Labs Latest Ref Rng & Units 03/20/2016 03/01/2015 11/13/2014 03/07/2014 01/26/2012  HbA1c <5.7 % - 5.4 - 5.3 5.7(H)  Chol 0 - 200 mg/dL - 182 - 177 176  HDL >=46 mg/dL - 49 - 50 54  Calc LDL 0 - 99 mg/dL - 118(H) - 112(H) 110(H)  Triglycerides <150 mg/dL - 75 - 73 58  Creatinine 0.50 - 1.05 mg/dL 0.75 0.67 0.70 0.91 0.66   BP/Weight 08/27/2016 07/03/2016 03/20/2016 11/21/2015 08/30/2015 08/06/2015 99991111  Systolic BP 123456 0000000 0000000 Q000111Q 123456 Q000111Q 0000000  Diastolic BP 123XX123 84 123XX123 96 81 100 104  Wt. (Lbs) 222 220 225 227 - - 224  BMI 40.6 40.24 41.14 41.51 - - 40.96   No flowsheet data found.

## 2016-08-31 NOTE — Assessment & Plan Note (Signed)
Deteriorated. Patient re-educated about  the importance of commitment to a  minimum of 150 minutes of exercise per week.  The importance of healthy food choices with portion control discussed. Encouraged to start a food diary, count calories and to consider  joining a support group. Sample diet sheets offered. Goals set by the patient for the next several months.   Weight /BMI 08/27/2016 07/03/2016 03/20/2016  WEIGHT 222 lb 220 lb 225 lb  HEIGHT 5\' 2"  5\' 2"  5\' 2"   BMI 40.6 kg/m2 40.24 kg/m2 41.14 kg/m2

## 2016-08-31 NOTE — Assessment & Plan Note (Signed)
Uncont4olled, non compliance remians a major problem'Educated re inmportance oif blood pressure management to reduce morbidity and mortality DASH diet and commitment to daily physical activity for a minimum of 30 minutes discussed and encouraged, as a part of hypertension management. The importance of attaining a healthy weight is also discussed.  BP/Weight 08/27/2016 07/03/2016 03/20/2016 11/21/2015 08/30/2015 08/06/2015 99991111  Systolic BP 123456 0000000 0000000 Q000111Q 123456 Q000111Q 0000000  Diastolic BP 123XX123 84 123XX123 96 81 100 104  Wt. (Lbs) 222 220 225 227 - - 224  BMI 40.6 40.24 41.14 41.51 - - 40.96   Nurse BP check in 5 weeks

## 2016-08-31 NOTE — Assessment & Plan Note (Signed)
Ongoing challenge and blood pressure remains uncontrolled as a  result

## 2016-08-31 NOTE — Progress Notes (Signed)
Michelle David     MRN: [id number]      DOB: 1961-04-08   HPI [name] is here for follow up and re-evaluation of chronic medical conditions, medication management and review of any available recent lab and radiology data.  Preventive health is updated, specifically  Cancer screening and Immunization.   Questions or concerns regarding consultations or procedures which the PT has had in the interim are  addressed. The PT denies any adverse reactions to current medications since the last visit.  C/po excessive cough with wheeze and clear drainage mainly, occasionally yellow, no fever, has had chills. Increased sinus drainage which is clear  ROS . Denies chest pains, palpitations and leg swelling Denies abdominal pain, nausea, vomiting,diarrhea or constipation.   Denies dysuria, frequency, hesitancy or incontinence. Denies joint pain, swelling and limitation in mobility. Denies headaches, seizures, numbness, or tingling. Denies depression, anxiety or insomnia. Denies skin break down or rash.   PE  BP (!) 144/100   Pulse 93   Temp 98.3 F (36.8 C) (Oral)   Resp 16   Ht 5\' 2"  (1.575 m)   Wt 222 lb (100.7 kg)   SpO2 98%   BMI 40.60 kg/m   Patient alert and oriented and in no cardiopulmonary distress.  HEENT: No facial asymmetry, EOMI,   oropharynx pink and moist.  Neck supple no JVD, no mass.Erythema and edema of nasal mucosa, TM clear  Chest: Clear to auscultation bilaterally.  CVS: S1, S2 no murmurs, no S3.Regular rate.  ABD: Soft non tender.   Ext: No edema  MS: Adequate ROM spine, shoulders, hips and knees.  Skin: Intact, no ulcerations or rash noted.  Psych: Good eye contact, normal affect. Memory intact not anxious or depressed appearing.  CNS: CN 2-12 intact, power,  normal throughout.no focal deficits noted.   Assessment & Plan  HTN (hypertension), malignant Uncont4olled, non compliance remians a major problem'Educated re inmportance oif blood  pressure management to reduce morbidity and mortality DASH diet and commitment to daily physical activity for a minimum of 30 minutes discussed and encouraged, as a part of hypertension management. The importance of attaining a healthy weight is also discussed.  BP/Weight 08/27/2016 07/03/2016 03/20/2016 11/21/2015 08/30/2015 08/06/2015 99991111  Systolic BP 123456 0000000 0000000 Q000111Q 123456 Q000111Q 0000000  Diastolic BP 123XX123 84 123XX123 96 81 100 104  Wt. (Lbs) 222 220 225 227 - - 224  BMI 40.6 40.24 41.14 41.51 - - 40.96   Nurse BP check in 5 weeks    Morbid obesity Deteriorated. Patient re-educated about  the importance of commitment to a  minimum of 150 minutes of exercise per week.  The importance of healthy food choices with portion control discussed. Encouraged to start a food diary, count calories and to consider  joining a support group. Sample diet sheets offered. Goals set by the patient for the next several months.   Weight /BMI 08/27/2016 07/03/2016 03/20/2016  WEIGHT 222 lb 220 lb 225 lb  HEIGHT 5\' 2"  5\' 2"  5\' 2"   BMI 40.6 kg/m2 40.24 kg/m2 41.14 kg/m2      Non-compliant behavior Ongoing challenge and blood pressure remains uncontrolled as a  result  Seasonal allergies Uncontrolled , with cough and drainage , nasal spray and oral med prescribed  Hyperlipidemia LDL goal <100 Hyperlipidemia:Low fat diet discussed and encouraged.   Lipid Panel  Lab Results  Component Value Date   CHOL 182 03/01/2015   HDL 49 03/01/2015   LDLCALC 118 (H) 03/01/2015  TRIG 75 03/01/2015   CHOLHDL 3.7 03/01/2015   Updated lab needed at/ before next visit.     Prediabetes Patient educated about the importance of limiting  Carbohydrate intake , the need to commit to daily physical activity for a minimum of 30 minutes , and to commit weight loss. The fact that changes in all these areas will reduce or eliminate all together the development of diabetes is stressed.  Updated lab needed at/ before next  visit.   Diabetic Labs Latest Ref Rng & Units 03/20/2016 03/01/2015 11/13/2014 03/07/2014 01/26/2012  HbA1c <5.7 % - 5.4 - 5.3 5.7(H)  Chol 0 - 200 mg/dL - 182 - 177 176  HDL >=46 mg/dL - 49 - 50 54  Calc LDL 0 - 99 mg/dL - 118(H) - 112(H) 110(H)  Triglycerides <150 mg/dL - 75 - 73 58  Creatinine 0.50 - 1.05 mg/dL 0.75 0.67 0.70 0.91 0.66   BP/Weight 08/27/2016 07/03/2016 03/20/2016 11/21/2015 08/30/2015 08/06/2015 99991111  Systolic BP 123456 0000000 0000000 Q000111Q 123456 Q000111Q 0000000  Diastolic BP 123XX123 84 123XX123 96 81 100 104  Wt. (Lbs) 222 220 225 227 - - 224  BMI 40.6 40.24 41.14 41.51 - - 40.96   No flowsheet data found.

## 2016-09-02 ENCOUNTER — Telehealth: Payer: Self-pay | Admitting: Family Medicine

## 2016-09-02 NOTE — Telephone Encounter (Signed)
Michelle David is asking for a refill on triamterene-hydrochlorothiazide (MAXZIDE) 75-50 MG tablet

## 2016-09-03 ENCOUNTER — Other Ambulatory Visit: Payer: Self-pay

## 2016-09-03 ENCOUNTER — Other Ambulatory Visit (INDEPENDENT_AMBULATORY_CARE_PROVIDER_SITE_OTHER): Payer: Self-pay | Admitting: Otolaryngology

## 2016-09-03 DIAGNOSIS — I10 Essential (primary) hypertension: Secondary | ICD-10-CM

## 2016-09-03 DIAGNOSIS — E041 Nontoxic single thyroid nodule: Secondary | ICD-10-CM

## 2016-09-03 MED ORDER — TRIAMTERENE-HCTZ 75-50 MG PO TABS: 1.0000 | ORAL_TABLET | Freq: Every day | ORAL | 3 refills | Status: DC

## 2016-09-03 NOTE — Telephone Encounter (Signed)
Medication refilled

## 2016-09-05 ENCOUNTER — Other Ambulatory Visit: Payer: Self-pay | Admitting: Family Medicine

## 2016-09-05 DIAGNOSIS — Z1231 Encounter for screening mammogram for malignant neoplasm of breast: Secondary | ICD-10-CM

## 2016-09-17 ENCOUNTER — Ambulatory Visit (HOSPITAL_COMMUNITY)
Admission: RE | Admit: 2016-09-17 | Discharge: 2016-09-17 | Disposition: A | Discharge status: Home / Self Care | Payer: BLUE CROSS/BLUE SHIELD | Source: Ambulatory Visit | Attending: Otolaryngology | Admitting: Otolaryngology

## 2016-09-17 DIAGNOSIS — E041 Nontoxic single thyroid nodule: Secondary | ICD-10-CM

## 2016-09-22 ENCOUNTER — Ambulatory Visit (HOSPITAL_COMMUNITY): Payer: BLUE CROSS/BLUE SHIELD

## 2016-09-30 ENCOUNTER — Ambulatory Visit: Payer: BLUE CROSS/BLUE SHIELD | Admitting: Urology

## 2016-10-02 ENCOUNTER — Ambulatory Visit (INDEPENDENT_AMBULATORY_CARE_PROVIDER_SITE_OTHER): Payer: BLUE CROSS/BLUE SHIELD | Admitting: Otolaryngology

## 2016-12-17 ENCOUNTER — Encounter: Payer: Self-pay | Admitting: Family Medicine

## 2016-12-17 ENCOUNTER — Ambulatory Visit (HOSPITAL_COMMUNITY)
Admission: RE | Admit: 2016-12-17 | Discharge: 2016-12-17 | Disposition: A | Discharge status: Home / Self Care | Payer: BLUE CROSS/BLUE SHIELD | Source: Ambulatory Visit | Attending: Family Medicine | Admitting: Family Medicine

## 2016-12-17 ENCOUNTER — Ambulatory Visit (INDEPENDENT_AMBULATORY_CARE_PROVIDER_SITE_OTHER): Payer: BLUE CROSS/BLUE SHIELD | Admitting: Family Medicine

## 2016-12-17 VITALS — BP 160/94 | HR 100 | Temp 98.8°F | Resp 18 | Ht 62.0 in | Wt 215.1 lb

## 2016-12-17 DIAGNOSIS — J40 Bronchitis, not specified as acute or chronic: Secondary | ICD-10-CM | POA: Diagnosis not present

## 2016-12-17 DIAGNOSIS — R059 Cough, unspecified: Secondary | ICD-10-CM

## 2016-12-17 DIAGNOSIS — G4730 Sleep apnea, unspecified: Secondary | ICD-10-CM | POA: Insufficient documentation

## 2016-12-17 DIAGNOSIS — E785 Hyperlipidemia, unspecified: Secondary | ICD-10-CM | POA: Diagnosis not present

## 2016-12-17 DIAGNOSIS — R05 Cough: Secondary | ICD-10-CM | POA: Insufficient documentation

## 2016-12-17 DIAGNOSIS — J302 Other seasonal allergic rhinitis: Secondary | ICD-10-CM | POA: Diagnosis not present

## 2016-12-17 DIAGNOSIS — R4689 Other symptoms and signs involving appearance and behavior: Secondary | ICD-10-CM | POA: Diagnosis not present

## 2016-12-17 DIAGNOSIS — I10 Essential (primary) hypertension: Secondary | ICD-10-CM | POA: Diagnosis not present

## 2016-12-17 DIAGNOSIS — R7303 Prediabetes: Secondary | ICD-10-CM

## 2016-12-17 HISTORY — DX: Sleep apnea, unspecified: G47.30

## 2016-12-17 NOTE — Assessment & Plan Note (Signed)
Snoring , excessive fatigue and daytime sleepiness, needs sleep study and treatment

## 2016-12-17 NOTE — Patient Instructions (Addendum)
Annual physical exam 2nd week inJune or after, call if you need me before  You need sleep study and I have referred you , Dr Freddie Apley office will call  Please get cXR today  Fasting lipid, cmp, cBC , TSH June 1 or shortly after  Medication for allergies and wheezing every day, singulair and astellin You need mammogram  Blood pressure med every day is triamterene  Please work on good  health habits so that your health will improve. 1. Commitment to daily physical activity for 30 to 60  minutes, if you are able to do this.  2. Commitment to wise food choices. Aim for half of your  food intake to be vegetable and fruit, one quarter starchy foods, and one quarter protein. Try to eat on a regular schedule  3 meals per day, snacking between meals should be limited to vegetables or fruits or small portions of nuts. 64 ounces of water per day is generally recommended, unless you have specific health conditions, like heart failure or kidney failure where you will need to limit fluid intake.  3. Commitment to sufficient and a  good quality of physical and mental rest daily, generally between 6 to 8 hours per day.  WITH PERSISTANCE AND PERSEVERANCE, THE IMPOSSIBLE , BECOMES THE NORM! Thank you  for choosing [facility]. We consider it a privelige to serve you.  Delivering excellent health care in a caring and  compassionate way is our goal.  Partnering with you,  so that together we can achieve this goal is our strategy.

## 2016-12-17 NOTE — Assessment & Plan Note (Addendum)
Cough x month, with uncontrolled allergy symptoms, CXR needed

## 2016-12-18 ENCOUNTER — Ambulatory Visit: Payer: BLUE CROSS/BLUE SHIELD | Admitting: Family Medicine

## 2016-12-21 ENCOUNTER — Encounter: Payer: Self-pay | Admitting: Family Medicine

## 2016-12-21 NOTE — Assessment & Plan Note (Signed)
Uncontrolled due to non compliance, re educated re importance of same DASH diet and commitment to daily physical activity for a minimum of 30 minutes discussed and encouraged, as a part of hypertension management. The importance of attaining a healthy weight is also discussed.  BP/Weight 12/17/2016 08/27/2016 07/03/2016 03/20/2016 11/21/2015 08/30/2015 Q000111Q  Systolic BP 0000000 123456 0000000 0000000 Q000111Q 123456 Q000111Q  Diastolic BP 94 123XX123 84 123XX123 96 81 100  Wt. (Lbs) 215.12 222 220 225 227 - -  BMI 39.35 40.6 40.24 41.14 41.51 - -

## 2016-12-21 NOTE — Progress Notes (Signed)
   Michelle David     MRN: [id number]      DOB: January 09, 1961   HPI Michelle David is here for follow up and re-evaluation of chronic medical conditions, medication management and review of any available recent lab and radiology data.  Preventive health is updated, specifically  Cancer screening and Immunization.   C/o cough and shortness of breath x 2 months , with poor exercise tolerance , c/o wheezing C/o chronic fatigue and shortness of breath with excessive snoring  ROS Denies recent fever or chills. C/o  sinus pressure, nasal congestion, denies ear pain or sore throat. Denies chest pains, palpitations and leg swelling Denies abdominal pain, nausea, vomiting,diarrhea or constipation.   Denies dysuria, frequency, hesitancy or incontinence. Denies joint pain, swelling and limitation in mobility. Denies headaches, seizures, numbness, or tingling. Denies depression, anxiety or insomnia. Denies skin break down or rash.   PE  BP (!) 160/94   Pulse 100   Temp 98.8 F (37.1 C)   Resp 18   Ht 5\' 2"  (1.575 m)   Wt 215 lb 1.9 oz (97.6 kg)   SpO2 96%   BMI 39.35 kg/m   Patient alert and oriented and in no cardiopulmonary distress.  HEENT: No facial asymmetry, EOMI,   oropharynx pink and moist.  Neck supple no JVD, no mass.Erythema and edema of nasal mucosa, no sinus tenderness, TM clear  Chest: decreased though adequate air entry, few wheezes, no crackles  CVS: S1, S2 no murmurs, no S3.Regular rate.  ABD: Soft non tender.   Ext: No edema  MS: Adequate ROM spine, shoulders, hips and knees.  Skin: Intact, no ulcerations or rash noted.  Psych: Good eye contact, normal affect. Memory intact not anxious or depressed appearing.  CNS: CN 2-12 intact, power,  normal throughout.no focal deficits noted.   Assessment & Plan  Sleep apnea Snoring , excessive fatigue and daytime sleepiness, needs sleep study and treatment  Cough Cough x month, with uncontrolled allergy symptoms,  CXR needed  HTN (hypertension), malignant Uncontrolled due to non compliance, re educated re importance of same DASH diet and commitment to daily physical activity for a minimum of 30 minutes discussed and encouraged, as a part of hypertension management. The importance of attaining a healthy weight is also discussed.  BP/Weight 12/17/2016 08/27/2016 07/03/2016 03/20/2016 11/21/2015 08/30/2015 Q000111Q  Systolic BP 0000000 123456 0000000 0000000 Q000111Q 123456 Q000111Q  Diastolic BP 94 123XX123 84 123XX123 96 81 100  Wt. (Lbs) 215.12 222 220 225 227 - -  BMI 39.35 40.6 40.24 41.14 41.51 - -       Seasonal allergies Increased and uncontrolled resulting in chronic cough and wheeze, needs to commit to daily medication  Morbid obesity Improved. Patient re-educated about  the importance of commitment to a  minimum of 150 minutes of exercise per week.  The importance of healthy food choices with portion control discussed. Encouraged to start a food diary, count calories and to consider  joining a support group. Sample diet sheets offered. Goals set by the patient for the next several months.   Weight /BMI 12/17/2016 08/27/2016 07/03/2016  WEIGHT 215 lb 1.9 oz 222 lb 220 lb  HEIGHT 5\' 2"  5\' 2"  5\' 2"   BMI 39.35 kg/m2 40.6 kg/m2 40.24 kg/m2      Non-compliant behavior Ongoing challenge which is being addressed

## 2016-12-21 NOTE — Assessment & Plan Note (Signed)
Ongoing challenge which is being addressed

## 2016-12-21 NOTE — Assessment & Plan Note (Signed)
Increased and uncontrolled resulting in chronic cough and wheeze, needs to commit to daily medication

## 2016-12-21 NOTE — Assessment & Plan Note (Signed)
Improved. Patient re-educated about  the importance of commitment to a  minimum of 150 minutes of exercise per week.  The importance of healthy food choices with portion control discussed. Encouraged to start a food diary, count calories and to consider  joining a support group. Sample diet sheets offered. Goals set by the patient for the next several months.   Weight /BMI 12/17/2016 08/27/2016 07/03/2016  WEIGHT 215 lb 1.9 oz 222 lb 220 lb  HEIGHT 5\' 2"  5\' 2"  5\' 2"   BMI 39.35 kg/m2 40.6 kg/m2 40.24 kg/m2

## 2016-12-22 ENCOUNTER — Telehealth: Payer: Self-pay | Admitting: Family Medicine

## 2016-12-22 DIAGNOSIS — I10 Essential (primary) hypertension: Secondary | ICD-10-CM

## 2016-12-22 MED ORDER — TRIAMTERENE-HCTZ 75-50 MG PO TABS: 1.0000 | ORAL_TABLET | Freq: Every day | ORAL | 1 refills | Status: DC

## 2016-12-22 NOTE — Telephone Encounter (Signed)
Done

## 2016-12-22 NOTE — Telephone Encounter (Signed)
Michelle David is calling stating that she needed her meds refilled, please advise?

## 2016-12-23 NOTE — Telephone Encounter (Signed)
Patient is aware 

## 2016-12-26 ENCOUNTER — Ambulatory Visit (HOSPITAL_COMMUNITY): Payer: BLUE CROSS/BLUE SHIELD

## 2016-12-31 ENCOUNTER — Ambulatory Visit: Payer: BLUE CROSS/BLUE SHIELD | Admitting: Family Medicine

## 2017-01-01 ENCOUNTER — Ambulatory Visit (HOSPITAL_COMMUNITY): Payer: BLUE CROSS/BLUE SHIELD

## 2017-01-15 ENCOUNTER — Inpatient Hospital Stay (HOSPITAL_COMMUNITY)
Admission: EM | Admit: 2017-01-15 | Discharge: 2017-01-20 | DRG: 292 | Disposition: A | Discharge status: Home / Self Care | Payer: BLUE CROSS/BLUE SHIELD | Attending: Internal Medicine | Admitting: Internal Medicine

## 2017-01-15 ENCOUNTER — Emergency Department (HOSPITAL_COMMUNITY): Payer: BLUE CROSS/BLUE SHIELD

## 2017-01-15 ENCOUNTER — Encounter (HOSPITAL_COMMUNITY): Payer: Self-pay

## 2017-01-15 DIAGNOSIS — I5041 Acute combined systolic (congestive) and diastolic (congestive) heart failure: Secondary | ICD-10-CM | POA: Diagnosis not present

## 2017-01-15 DIAGNOSIS — E785 Hyperlipidemia, unspecified: Secondary | ICD-10-CM | POA: Diagnosis not present

## 2017-01-15 DIAGNOSIS — I10 Essential (primary) hypertension: Secondary | ICD-10-CM | POA: Diagnosis present

## 2017-01-15 DIAGNOSIS — Z9119 Patient's noncompliance with other medical treatment and regimen: Secondary | ICD-10-CM

## 2017-01-15 DIAGNOSIS — R079 Chest pain, unspecified: Secondary | ICD-10-CM | POA: Diagnosis present

## 2017-01-15 DIAGNOSIS — J302 Other seasonal allergic rhinitis: Secondary | ICD-10-CM | POA: Diagnosis present

## 2017-01-15 DIAGNOSIS — R7302 Impaired glucose tolerance (oral): Secondary | ICD-10-CM | POA: Diagnosis present

## 2017-01-15 DIAGNOSIS — I509 Heart failure, unspecified: Secondary | ICD-10-CM

## 2017-01-15 DIAGNOSIS — J069 Acute upper respiratory infection, unspecified: Secondary | ICD-10-CM | POA: Diagnosis present

## 2017-01-15 DIAGNOSIS — Z90722 Acquired absence of ovaries, bilateral: Secondary | ICD-10-CM

## 2017-01-15 DIAGNOSIS — Z6838 Body mass index (BMI) 38.0-38.9, adult: Secondary | ICD-10-CM

## 2017-01-15 DIAGNOSIS — I110 Hypertensive heart disease with heart failure: Secondary | ICD-10-CM | POA: Diagnosis not present

## 2017-01-15 DIAGNOSIS — E876 Hypokalemia: Secondary | ICD-10-CM | POA: Diagnosis present

## 2017-01-15 DIAGNOSIS — R7303 Prediabetes: Secondary | ICD-10-CM | POA: Diagnosis present

## 2017-01-15 DIAGNOSIS — I248 Other forms of acute ischemic heart disease: Secondary | ICD-10-CM | POA: Diagnosis present

## 2017-01-15 DIAGNOSIS — Z79899 Other long term (current) drug therapy: Secondary | ICD-10-CM

## 2017-01-15 DIAGNOSIS — Z8249 Family history of ischemic heart disease and other diseases of the circulatory system: Secondary | ICD-10-CM

## 2017-01-15 LAB — LIPID PANEL
Cholesterol: 188 mg/dL (ref 0–200)
HDL: 45 mg/dL (ref >40)
LDL CALC: 117 mg/dL — AB (ref 0–99)
Total CHOL/HDL Ratio: 4.2 RATIO
Triglycerides: 130 mg/dL (ref <150)
VLDL: 26 mg/dL (ref 0–40)

## 2017-01-15 LAB — BASIC METABOLIC PANEL
Anion gap: 8 (ref 5–15)
BUN: 25 mg/dL — ABNORMAL HIGH (ref 6–20)
CO2: 27 mmol/L (ref 22–32)
Calcium: 9.1 mg/dL (ref 8.9–10.3)
Chloride: 104 mmol/L (ref 101–111)
Creatinine, Ser: 1.02 mg/dL — ABNORMAL HIGH (ref 0.44–1.00)
GFR calc Af Amer: >60 mL/min (ref >60)
GFR calc non Af Amer: >60 mL/min (ref >60)
Glucose, Bld: 81 mg/dL (ref 65–99)
Potassium: 3.5 mmol/L (ref 3.5–5.1)
Sodium: 139 mmol/L (ref 135–145)

## 2017-01-15 LAB — RAPID URINE DRUG SCREEN, HOSP PERFORMED
AMPHETAMINES: NOT DETECTED
Barbiturates: NOT DETECTED
Benzodiazepines: NOT DETECTED
Cocaine: NOT DETECTED
OPIATES: NOT DETECTED
Tetrahydrocannabinol: NOT DETECTED

## 2017-01-15 LAB — TROPONIN I
TROPONIN I: 0.03 ng/mL — AB (ref <0.03)
Troponin I: 0.03 ng/mL (ref <0.03)

## 2017-01-15 LAB — CBC WITH DIFFERENTIAL/PLATELET
Basophils Absolute: 0 10*3/uL (ref 0.0–0.1)
Basophils Relative: 0 %
Eosinophils Absolute: 0.1 10*3/uL (ref 0.0–0.7)
Eosinophils Relative: 2 %
HCT: 38.9 % (ref 36.0–46.0)
Hemoglobin: 12.4 g/dL (ref 12.0–15.0)
Lymphocytes Relative: 42 %
Lymphs Abs: 2.2 10*3/uL (ref 0.7–4.0)
MCH: 28.1 pg (ref 26.0–34.0)
MCHC: 31.9 g/dL (ref 30.0–36.0)
MCV: 88.2 fL (ref 78.0–100.0)
Monocytes Absolute: 0.3 10*3/uL (ref 0.1–1.0)
Monocytes Relative: 5 %
Neutro Abs: 2.7 10*3/uL (ref 1.7–7.7)
Neutrophils Relative %: 51 %
Platelets: 399 10*3/uL (ref 150–400)
RBC: 4.41 MIL/uL (ref 3.87–5.11)
RDW: 15.1 % (ref 11.5–15.5)
WBC: 5.3 10*3/uL (ref 4.0–10.5)

## 2017-01-15 LAB — BRAIN NATRIURETIC PEPTIDE: B Natriuretic Peptide: 622 pg/mL — ABNORMAL HIGH (ref 0.0–100.0)

## 2017-01-15 LAB — TSH: TSH: 2.699 u[IU]/mL (ref 0.350–4.500)

## 2017-01-15 MED ORDER — SODIUM CHLORIDE 0.9 % IV SOLN
250.0000 mL | INTRAVENOUS | Status: DC | PRN
Start: 2017-01-15 — End: 2017-01-20

## 2017-01-15 MED ORDER — ASPIRIN 81 MG PO CHEW
324.0000 mg | CHEWABLE_TABLET | Freq: Once | ORAL | Status: AC
  Administered 2017-01-15: 324 mg via ORAL
  Filled 2017-01-15: qty 4

## 2017-01-15 MED ORDER — AZELASTINE HCL 0.1 % NA SOLN
NASAL | Status: AC
  Filled 2017-01-15: qty 30

## 2017-01-15 MED ORDER — FUROSEMIDE 10 MG/ML IJ SOLN
60.0000 mg | Freq: Once | INTRAMUSCULAR | Status: AC
  Administered 2017-01-15: 60 mg via INTRAVENOUS
  Filled 2017-01-15: qty 6

## 2017-01-15 MED ORDER — ASPIRIN EC 81 MG PO TBEC
81.0000 mg | DELAYED_RELEASE_TABLET | Freq: Every day | ORAL | Status: DC
  Administered 2017-01-15 – 2017-01-20 (×6): 81 mg via ORAL
  Filled 2017-01-15 (×6): qty 1

## 2017-01-15 MED ORDER — ENOXAPARIN SODIUM 40 MG/0.4ML ~~LOC~~ SOLN
40.0000 mg | SUBCUTANEOUS | Status: DC
  Administered 2017-01-15 – 2017-01-18 (×4): 40 mg via SUBCUTANEOUS
  Filled 2017-01-15 (×5): qty 0.4

## 2017-01-15 MED ORDER — SODIUM CHLORIDE 0.9% FLUSH: 3.0000 mL | INTRAVENOUS | Status: DC | PRN

## 2017-01-15 MED ORDER — ACETAMINOPHEN 325 MG PO TABS: 650.0000 mg | ORAL_TABLET | ORAL | Status: DC | PRN

## 2017-01-15 MED ORDER — LISINOPRIL 5 MG PO TABS
5.0000 mg | ORAL_TABLET | Freq: Every day | ORAL | Status: DC
  Administered 2017-01-15 – 2017-01-17 (×3): 5 mg via ORAL
  Filled 2017-01-15 (×3): qty 1

## 2017-01-15 MED ORDER — ONDANSETRON HCL 4 MG/2ML IJ SOLN: 4.0000 mg | Freq: Four times a day (QID) | INTRAMUSCULAR | Status: DC | PRN

## 2017-01-15 MED ORDER — SODIUM CHLORIDE 0.9% FLUSH
3.0000 mL | Freq: Two times a day (BID) | INTRAVENOUS | Status: DC
  Administered 2017-01-15 – 2017-01-20 (×10): 3 mL via INTRAVENOUS

## 2017-01-15 MED ORDER — MONTELUKAST SODIUM 10 MG PO TABS
10.0000 mg | ORAL_TABLET | Freq: Every day | ORAL | Status: DC
  Administered 2017-01-15 – 2017-01-19 (×5): 10 mg via ORAL
  Filled 2017-01-15 (×5): qty 1

## 2017-01-15 MED ORDER — AZELASTINE HCL 0.1 % NA SOLN
2.0000 | Freq: Two times a day (BID) | NASAL | Status: DC
  Administered 2017-01-15 – 2017-01-20 (×10): 2 via NASAL
  Filled 2017-01-15: qty 30

## 2017-01-15 MED ORDER — ATORVASTATIN CALCIUM 20 MG PO TABS
20.0000 mg | ORAL_TABLET | Freq: Every day | ORAL | Status: DC
  Administered 2017-01-15 – 2017-01-16 (×2): 20 mg via ORAL
  Filled 2017-01-15 (×2): qty 1

## 2017-01-15 MED ORDER — FUROSEMIDE 10 MG/ML IJ SOLN
20.0000 mg | Freq: Once | INTRAMUSCULAR | Status: AC
  Administered 2017-01-15: 20 mg via INTRAVENOUS
  Filled 2017-01-15: qty 2

## 2017-01-15 NOTE — ED Provider Notes (Signed)
[facility] DEPT Provider Note   CSN: [id number] Arrival date & time: 01/15/17  1126   By signing my name below, I, Hilbert Odor, attest that this documentation has been prepared under the direction and in the presence of Virgel Manifold, MD. Electronically Signed: Hilbert Odor, Scribe. 01/15/17. 1:06 PM. History   Chief Complaint Chief Complaint  Patient presents with  . Shortness of Breath    The history is provided by the patient. No language interpreter was used.  HPI Comments: Michelle David is a 56 y.o. female who presents to the Emergency Department complaining of SOB for the past month. She states that her SOB is worse upon exertion and laying flat. She also reports a productive cough for the past month. The patient states that she was seen last month by Dr. Moshe Cipro and was diagnosed with bronchitis per CXR. The patient is not currently on any antibiotics. She has not taken anything for her symptoms. She denies leg swelling, fevers, CP, and back pain.  Past Medical History:  Diagnosis Date  . Dyspepsia   . Essential hypertension, benign   . Impaired glucose tolerance   . Obesity     Patient Active Problem List   Diagnosis Date Noted  . Sleep apnea 12/17/2016  . Cough 12/17/2016  . Knee pain, right 03/01/2015  . Solitary thyroid nodule 03/01/2015  . Dyslipidemia 06/26/2014  . Seasonal allergies 04/24/2014  . Prediabetes 05/29/2013  . Hyperlipidemia LDL goal <100 05/29/2013  . Metabolic syndrome X 40/98/1191  . Non-compliant behavior 05/29/2013  . Heart murmur 04/22/2011  . Morbid obesity 03/31/2008  . HTN (hypertension), malignant 03/31/2008  . DYSPEPSIA 03/31/2008    Past Surgical History:  Procedure Laterality Date  . OOPHORECTOMY    . TUBAL LIGATION      OB History    No data available       Home Medications    Prior to Admission medications   Medication Sig Start Date End Date Taking? Authorizing Provider  azelastine (ASTELIN) 0.1 %  nasal spray Place 2 sprays into both nostrils 2 (two) times daily. Use in each nostril as directed Patient not taking: Reported on 12/17/2016 08/27/16   Fayrene Helper, MD  Cetirizine HCl (ZYRTEC ALLERGY) 10 MG TBDP Reported on 03/20/2016 03/01/15   Fayrene Helper, MD  montelukast (SINGULAIR) 10 MG tablet Take 1 tablet (10 mg total) by mouth at bedtime. Patient not taking: Reported on 12/17/2016 08/27/16   Fayrene Helper, MD  ranitidine (ZANTAC) 300 MG tablet TAKE 1 TABLET(300 MG) BY MOUTH AT BEDTIME Patient not taking: Reported on 12/17/2016 08/01/16   Fayrene Helper, MD  triamterene-hydrochlorothiazide (MAXZIDE) 75-50 MG tablet Take 1 tablet by mouth daily. 12/22/16   Fayrene Helper, MD    Family History Family History  Problem Relation Age of Onset  . Heart disease Mother     Died in her 54s with MI  . Hypertension Mother     Social History Social History  Substance Use Topics  . Smoking status: Never Smoker  . Smokeless tobacco: Never Used  . Alcohol use No     Allergies   Patient has no known allergies.   Review of Systems Review of Systems A complete 10 system review of systems was obtained and all systems are negative except as noted in the HPI and PMH.   Physical Exam Updated Vital Signs BP 140/89   Pulse (!) 102   Temp 98.4 F (36.9 C)   Resp (!) 26  Ht 5\' 2"  (1.575 m)   Wt 210 lb (95.3 kg)   SpO2 99%   BMI 38.41 kg/m   Physical Exam  Constitutional: She is oriented to person, place, and time. She appears well-developed and well-nourished.  Frequent coughing.  HENT:  Head: Normocephalic and atraumatic.  Eyes: Conjunctivae are normal.  Neck: Neck supple.  Cardiovascular: Regular rhythm.  Tachycardia present.   Mildly tachycardia.  Pulmonary/Chest: Effort normal. She has decreased breath sounds.  Decreased breath sounds bilaterally. No wheezing appreciated.  Abdominal: Soft. Bowel sounds are normal.  Musculoskeletal: Normal range of  motion.  Neurological: She is alert and oriented to person, place, and time.  Skin: Skin is warm and dry.  Psychiatric: She has a normal mood and affect. Her behavior is normal.  Nursing note and vitals reviewed.   ED Treatments / Results  DIAGNOSTIC STUDIES: Oxygen Saturation is 99% on RA, normal by my interpretation.    COORDINATION OF CARE: 12:51 PM Discussed treatment plan with pt at bedside and pt agreed to plan. I will check the patient's CXR and labs.  Labs (all labs ordered are listed, but only abnormal results are displayed) Labs Reviewed  BASIC METABOLIC PANEL - Abnormal; Notable for the following:       Result Value   BUN 25 (*)    Creatinine, Ser 1.02 (*)    All other components within normal limits  BRAIN NATRIURETIC PEPTIDE - Abnormal; Notable for the following:    B Natriuretic Peptide 622.0 (*)    All other components within normal limits  TROPONIN I - Abnormal; Notable for the following:    Troponin I 0.03 (*)    All other components within normal limits  CBC WITH DIFFERENTIAL/PLATELET - Abnormal; Notable for the following:    Hemoglobin 11.4 (*)    HCT 35.4 (*)    All other components within normal limits  TROPONIN I - Abnormal; Notable for the following:    Troponin I 0.03 (*)    All other components within normal limits  TROPONIN I - Abnormal; Notable for the following:    Troponin I 0.03 (*)    All other components within normal limits  LIPID PANEL - Abnormal; Notable for the following:    LDL Cholesterol 117 (*)    All other components within normal limits  BASIC METABOLIC PANEL - Abnormal; Notable for the following:    Potassium 3.2 (*)    BUN 21 (*)    Calcium 8.6 (*)    All other components within normal limits  BASIC METABOLIC PANEL - Abnormal; Notable for the following:    BUN 23 (*)    All other components within normal limits  BASIC METABOLIC PANEL - Abnormal; Notable for the following:    Chloride 100 (*)    BUN 27 (*)    Creatinine,  Ser 1.04 (*)    All other components within normal limits  BASIC METABOLIC PANEL - Abnormal; Notable for the following:    Chloride 99 (*)    CO2 33 (*)    BUN 27 (*)    Creatinine, Ser 1.11 (*)    GFR calc non Af Amer 56 (*)    All other components within normal limits  CBC WITH DIFFERENTIAL/PLATELET  TROPONIN I  TSH  RAPID URINE DRUG SCREEN, HOSP PERFORMED  MAGNESIUM  CBC  MAGNESIUM  HEMOGLOBIN A1C    EKG  EKG Interpretation  Date/Time:  Thursday January 15 2017 12:04:35 EDT Ventricular Rate:  104 PR Interval:  QRS Duration: 101 QT Interval:  336 QTC Calculation: 442 R Axis:   50 Text Interpretation:  Sinus tachycardia Probable LVH with secondary repol abnrm Confirmed by Wilson Singer  MD, Lisle Skillman [phone]) on 01/15/2017 12:33:49 PM       Radiology Dg Chest 2 View  Result Date: 01/15/2017 CLINICAL DATA:  Shortness of Breath EXAM: CHEST  2 VIEW COMPARISON:  12/17/2016 FINDINGS: Cardiac shadow is at the upper limits of normal in size but stable. Lungs are well aerated bilaterally. Mild increase in vascular congestion is noted without interstitial edema. No focal infiltrate or effusion is seen. No acute bony abnormality is noted. IMPRESSION: Mild vascular congestion without interstitial edema. Electronically Signed   By: Inez Catalina M.D.   On: 01/15/2017 12:08    Procedures Procedures (including critical care time)  Medications Ordered in ED Medications - No data to display   Initial Impression / Assessment and Plan / ED Course  I have reviewed the triage vital signs and the nursing notes.  Pertinent labs & imaging results that were available during my care of the patient were reviewed by me and considered in my medical decision making (see chart for details).     56 year old female with what I clinically suspect his heart failure. No prior diagnosis. Given new diagnosis in her degree of symptoms, will admit for further evaluation and ongoing diuresis.  Final Clinical  Impressions(s) / ED Diagnoses   Final diagnoses:  None    New Prescriptions New Prescriptions   No medications on file   I personally preformed the services scribed in my presence. The recorded information has been reviewed is accurate. Virgel Manifold, MD.    Virgel Manifold, MD 01/19/17 [phone]

## 2017-01-15 NOTE — ED Triage Notes (Signed)
Pt reports that she was told by Dr Moshe Cipro she had bronchitis per CXR. Not currently on antibiotics. States SOB started this morning and is coughing more and producing more mucous. SOB worse with exertion

## 2017-01-15 NOTE — H&P (Signed)
History and Physical    Michelle David:[id number] DOB: Jan 27, 1961 DOA: 01/15/2017  PCP: Tula Nakayama, MD Consultants:  Endocrinology (thyroid); Neurology (first appt 4/10)- [place] Patient coming from: home - lives with husband and daughter and 2 grandchildren; NOK: husband, [phone]; sister-in-law, [phone]  Chief Complaint: SOB  HPI: Michelle David is a 56 y.o. female with medical history significant of morbid obesity, impaired glucose tolerance, and HTN presenting because she has been feeling "rough."  Came in because she couldn't breathe, has been feeling like this for a bout a month.  Unable to lie flat.  Has to sit down while walking because she can't catch her breath.  +cough, nonproductive, constant.  No fevers.  Some congestion.  +nausea.  Has not noticed LE edema.  Takes fluid pills (Maxzide), goes to the bathroom a lot.   Normal echocardiogram in 2012.  +PND.  3-pillow orthopnea.  Has seen her PCP and diagnosed with bronchitis.  Today, she was at work and her supervisor sent her to the ER because her face was flushed and she was so SOB.  Funny feeling in chest, queasy funny feeling, +pressure.  She saw Dr. Moshe Cipro on 2/28 and at that time complained of cough and SOB x 2 months with poor exercise tolerance and wheezing, chronic fatigue, excessive snoring.  She was diagnosed with seasonal allergies and morbid obesity and there was a note of non-compliant behavior.    She was referred for a sleep study, although it does not appear that she has gone for this.     ED Course: Thought to have CHF, given Lasix 60 mg IV and ASA 324 mg  Review of Systems: As per HPI; otherwise 10 point review of systems reviewed and negative.   Ambulatory Status:  Ambulates without difficulty  Past Medical History:  Diagnosis Date  . Dyspepsia   . Essential hypertension, benign   . Impaired glucose tolerance   . Obesity     Past Surgical History:  Procedure Laterality Date  .  OOPHORECTOMY    . TUBAL LIGATION      Social History   Social History  . Marital status: Married    Spouse name: N/A  . Number of children: N/A  . Years of education: N/A   Occupational History  . [employer]   Social History Main Topics  . Smoking status: Never Smoker  . Smokeless tobacco: Never Used  . Alcohol use No  . Drug use: No  . Sexual activity: Not Currently   Other Topics Concern  . Not on file   Social History Narrative  . No narrative on file    No Known Allergies  Family History  Problem Relation Age of Onset  . Heart disease Mother     Died in her 25s with MI  . Hypertension Mother     Prior to Admission medications   Medication Sig Start Date End Date Taking? Authorizing Provider  azelastine (ASTELIN) 0.1 % nasal spray Place 2 sprays into both nostrils 2 (two) times daily. Use in each nostril as directed 08/27/16  Yes Fayrene Helper, MD  montelukast (SINGULAIR) 10 MG tablet Take 1 tablet (10 mg total) by mouth at bedtime. 08/27/16  Yes Fayrene Helper, MD  triamterene-hydrochlorothiazide (MAXZIDE) 75-50 MG tablet Take 1 tablet by mouth daily. 12/22/16  Yes Fayrene Helper, MD  ranitidine (ZANTAC) 300 MG tablet TAKE 1 TABLET(300 MG) BY MOUTH AT BEDTIME Patient not taking: Reported on 12/17/2016 08/01/16  Fayrene Helper, MD    Physical Exam: Vitals:   01/15/17 1700 01/15/17 1807 01/15/17 1817 01/15/17 2037  BP: (!) 158/97 (!) 167/100 (!) 159/83 (!) 143/81  Pulse: 90 100  84  Resp: (!) 26 (!) 22  (!) 21  Temp:  98 F (36.7 C)  98.8 F (37.1 C)  TempSrc:  Oral  Oral  SpO2: 100% 98%  98%  Weight:  95 kg (209 lb 7 oz)    Height:  5\' 2"  (1.575 m)       General: Appears calm and comfortable and is NAD Eyes:  PERRL, EOMI, normal lids, iris ENT:  grossly normal hearing, lips & tongue, mmm, few teeth in bad shape Neck:  no LAD, masses or thyromegaly Cardiovascular:  RR, mild tachycardia, no m/r/g. No LE edema.    Respiratory:  CTA bilaterally, no w/r/r. Normal respiratory effort.  No tachypnea at the time of my evaluation, on room air Abdomen:  soft, ntnd, NABS Skin:  no rash or induration seen on limited exam Musculoskeletal:  grossly normal tone BUE/BLE, good ROM, no bony abnormality Psychiatric:  grossly normal mood and affect, speech fluent and appropriate, AOx3 Neurologic:  CN 2-12 grossly intact, moves all extremities in coordinated fashion, sensation intact  Labs on Admission: I have personally reviewed following labs and imaging studies  CBC:  Recent Labs Lab 01/15/17 1325  WBC 5.3  NEUTROABS 2.7  HGB 12.4  HCT 38.9  MCV 88.2  PLT 789   Basic Metabolic Panel:  Recent Labs Lab 01/15/17 1325  NA 139  K 3.5  CL 104  CO2 27  GLUCOSE 81  BUN 25*  CREATININE 1.02*  CALCIUM 9.1   GFR: Estimated Creatinine Clearance: 68.6 mL/min (A) (by C-G formula based on SCr of 1.02 mg/dL (H)). Liver Function Tests: No results for input(s): AST, ALT, ALKPHOS, BILITOT, PROT, ALBUMIN in the last 168 hours. No results for input(s): LIPASE, AMYLASE in the last 168 hours. No results for input(s): AMMONIA in the last 168 hours. Coagulation Profile: No results for input(s): INR, PROTIME in the last 168 hours. Cardiac Enzymes:  Recent Labs Lab 01/15/17 1325 01/15/17 1816  TROPONINI 0.03* 0.03*   BNP (last 3 results) No results for input(s): PROBNP in the last 8760 hours. HbA1C: No results for input(s): HGBA1C in the last 72 hours. CBG: No results for input(s): GLUCAP in the last 168 hours. Lipid Profile:  Recent Labs  01/15/17 1816  CHOL 188  HDL 45  LDLCALC 117*  TRIG 130  CHOLHDL 4.2   Thyroid Function Tests:  Recent Labs  01/15/17 1816  TSH 2.699   Anemia Panel: No results for input(s): VITAMINB12, FOLATE, FERRITIN, TIBC, IRON, RETICCTPCT in the last 72 hours. Urine analysis:    Component Value Date/Time   COLORURINE YELLOW 08/05/2016 1414   APPEARANCEUR CLEAR  08/05/2016 1414   LABSPEC 1.020 08/05/2016 1414   PHURINE 6.0 08/05/2016 1414   GLUCOSEU NEGATIVE 08/05/2016 1414   GLUCOSEU NEG mg/dL 12/11/2007 0434   HGBUR TRACE (A) 08/05/2016 1414   HGBUR trace-intact 03/04/2010 0000   BILIRUBINUR NEGATIVE 08/05/2016 1414   BILIRUBINUR neg 04/10/2016 1529   KETONESUR NEGATIVE 08/05/2016 1414   PROTEINUR NEGATIVE 08/05/2016 1414   UROBILINOGEN 1.0 04/10/2016 1529   UROBILINOGEN 1.0 03/04/2010 0000   NITRITE NEGATIVE 08/05/2016 1414   LEUKOCYTESUR TRACE (A) 08/05/2016 1414    Creatinine Clearance: Estimated Creatinine Clearance: 68.6 mL/min (A) (by C-G formula based on SCr of 1.02 mg/dL (H)).  Sepsis Labs: @  LABRCNTIP(procalcitonin:4,lacticidven:4) )No results found for this or any previous visit (from the past 240 hour(s)).   Radiological Exams on Admission: Dg Chest 2 View  Result Date: 01/15/2017 CLINICAL DATA:  Shortness of Breath EXAM: CHEST  2 VIEW COMPARISON:  12/17/2016 FINDINGS: Cardiac shadow is at the upper limits of normal in size but stable. Lungs are well aerated bilaterally. Mild increase in vascular congestion is noted without interstitial edema. No focal infiltrate or effusion is seen. No acute bony abnormality is noted. IMPRESSION: Mild vascular congestion without interstitial edema. Electronically Signed   By: Inez Catalina M.D.   On: 01/15/2017 12:08    EKG: Independently reviewed.  Sinus tachycardia with rate 104; LVH with no evidence of acute ischemia  Assessment/Plan Principal Problem:   CHF exacerbation (HCC) Active Problems:   Morbid obesity   HTN (hypertension), malignant   Chest pain   Prediabetes   Hyperlipidemia LDL goal <100   CHF exacerbation with chest pain -Patient without smoking history or prior h/o respiratory failure presenting with worsening SOB -CXR show vascular congestion without frank edema -Normal WBC count,no infectious symptoms; will not give antibiotics at this time -TSH normal -Elevated  BNP (622, no prior) -With elevated BNP and abnl CXR, new-onset CHF seems most likely diagnosis -Will place in observation status with telemetry -Will request echocardiogram  -She also has chest pressure with marginal troponin and multiple CVD RF including strong FH; will r/o with troponin, repeat EKG in AM, and consult cardiology for possible stress test tomorrow (NPO after midnight) -Will start ASA -Will start Lisinopril 5 mg daily (BP is normal to high and so should support addition of medication - and Maxzide is held for now - see below) -No beta blocker due to possible acute heart failure on presentation -CHF order set utilized; may need CHF team consult but will hold until Echo results are available -Was given Lasix 60 mg x 1 in ER and will repeat with 20 mg BID -[place] O2 for now if needed - no respiratory difficulty at rest at the time of my evaluation  HTN -Previously on Maxzide monotherapy - but HCTZ dose of 50 mg daily increases risk of side effects without improving outcomes -Creatinine is slightly increased today (1.02 vs. 0.75 in 6/17) -Additionally, she likely would benefit from an ACE-I -Will stop Maxzide for now, start Lisinopril, and consider adding a beta blocker pending results of Echo  HLD -Cholesterol: total 188, HDL 45, LDL 117, TG 130 -Start Lipitor 20 mg nightly  Prediabetes -Normal blood sugars -Morbid obesity -No current concerns for DM  DVT prophylaxis:  Lovenox Code Status:  Full - confirmed with patient Family Communication: None present Disposition Plan:  Home once clinically improved Consults called: Cardiology - NPO for possible stress test  Admission status: It is my clinical opinion that referral for OBSERVATION is reasonable and necessary in this patient based on the above information provided. The aforementioned taken together are felt to place the patient at high risk for further clinical deterioration. However it is anticipated that the patient may be  medically stable for discharge from the hospital within 24 to 48 hours.    Karmen Bongo MD [facility]  If 7PM-7AM, please contact night-coverage [email or web address] Password [facility]  01/15/2017, 10:27 PM

## 2017-01-15 NOTE — ED Notes (Signed)
CRITICAL VALUE ALERT  Critical value received:  Troponin 0.03  Date of notification:  3 29 18   Time of notification:  1430  Critical value read back:Yes.    Nurse who received alert:  Laurell Josephs RN  MD notified (1st page):  Wilson Singer  Time of first page:  1430  MD notified (2nd page):  Time of second page:  Responding MD:  Wilson Singer  Time MD responded:  1430

## 2017-01-16 ENCOUNTER — Observation Stay (HOSPITAL_BASED_OUTPATIENT_CLINIC_OR_DEPARTMENT_OTHER): Payer: BLUE CROSS/BLUE SHIELD

## 2017-01-16 ENCOUNTER — Encounter (HOSPITAL_COMMUNITY): Payer: Self-pay | Admitting: Cardiology

## 2017-01-16 DIAGNOSIS — R7303 Prediabetes: Secondary | ICD-10-CM | POA: Diagnosis present

## 2017-01-16 DIAGNOSIS — I509 Heart failure, unspecified: Secondary | ICD-10-CM | POA: Diagnosis not present

## 2017-01-16 DIAGNOSIS — I10 Essential (primary) hypertension: Secondary | ICD-10-CM | POA: Diagnosis not present

## 2017-01-16 DIAGNOSIS — I5043 Acute on chronic combined systolic (congestive) and diastolic (congestive) heart failure: Secondary | ICD-10-CM | POA: Diagnosis not present

## 2017-01-16 DIAGNOSIS — I248 Other forms of acute ischemic heart disease: Secondary | ICD-10-CM | POA: Diagnosis present

## 2017-01-16 DIAGNOSIS — Z8249 Family history of ischemic heart disease and other diseases of the circulatory system: Secondary | ICD-10-CM | POA: Diagnosis not present

## 2017-01-16 DIAGNOSIS — J302 Other seasonal allergic rhinitis: Secondary | ICD-10-CM | POA: Diagnosis present

## 2017-01-16 DIAGNOSIS — I5041 Acute combined systolic (congestive) and diastolic (congestive) heart failure: Secondary | ICD-10-CM

## 2017-01-16 DIAGNOSIS — Z6838 Body mass index (BMI) 38.0-38.9, adult: Secondary | ICD-10-CM | POA: Diagnosis not present

## 2017-01-16 DIAGNOSIS — E785 Hyperlipidemia, unspecified: Secondary | ICD-10-CM | POA: Diagnosis present

## 2017-01-16 DIAGNOSIS — I5023 Acute on chronic systolic (congestive) heart failure: Secondary | ICD-10-CM | POA: Diagnosis not present

## 2017-01-16 DIAGNOSIS — E876 Hypokalemia: Secondary | ICD-10-CM | POA: Diagnosis present

## 2017-01-16 DIAGNOSIS — Z79899 Other long term (current) drug therapy: Secondary | ICD-10-CM | POA: Diagnosis not present

## 2017-01-16 DIAGNOSIS — R7302 Impaired glucose tolerance (oral): Secondary | ICD-10-CM | POA: Diagnosis present

## 2017-01-16 DIAGNOSIS — I110 Hypertensive heart disease with heart failure: Secondary | ICD-10-CM | POA: Diagnosis present

## 2017-01-16 DIAGNOSIS — Z90722 Acquired absence of ovaries, bilateral: Secondary | ICD-10-CM | POA: Diagnosis not present

## 2017-01-16 DIAGNOSIS — Z9119 Patient's noncompliance with other medical treatment and regimen: Secondary | ICD-10-CM | POA: Diagnosis not present

## 2017-01-16 DIAGNOSIS — J069 Acute upper respiratory infection, unspecified: Secondary | ICD-10-CM | POA: Diagnosis present

## 2017-01-16 LAB — TROPONIN I: Troponin I: 0.03 ng/mL (ref <0.03)

## 2017-01-16 LAB — ECHOCARDIOGRAM COMPLETE
Height: 62 in
WEIGHTICAEL: 3320 [oz_av]

## 2017-01-16 LAB — CBC WITH DIFFERENTIAL/PLATELET
Basophils Absolute: 0 10*3/uL (ref 0.0–0.1)
Basophils Relative: 0 %
EOS ABS: 0.2 10*3/uL (ref 0.0–0.7)
Eosinophils Relative: 4 %
HEMATOCRIT: 35.4 % — AB (ref 36.0–46.0)
HEMOGLOBIN: 11.4 g/dL — AB (ref 12.0–15.0)
LYMPHS ABS: 2.2 10*3/uL (ref 0.7–4.0)
Lymphocytes Relative: 39 %
MCH: 28.4 pg (ref 26.0–34.0)
MCHC: 32.2 g/dL (ref 30.0–36.0)
MCV: 88.1 fL (ref 78.0–100.0)
MONO ABS: 0.2 10*3/uL (ref 0.1–1.0)
MONOS PCT: 4 %
NEUTROS PCT: 53 %
Neutro Abs: 2.9 10*3/uL (ref 1.7–7.7)
Platelets: 326 10*3/uL (ref 150–400)
RBC: 4.02 MIL/uL (ref 3.87–5.11)
RDW: 15.1 % (ref 11.5–15.5)
WBC: 5.6 10*3/uL (ref 4.0–10.5)

## 2017-01-16 LAB — BASIC METABOLIC PANEL
ANION GAP: 8 (ref 5–15)
BUN: 21 mg/dL — ABNORMAL HIGH (ref 6–20)
CO2: 30 mmol/L (ref 22–32)
Calcium: 8.6 mg/dL — ABNORMAL LOW (ref 8.9–10.3)
Chloride: 101 mmol/L (ref 101–111)
Creatinine, Ser: 0.83 mg/dL (ref 0.44–1.00)
GFR calc Af Amer: >60 mL/min (ref >60)
Glucose, Bld: 83 mg/dL (ref 65–99)
POTASSIUM: 3.2 mmol/L — AB (ref 3.5–5.1)
SODIUM: 139 mmol/L (ref 135–145)

## 2017-01-16 LAB — MAGNESIUM: MAGNESIUM: 1.7 mg/dL (ref 1.7–2.4)

## 2017-01-16 MED ORDER — PERFLUTREN LIPID MICROSPHERE
1.0000 mL | INTRAVENOUS | Status: AC | PRN
  Administered 2017-01-16: 1 mL via INTRAVENOUS
  Filled 2017-01-16: qty 10

## 2017-01-16 MED ORDER — POTASSIUM CHLORIDE CRYS ER 20 MEQ PO TBCR
40.0000 meq | EXTENDED_RELEASE_TABLET | Freq: Once | ORAL | Status: AC
  Administered 2017-01-16: 40 meq via ORAL
  Filled 2017-01-16: qty 2

## 2017-01-16 MED ORDER — FUROSEMIDE 10 MG/ML IJ SOLN
40.0000 mg | Freq: Two times a day (BID) | INTRAMUSCULAR | Status: DC
  Administered 2017-01-16 – 2017-01-18 (×5): 40 mg via INTRAVENOUS
  Filled 2017-01-16 (×5): qty 4

## 2017-01-16 MED ORDER — MAGNESIUM SULFATE IN D5W 1-5 GM/100ML-% IV SOLN
1.0000 g | Freq: Once | INTRAVENOUS | Status: AC
  Administered 2017-01-16: 1 g via INTRAVENOUS
  Filled 2017-01-16: qty 100

## 2017-01-16 NOTE — Progress Notes (Signed)
[place] with [facility] (Stress Lab) regarding patient reporting that she was told she was to have a cardiac stress test today. Patient has been NPO since 12am. [facility] confirmed that patient was not on schedule for today for a stress test and she verified with Dr.Branch who reported no stress test ordered at this time and placed order for patient to have heart healthy diet. Patient aware of updates.

## 2017-01-16 NOTE — Progress Notes (Signed)
PROGRESS NOTE                                                                                                                                                                                                             Patient Demographics:    Michelle David, is a 56 y.o. female, DOB - [birth year], YOV:[id number]  Admit date - 01/15/2017   Admitting Physician Karmen Bongo, MD  Outpatient Primary MD for the patient is Tula Nakayama, MD  LOS - 0  Outpatient Specialists:NONE  Chief Complaint  Patient presents with  . Shortness of Breath       Brief Narrative   56 year old obese female with history of hypertension presented with progressive shortness of breath for almost 2 months. Patient now has dyspnea on minimal exertion , 3 pillow orthopnea and PND. Reports nonproductive cough and some nausea. Had some funny pressure-like feeling in the chest, nonradiating, no palpitations, headache or blurred vision. Denied vomiting, fevers, chills, abdominal pain, bowel or urinary symptoms. No sick contact or recent travel. Since her PCP 3 weeks prior to admission and was referred to the sleep study. She came to the ED where she was found to have pulmonary vascular congestion on chest x-ray, elevated BNP. She was given IV Lasix and admitted to hospitalist service.   Subjective:   Patient still has dyspnea on minimal exertion but better since admission. Denied further chest pain symptoms.   Assessment  & Plan :    Principal Problem:   Acute  systolic and diastolic congestive heart failure (HCC) Continue telemetry monitoring. 2-D echo done with severely reduced EF of 30-35% with diffuse hypokinesis. Also has grade 2 diastolic dysfunction, mild AR, MR .Placed on IV Lasix 40 mg twice a day. Monitor strict I/O and daily weight. -Added aspirin and lisinopril. -Cardiology consult appreciated. -We will possibly need stress test/LHC as  outpatient. - add beta blocker once symptoms better and ready for discharge. -Lipid panel shows LDL of 117.  add statin. TSH normal.  Active Problems: Uncontrolled hypertension Blood pressure medications adjusted. Continue to monitor. Counseled on diet adherence.    Chest pain Appears to be demand ischemia with CHF. No EKG changes. Mildly elevated troponin which has resolved. Possible workup as outpatient.    Prediabetes Check A1c.    Hyperlipidemia LDL goal <100 Added statin.  Hypokalemia/hypomagnesemia Replenish.  Code Status : Full code  Family Communication  : None at bedside  Disposition Plan  : Home possibly in the next 24-48 hours if improved  Barriers For Discharge : Active symptoms  Consults  :  Cardiology  Procedures  : 2-D echo  DVT Prophylaxis  :  Lovenox -  Lab Results  Component Value Date   PLT 326 01/15/2017    Antibiotics  :    Anti-infectives    None        Objective:   Vitals:   01/15/17 1817 01/15/17 2037 01/16/17 0500 01/16/17 0610  BP: (!) 159/83 (!) 143/81  122/65  Pulse:  84  82  Resp:  (!) 21  20  Temp:  98.8 F (37.1 C)  97.8 F (36.6 C)  TempSrc:  Oral  Oral  SpO2:  98%  100%  Weight:   94.1 kg (207 lb 8 oz)   Height:        Wt Readings from Last 3 Encounters:  01/16/17 94.1 kg (207 lb 8 oz)  12/17/16 97.6 kg (215 lb 1.9 oz)  08/27/16 100.7 kg (222 lb)     Intake/Output Summary (Last 24 hours) at 01/16/17 1140 Last data filed at 01/16/17 0900  Gross per 24 hour  Intake              240 ml  Output              900 ml  Net             -660 ml     Physical Exam  Gen: not in distress HEENT: moist mucosa, supple neck Chest: Scattered rhonchi over right lung base CVS: N S1&S2, no murmurs, rubs or gallop GI: soft, NT, ND,  Musculoskeletal: warm, trace edema     Data Review:    CBC  Recent Labs Lab 01/15/17 1325 01/15/17 2358  WBC 5.3 5.6  HGB 12.4 11.4*  HCT 38.9 35.4*  PLT 399 326  MCV 88.2  88.1  MCH 28.1 28.4  MCHC 31.9 32.2  RDW 15.1 15.1  LYMPHSABS 2.2 2.2  MONOABS 0.3 0.2  EOSABS 0.1 0.2  BASOSABS 0.0 0.0    Chemistries   Recent Labs Lab 01/15/17 1325 01/16/17 0632  NA 139 139  K 3.5 3.2*  CL 104 101  CO2 27 30  GLUCOSE 81 83  BUN 25* 21*  CREATININE 1.02* 0.83  CALCIUM 9.1 8.6*  MG  --  1.7   ------------------------------------------------------------------------------------------------------------------  Recent Labs  01/15/17 1816  CHOL 188  HDL 45  LDLCALC 117*  TRIG 130  CHOLHDL 4.2    Lab Results  Component Value Date   HGBA1C 5.4 03/01/2015   ------------------------------------------------------------------------------------------------------------------  Recent Labs  01/15/17 1816  TSH 2.699   ------------------------------------------------------------------------------------------------------------------ No results for input(s): VITAMINB12, FOLATE, FERRITIN, TIBC, IRON, RETICCTPCT in the last 72 hours.  Coagulation profile No results for input(s): INR, PROTIME in the last 168 hours.  No results for input(s): DDIMER in the last 72 hours.  Cardiac Enzymes  Recent Labs Lab 01/15/17 1816 01/15/17 2358 01/16/17 0632  TROPONINI 0.03* 0.03* <0.03   ------------------------------------------------------------------------------------------------------------------    Component Value Date/Time   BNP 622.0 (H) 01/15/2017 1325    Inpatient Medications  Scheduled Meds: . aspirin EC  81 mg Oral Daily  . atorvastatin  20 mg Oral q1800  . azelastine  2 spray Each Nare BID  . enoxaparin (LOVENOX) injection  40 mg Subcutaneous Q24H  . furosemide  40  mg Intravenous BID  . lisinopril  5 mg Oral Daily  . montelukast  10 mg Oral QHS  . potassium chloride  40 mEq Oral Once  . sodium chloride flush  3 mL Intravenous Q12H   Continuous Infusions: PRN Meds:.sodium chloride, acetaminophen, ondansetron (ZOFRAN) IV, sodium chloride  flush  Micro Results No results found for this or any previous visit (from the past 240 hour(s)).  Radiology Reports Dg Chest 2 View  Result Date: 01/15/2017 CLINICAL DATA:  Shortness of Breath EXAM: CHEST  2 VIEW COMPARISON:  12/17/2016 FINDINGS: Cardiac shadow is at the upper limits of normal in size but stable. Lungs are well aerated bilaterally. Mild increase in vascular congestion is noted without interstitial edema. No focal infiltrate or effusion is seen. No acute bony abnormality is noted. IMPRESSION: Mild vascular congestion without interstitial edema. Electronically Signed   By: Inez Catalina M.D.   On: 01/15/2017 12:08   Dg Chest 2 View  Result Date: 12/17/2016 CLINICAL DATA:  Intermittent productive cough and shortness of breath for several months associated with chest tightness. Non smoker. History of hypertension EXAM: CHEST  2 VIEW COMPARISON:  Chest x-ray of December 11, 2003 and report of a chest x-ray dated August 25, 2011 FINDINGS: The lungs are well-expanded. The interstitial markings are mildly prominent. There is no alveolar infiltrate or pleural effusion. The heart is top-normal in size. The pulmonary vascularity is not engorged. The mediastinum is normal in width. There is multilevel degenerative disc disease of the thoracic spine. IMPRESSION: There mild bronchitic changes which may be acute or chronic. There is no pneumonia, CHF, nor other acute cardiopulmonary abnormality. Electronically Signed   By: David  Martinique M.D.   On: 12/17/2016 13:07    Time Spent in minutes  25   Louellen Molder M.D on 01/16/2017 at 11:40 AM  Between 7am to 7pm - Pager - [phone]  After 7pm go to [email or web address] - password [facility]  [facility] -  Office  [phone]

## 2017-01-16 NOTE — Consult Note (Signed)
Reason for Consult:   Acute CHF  Requesting Physician: [facility] Primary Cardiologist Dr Domenic Polite  HPI:   Michelle David 56 y/o AA female, married, lives with her husband, daughter, and two grandchildren. 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 She is followed by Dr Moshe Cipro for HTN. She saw Dr Domenic Polite in 2012 for chest pain. Myoview done July 2012 was low risk, echo done Aug 2012 showed an EF of 55% with LVH.   The pt developed a cough about a month ago. She was initially diagnosed with bronchitis but her SOB became worse over yesterday while at work and her supervisor sent her to the ED. In the ED her BNP was 660 and her CXR suggested CHF. Her B/P was also elevated- 161/115. She was admitted and started on IV Lasix with a 600 cc diuresis and some improvement in her symptoms, though she says she is still "not right".   The pt says she has had DOE for about a month. In the past few days she has noted orthopnea as well. She reports compliance with her medications but says she didn't taker her B/P medicine on the day she was admitted. She denies any chest pain or tightness. She told me her mother died in her 19's from a heart attack.   PMHx:  Past Medical History:  Diagnosis Date  . Dyslipidemia   . Dyspepsia   . Hypertensive cardiovascular disease    LVH by echo, CHF March 2018  . Impaired glucose tolerance   . Obesity     Past Surgical History:  Procedure Laterality Date  . OOPHORECTOMY    . TUBAL LIGATION      SOCHx:  reports that she has never smoked. She has never used smokeless tobacco. She reports that she does not drink alcohol or use drugs.  FAMHx: Family History  Problem Relation Age of Onset  . Heart disease Mother     Died in her 59s with MI  . Hypertension Mother     ALLERGIES: No Known Allergies  ROS: Review of Systems: General: negative for chills, fever, night sweats or weight changes.  Cardiovascular: negative for  palpitations HEENT: negative for any visual disturbances, blindness, glaucoma, poor dentition Dermatological: negative for rash Respiratory: negative for cough, hemoptysis, or wheezing Urologic: negative for hematuria or dysuria Abdominal: negative for nausea, vomiting, diarrhea, bright red blood per rectum, melena, or hematemesis Neurologic: negative for visual changes, syncope, or dizziness Musculoskeletal: negative for back pain, joint pain, or swelling Psych: cooperative and appropriate All other systems reviewed and are otherwise negative except as noted above.   HOME MEDICATIONS: Prior to Admission medications   Medication Sig Start Date End Date Taking? Authorizing Provider  azelastine (ASTELIN) 0.1 % nasal spray Place 2 sprays into both nostrils 2 (two) times daily. Use in each nostril as directed 08/27/16  Yes Fayrene Helper, MD  montelukast (SINGULAIR) 10 MG tablet Take 1 tablet (10 mg total) by mouth at bedtime. 08/27/16  Yes Fayrene Helper, MD  triamterene-hydrochlorothiazide (MAXZIDE) 75-50 MG tablet Take 1 tablet by mouth daily. 12/22/16  Yes Fayrene Helper, MD    HOSPITAL MEDICATIONS: I have reviewed the patient's current medications.  VITALS: Blood pressure 122/65, pulse 82, temperature 97.8 F (36.6 C), temperature source Oral, resp. rate 20, height 5\' 2"  (1.575 m), weight 207 lb 8 oz (94.1 kg), SpO2 100 %.  PHYSICAL EXAM: General appearance: alert, cooperative, no distress, morbidly obese  and poor dentition Neck: no carotid bruit and JVD noted Lungs: clear to auscultation bilaterally Heart: regular rate and rhythm and short mid systolic murmur LSB Abdomen: morbid obesity, no scars Extremities: extremities normal, atraumatic, no cyanosis or edema Pulses: radila pulses are faint bilateraly Skin: Skin color, texture, turgor normal. No rashes or lesions Neurologic: Grossly normal  LABS: Results for orders placed or performed during the hospital encounter  of 01/15/17 (from the past 24 hour(s))  CBC with Differential     Status: None   Collection Time: 01/15/17  1:25 PM  Result Value Ref Range   WBC 5.3 4.0 - 10.5 K/uL   RBC 4.41 3.87 - 5.11 MIL/uL   Hemoglobin 12.4 12.0 - 15.0 g/dL   HCT 38.9 36.0 - 46.0 %   MCV 88.2 78.0 - 100.0 fL   MCH 28.1 26.0 - 34.0 pg   MCHC 31.9 30.0 - 36.0 g/dL   RDW 15.1 11.5 - 15.5 %   Platelets 399 150 - 400 K/uL   Neutrophils Relative % 51 %   Neutro Abs 2.7 1.7 - 7.7 K/uL   Lymphocytes Relative 42 %   Lymphs Abs 2.2 0.7 - 4.0 K/uL   Monocytes Relative 5 %   Monocytes Absolute 0.3 0.1 - 1.0 K/uL   Eosinophils Relative 2 %   Eosinophils Absolute 0.1 0.0 - 0.7 K/uL   Basophils Relative 0 %   Basophils Absolute 0.0 0.0 - 0.1 K/uL  Basic metabolic panel     Status: Abnormal   Collection Time: 01/15/17  1:25 PM  Result Value Ref Range   Sodium 139 135 - 145 mmol/L   Potassium 3.5 3.5 - 5.1 mmol/L   Chloride 104 101 - 111 mmol/L   CO2 27 22 - 32 mmol/L   Glucose, Bld 81 65 - 99 mg/dL   BUN 25 (H) 6 - 20 mg/dL   Creatinine, Ser 1.02 (H) 0.44 - 1.00 mg/dL   Calcium 9.1 8.9 - 10.3 mg/dL   GFR calc non Af Amer >60 >60 mL/min   GFR calc Af Amer >60 >60 mL/min   Anion gap 8 5 - 15  Brain natriuretic peptide     Status: Abnormal   Collection Time: 01/15/17  1:25 PM  Result Value Ref Range   B Natriuretic Peptide 622.0 (H) 0.0 - 100.0 pg/mL  Troponin I     Status: Abnormal   Collection Time: 01/15/17  1:25 PM  Result Value Ref Range   Troponin I 0.03 (HH) <0.03 ng/mL  Urine rapid drug screen (hosp performed)     Status: None   Collection Time: 01/15/17  6:07 PM  Result Value Ref Range   Opiates NONE DETECTED NONE DETECTED   Cocaine NONE DETECTED NONE DETECTED   Benzodiazepines NONE DETECTED NONE DETECTED   Amphetamines NONE DETECTED NONE DETECTED   Tetrahydrocannabinol NONE DETECTED NONE DETECTED   Barbiturates NONE DETECTED NONE DETECTED  Troponin I     Status: Abnormal   Collection Time:  01/15/17  6:16 PM  Result Value Ref Range   Troponin I 0.03 (HH) <0.03 ng/mL  TSH     Status: None   Collection Time: 01/15/17  6:16 PM  Result Value Ref Range   TSH 2.699 0.350 - 4.500 uIU/mL  Lipid panel     Status: Abnormal   Collection Time: 01/15/17  6:16 PM  Result Value Ref Range   Cholesterol 188 0 - 200 mg/dL   Triglycerides 130 <150 mg/dL   HDL 45 >40  mg/dL   Total CHOL/HDL Ratio 4.2 RATIO   VLDL 26 0 - 40 mg/dL   LDL Cholesterol 117 (H) 0 - 99 mg/dL  CBC WITH DIFFERENTIAL     Status: Abnormal   Collection Time: 01/15/17 11:58 PM  Result Value Ref Range   WBC 5.6 4.0 - 10.5 K/uL   RBC 4.02 3.87 - 5.11 MIL/uL   Hemoglobin 11.4 (L) 12.0 - 15.0 g/dL   HCT 35.4 (L) 36.0 - 46.0 %   MCV 88.1 78.0 - 100.0 fL   MCH 28.4 26.0 - 34.0 pg   MCHC 32.2 30.0 - 36.0 g/dL   RDW 15.1 11.5 - 15.5 %   Platelets 326 150 - 400 K/uL   Neutrophils Relative % 53 %   Neutro Abs 2.9 1.7 - 7.7 K/uL   Lymphocytes Relative 39 %   Lymphs Abs 2.2 0.7 - 4.0 K/uL   Monocytes Relative 4 %   Monocytes Absolute 0.2 0.1 - 1.0 K/uL   Eosinophils Relative 4 %   Eosinophils Absolute 0.2 0.0 - 0.7 K/uL   Basophils Relative 0 %   Basophils Absolute 0.0 0.0 - 0.1 K/uL  Troponin I     Status: Abnormal   Collection Time: 01/15/17 11:58 PM  Result Value Ref Range   Troponin I 0.03 (HH) <0.03 ng/mL  Troponin I     Status: None   Collection Time: 01/16/17  6:32 AM  Result Value Ref Range   Troponin I <0.03 <0.03 ng/mL  Basic metabolic panel     Status: Abnormal   Collection Time: 01/16/17  6:32 AM  Result Value Ref Range   Sodium 139 135 - 145 mmol/L   Potassium 3.2 (L) 3.5 - 5.1 mmol/L   Chloride 101 101 - 111 mmol/L   CO2 30 22 - 32 mmol/L   Glucose, Bld 83 65 - 99 mg/dL   BUN 21 (H) 6 - 20 mg/dL   Creatinine, Ser 0.83 0.44 - 1.00 mg/dL   Calcium 8.6 (L) 8.9 - 10.3 mg/dL   GFR calc non Af Amer >60 >60 mL/min   GFR calc Af Amer >60 >60 mL/min   Anion gap 8 5 - 15  Magnesium     Status: None    Collection Time: 01/16/17  6:32 AM  Result Value Ref Range   Magnesium 1.7 1.7 - 2.4 mg/dL    EKG: NSR, LVH, LAD  Telemetry- NSR, PVCs, couplets  IMAGING: Dg Chest 2 View  Result Date: 01/15/2017 CLINICAL DATA:  Shortness of Breath EXAM: CHEST  2 VIEW COMPARISON:  12/17/2016 FINDINGS: Cardiac shadow is at the upper limits of normal in size but stable. Lungs are well aerated bilaterally. Mild increase in vascular congestion is noted without interstitial edema. No focal infiltrate or effusion is seen. No acute bony abnormality is noted. IMPRESSION: Mild vascular congestion without interstitial edema. Electronically Signed   By: Inez Catalina M.D.   On: 01/15/2017 12:08    IMPRESSION:  1.- Acute CHF-etiology not yet determined but suspect this is acute diastolic CHF  2.- HTN/HCVD_LVH on EKG and prior echo. Her B/P was uncontrolled on adm and OP medication compliance is in question.  3.- Obesity- BMI 38  4.- FMhx of CAD- No symptoms of angina and Troponin negative x 3. She may need an OP Myoview later.    RECOMMENDATION: MD to see. K+ replacement ordered. Check echo.  ACE, ASA 81 mg, and low dose statin ordered, consider addition of low dose Coreg as well (I  did not order).   Time Spent Directly with Patient: [address] minutes  Kerin Ransom, [place] beeper 01/16/2017, 8:38 AM   Attending note Patient seen and discussed with PA Rosalyn Gess, I agree with his documentation above. 56 yo female history of HTN, morbid obesity, admitted with SOB and orthopnea. She reports gradual onset of symptoms over the last several weeks. Subjective adbominal distension, weight gain.    Hgb 12.4 Plt 326 K 3.5 Cr 1.02 BNP 622 TSH 2.7  Trop 0.03--> 0.03-->0.03--> CXR mild pulm edema EKG: SR, LVH  Patient presents with acute clinical heart failure, echo is pending to better describe etiology. She received lasix IV 20mg  and 60mg  yesterday, negative 647mL. Down trend in Cr with diuresis consistent with  venous congestion and CHF. Continue diuresis, start IV lasix 40mg  bid. Further medical therapy pending echo results. Mild flat troponin just above level of detection, not consistent with ACS, likely related to her CHF.    Carlyle Dolly MD

## 2017-01-16 NOTE — Care Management Note (Signed)
Case Management Note  Patient Details  Name: Michelle David MRN: [id number] Date of Birth: 01/08/1961  Subjective/Objective:                  Pt admitted with CHF. This is new dx. She is from home, ind with ADL's, She works. She has PCP. She has insurance with drug coverage. She does not have scale but will buy one after DC. No needs communicated.  Action/Plan: Pt plans to return home with self care. She will f/u with cardiology for ongoing education.   Expected Discharge Date:  01/17/17               Expected Discharge Plan:  Home/Self Care  In-House Referral:  NA  Discharge planning Services  CM Consult  Post Acute Care Choice:  NA Choice offered to:  NA  Status of Service:  Completed, signed off  Sherald Barge, RN 01/16/2017, 1:09 PM

## 2017-01-16 NOTE — Progress Notes (Signed)
*  PRELIMINARY RESULTS* Echocardiogram 2D Echocardiogram with definity has been performed.  Leavy Cella 01/16/2017, 9:54 AM

## 2017-01-17 DIAGNOSIS — I5041 Acute combined systolic (congestive) and diastolic (congestive) heart failure: Secondary | ICD-10-CM | POA: Diagnosis present

## 2017-01-17 LAB — BASIC METABOLIC PANEL
ANION GAP: 8 (ref 5–15)
BUN: 23 mg/dL — ABNORMAL HIGH (ref 6–20)
CO2: 29 mmol/L (ref 22–32)
Calcium: 9.3 mg/dL (ref 8.9–10.3)
Chloride: 105 mmol/L (ref 101–111)
Creatinine, Ser: 0.94 mg/dL (ref 0.44–1.00)
GLUCOSE: 88 mg/dL (ref 65–99)
POTASSIUM: 3.8 mmol/L (ref 3.5–5.1)
SODIUM: 142 mmol/L (ref 135–145)

## 2017-01-17 MED ORDER — LISINOPRIL 10 MG PO TABS
10.0000 mg | ORAL_TABLET | Freq: Every day | ORAL | Status: DC
  Administered 2017-01-18 – 2017-01-20 (×3): 10 mg via ORAL
  Filled 2017-01-17 (×3): qty 1

## 2017-01-17 MED ORDER — ATORVASTATIN CALCIUM 40 MG PO TABS
40.0000 mg | ORAL_TABLET | Freq: Every day | ORAL | Status: DC
  Administered 2017-01-17 – 2017-01-19 (×3): 40 mg via ORAL
  Filled 2017-01-17 (×3): qty 1

## 2017-01-17 NOTE — Progress Notes (Signed)
PROGRESS NOTE                                                                                                                                                                                                             Patient Demographics:    Michelle David, is a 57 y.o. female, DOB - 1960/12/09, ION:[id number]  Admit date - 01/15/2017   Admitting Physician Karmen Bongo, MD  Outpatient Primary MD for the patient is Tula Nakayama, MD  LOS - 1  Outpatient Specialists:NONE  Chief Complaint  Patient presents with  . Shortness of Breath       Brief Narrative   56 year old obese female with history of hypertension presented with progressive shortness of breath for almost 2 months. Patient  with dyspnea on minimal exertion , 3 pillow orthopnea and PND. Reports nonproductive cough and some nausea. Had some funny pressure-like feeling in the chest, nonradiating, no palpitations, headache or blurred vision. Denied vomiting, fevers, chills, abdominal pain, bowel or urinary symptoms. No sick contact or recent travel. She came to the ED where she was found to have pulmonary vascular congestion on chest x-ray, elevated BNP. She was given IV Lasix and admitted to hospitalist service.   Subjective:   Significant improvement in symptoms- dyspnea with exertion,  But still not able to lie flat. She admits to intermittent chest pain, but none recently. Mother died of cardiac disease in her 40s,  Has aunts with CHF.    Assessment  & Plan :    Principal Problem:   Acute  systolic and diastolic congestive heart failure ([place])- 2-D echo done with severely reduced EF of 30-35% with diffuse hypokinesis. Also has grade 2 diastolic dysfunction, mild AR, MR .Will likely need cath As Inpatient. - IV Lasix 40 mg twice a day Placed on IV Lasix 40 mg twice a day.  - Appears urine output not appropriately recorded, Weights downtrending, does not  know her baseline weight  -Cont aspirin, increase lisinopril  - Cardiology consult appreciated -We will possibly need stress test/LHC as outpatient. - add beta blocker once symptoms better and ready for discharge. -Lipid panel shows LDL of 117.  Cont statin, TSH normal.  Active Problems: Uncontrolled hypertension Increase lisinopril from 5 to 10 mg daily    Chest pain Appears to be demand ischemia with CHF. No EKG changes. Mildly  elevated troponin which has resolved.    Prediabetes- Hx. blood sugars have been stable here, hence will not check A1c, follow-up as outpatient for that    Hyperlipidemia LDL goal <100 - with low EF possible coronary artery disease, will increase Lipitor to 40 daily  Hypokalemia/hypomagnesemia Replete.  Code Status : Full code  Family Communication  : None at bedside  Disposition Plan  : Home possibly, following diuresis and cardiology evaluation and recommendations for systolic dysfunction, ?CATH.  Barriers For Discharge : Active symptoms  Consults  :  Cardiology  Procedures  : 2-D echo  DVT Prophylaxis  :  Lovenox -  Lab Results  Component Value Date   PLT 326 01/15/2017    Antibiotics  :  NOne   Anti-infectives    None        Objective:   Vitals:   01/16/17 0610 01/16/17 2100 01/17/17 0500 01/17/17 1303  BP: 122/65 130/65 (!) 143/85 134/67  Pulse: 82 94 83 84  Resp: 20 20 20 20   Temp: 97.8 F (36.6 C) 97.8 F (36.6 C) 97.5 F (36.4 C) 97.8 F (36.6 C)  TempSrc: Oral Oral Oral Oral  SpO2: 100% 99% 99% 96%  Weight:   93.6 kg (206 lb 5.6 oz)   Height:        Wt Readings from Last 3 Encounters:  01/17/17 93.6 kg (206 lb 5.6 oz)  12/17/16 97.6 kg (215 lb 1.9 oz)  08/27/16 100.7 kg (222 lb)     Intake/Output Summary (Last 24 hours) at 01/17/17 1548 Last data filed at 01/17/17 1200  Gross per 24 hour  Intake              480 ml  Output                0 ml  Net              480 ml    Physical Exam  Gen: not in  distress, sitting on side of bed, alert and oriented HEENT: moist mucosa, supple neck Chest: No crackles appreciated CVS: N S1&S2, no murmurs, rubs or gallop GI: soft, NT, ND,  Musculoskeletal: warm, trace edema   Data Review:    CBC  Recent Labs Lab 01/15/17 1325 01/15/17 2358  WBC 5.3 5.6  HGB 12.4 11.4*  HCT 38.9 35.4*  PLT 399 326  MCV 88.2 88.1  MCH 28.1 28.4  MCHC 31.9 32.2  RDW 15.1 15.1  LYMPHSABS 2.2 2.2  MONOABS 0.3 0.2  EOSABS 0.1 0.2  BASOSABS 0.0 0.0    Chemistries   Recent Labs Lab 01/15/17 1325 01/16/17 0632 01/17/17 0639  NA 139 139 142  K 3.5 3.2* 3.8  CL 104 101 105  CO2 27 30 29   GLUCOSE 81 83 88  BUN 25* 21* 23*  CREATININE 1.02* 0.83 0.94  CALCIUM 9.1 8.6* 9.3  MG  --  1.7  --    ------------------------------------------------------------------------------------------------------------------  Recent Labs  01/15/17 1816  CHOL 188  HDL 45  LDLCALC 117*  TRIG 130  CHOLHDL 4.2    Lab Results  Component Value Date   HGBA1C 5.4 03/01/2015   ------------------------------------------------------------------------------------------------------------------  Recent Labs  01/15/17 1816  TSH 2.699   Cardiac Enzymes  Recent Labs Lab 01/15/17 1816 01/15/17 2358 01/16/17 0632  TROPONINI 0.03* 0.03* <0.03   ------------------------------------------------------------------------------------------------------------------    Component Value Date/Time   BNP 622.0 (H) 01/15/2017 1325    Inpatient Medications  Scheduled Meds: . aspirin EC  81 mg Oral Daily  . atorvastatin  20 mg Oral q1800  . azelastine  2 spray Each Nare BID  . enoxaparin (LOVENOX) injection  40 mg Subcutaneous Q24H  . furosemide  40 mg Intravenous BID  . lisinopril  5 mg Oral Daily  . montelukast  10 mg Oral QHS  . sodium chloride flush  3 mL Intravenous Q12H   Continuous Infusions: PRN Meds:.sodium chloride, acetaminophen, ondansetron (ZOFRAN) IV,  sodium chloride flush  Micro Results No results found for this or any previous visit (from the past 240 hour(s)).  Radiology Reports Dg Chest 2 View  Result Date: 01/15/2017 CLINICAL DATA:  Shortness of Breath EXAM: CHEST  2 VIEW COMPARISON:  12/17/2016 FINDINGS: Cardiac shadow is at the upper limits of normal in size but stable. Lungs are well aerated bilaterally. Mild increase in vascular congestion is noted without interstitial edema. No focal infiltrate or effusion is seen. No acute bony abnormality is noted. IMPRESSION: Mild vascular congestion without interstitial edema. Electronically Signed   By: Inez Catalina M.D.   On: 01/15/2017 12:08   Time Spent in minutes  25  Bethena Roys M.D on 01/17/2017 at 3:48 PM  Between 7am to 7pm - Pager - [phone]  After 7pm go to [email or web address] - password [facility]  [facility] -  Office  [phone]

## 2017-01-18 DIAGNOSIS — I5043 Acute on chronic combined systolic (congestive) and diastolic (congestive) heart failure: Secondary | ICD-10-CM

## 2017-01-18 LAB — CBC
HCT: 42.5 % (ref 36.0–46.0)
Hemoglobin: 13.8 g/dL (ref 12.0–15.0)
MCH: 28.8 pg (ref 26.0–34.0)
MCHC: 32.5 g/dL (ref 30.0–36.0)
MCV: 88.7 fL (ref 78.0–100.0)
PLATELETS: 399 10*3/uL (ref 150–400)
RBC: 4.79 MIL/uL (ref 3.87–5.11)
RDW: 15.2 % (ref 11.5–15.5)
WBC: 5.2 10*3/uL (ref 4.0–10.5)

## 2017-01-18 LAB — BASIC METABOLIC PANEL
Anion gap: 7 (ref 5–15)
BUN: 27 mg/dL — ABNORMAL HIGH (ref 6–20)
CALCIUM: 9.2 mg/dL (ref 8.9–10.3)
CO2: 31 mmol/L (ref 22–32)
CREATININE: 1.04 mg/dL — AB (ref 0.44–1.00)
Chloride: 100 mmol/L — ABNORMAL LOW (ref 101–111)
GFR calc non Af Amer: >60 mL/min (ref >60)
Glucose, Bld: 87 mg/dL (ref 65–99)
Potassium: 3.6 mmol/L (ref 3.5–5.1)
SODIUM: 138 mmol/L (ref 135–145)

## 2017-01-18 LAB — MAGNESIUM: MAGNESIUM: 2.1 mg/dL (ref 1.7–2.4)

## 2017-01-18 MED ORDER — FUROSEMIDE 10 MG/ML IJ SOLN
60.0000 mg | Freq: Two times a day (BID) | INTRAMUSCULAR | Status: DC
Start: 2017-01-18 — End: 2017-01-19
  Administered 2017-01-18: 60 mg via INTRAVENOUS
  Filled 2017-01-18: qty 6

## 2017-01-18 NOTE — Progress Notes (Signed)
PROGRESS NOTE                                                                                                                                                                                                             Patient Demographics:    Michelle David, is a 56 y.o. female, DOB - [birth year], YWV:[id number]  Admit date - 01/15/2017   Admitting Physician Karmen Bongo, MD  Outpatient Primary MD for the patient is Tula Nakayama, MD  LOS - 2  Outpatient Specialists:NONE  Chief Complaint  Patient presents with  . Shortness of Breath       Brief Narrative   56 year old obese female with history of hypertension presented with progressive shortness of breath for almost 2 months. Patient  with dyspnea on minimal exertion , 3 pillow orthopnea and PND. Reports nonproductive cough and some nausea. Had some funny pressure-like feeling in the chest, nonradiating, no palpitations, headache or blurred vision. Denied vomiting, fevers, chills, abdominal pain, bowel or urinary symptoms. No sick contact or recent travel. She came to the ED where she was found to have pulmonary vascular congestion on chest x-ray, elevated BNP. She was given IV Lasix and admitted to hospitalist service.   Subjective:   Significant improvement in symptoms- Improved dyspnea with exertion .   Assessment  & Plan :    Principal Problem:   Acute  systolic and diastolic congestive heart failure ([place])- 2-D echo done with severely reduced EF of 30-35% with diffuse hypokinesis. Also has grade 2 diastolic dysfunction, mild AR, MR .Will likely need cath As Inpatient. - increase IV Lasix to 60 mg twice a day, considering with unchanged. - Appears urine output not appropriately recorded, Weights now stable does not know her baseline weight  -Cont aspirin, increase lisinopril, now 10mg  daily, - Cardiology consult appreciated, awaiting recommendations on  possible Cath while inpt, with reduced EF? - add beta blocker once symptoms better and ready for discharge. -Lipid panel shows LDL of 117.  Cont statin, TSH normal. - Cr stable- 1.04  Active Problems: Uncontrolled hypertension Cont lisinopril 5 to 10 mg daily    Chest pain Appears to be demand ischemia with CHF. No EKG changes. Mildly elevated troponin which has resolved.    Prediabetes- Hx. blood sugars actually lo normal here. Will check hgba1c considering CHF.    Hyperlipidemia LDL  goal <100 - with low EF possible coronary artery disease, will increase Lipitor to 40 daily  Hypokalemia/hypomagnesemia Replete.  Code Status : Full code  Family Communication  : None at bedside  Disposition Plan  : Home possibly, following diuresis and cardiology evaluation and recommendations for systolic dysfunction, ?CATH.  Barriers For Discharge : Active symptoms  Consults  :  Cardiology  Procedures  : 2-D echo  DVT Prophylaxis  :  Lovenox -  Lab Results  Component Value Date   PLT 399 01/18/2017    Antibiotics  :  NOne   Anti-infectives    None        Objective:   Vitals:   01/17/17 1303 01/17/17 2100 01/18/17 0520 01/18/17 1305  BP: 134/67 (!) 144/84 124/82 (!) 113/54  Pulse: 84 92 81 86  Resp: 20 20 20 20   Temp: 97.8 F (36.6 C) 97.7 F (36.5 C) 98 F (36.7 C) 97.8 F (36.6 C)  TempSrc: Oral Oral Oral Oral  SpO2: 96% 96% 96% 97%  Weight:   93.8 kg (206 lb 12.7 oz)   Height:        Wt Readings from Last 3 Encounters:  01/18/17 93.8 kg (206 lb 12.7 oz)  12/17/16 97.6 kg (215 lb 1.9 oz)  08/27/16 100.7 kg (222 lb)     Intake/Output Summary (Last 24 hours) at 01/18/17 1922 Last data filed at 01/18/17 1800  Gross per 24 hour  Intake              720 ml  Output                0 ml  Net              720 ml    Physical Exam  Gen: not in distress, sitting on side of bed, alert and oriented, HEENT: moist mucosa, supple neck Chest: No crackles appreciated,  no wheeze CVS: regular T2&W5,  2/6 systolic murmur Vs soft S3. GI: soft, NT, ND,  Musculoskeletal: warm,still with  trace edema   Data Review:    CBC  Recent Labs Lab 01/15/17 1325 01/15/17 2358 01/18/17 0713  WBC 5.3 5.6 5.2  HGB 12.4 11.4* 13.8  HCT 38.9 35.4* 42.5  PLT 399 326 399  MCV 88.2 88.1 88.7  MCH 28.1 28.4 28.8  MCHC 31.9 32.2 32.5  RDW 15.1 15.1 15.2  LYMPHSABS 2.2 2.2  --   MONOABS 0.3 0.2  --   EOSABS 0.1 0.2  --   BASOSABS 0.0 0.0  --     Chemistries   Recent Labs Lab 01/15/17 1325 01/16/17 0632 01/17/17 0639 01/18/17 0713  NA 139 139 142 138  K 3.5 3.2* 3.8 3.6  CL 104 101 105 100*  CO2 27 30 29 31   GLUCOSE 81 83 88 87  BUN 25* 21* 23* 27*  CREATININE 1.02* 0.83 0.94 1.04*  CALCIUM 9.1 8.6* 9.3 9.2  MG  --  1.7  --  2.1   Cardiac Enzymes  Recent Labs Lab 01/15/17 1816 01/15/17 2358 01/16/17 0632  TROPONINI 0.03* 0.03* <0.03   ------------------------------------------------------------------------------------------------------------------    Component Value Date/Time   BNP 622.0 (H) 01/15/2017 1325   Inpatient Medications  Scheduled Meds: . aspirin EC  81 mg Oral Daily  . atorvastatin  40 mg Oral q1800  . azelastine  2 spray Each Nare BID  . enoxaparin (LOVENOX) injection  40 mg Subcutaneous Q24H  . furosemide  60 mg Intravenous BID  . lisinopril  10 mg Oral Daily  . montelukast  10 mg Oral QHS  . sodium chloride flush  3 mL Intravenous Q12H   Continuous Infusions: PRN Meds:.sodium chloride, acetaminophen, ondansetron (ZOFRAN) IV, sodium chloride flush  Radiology Reports Dg Chest 2 View  Result Date: 01/15/2017 CLINICAL DATA:  Shortness of Breath EXAM: CHEST  2 VIEW COMPARISON:  12/17/2016 FINDINGS: Cardiac shadow is at the upper limits of normal in size but stable. Lungs are well aerated bilaterally. Mild increase in vascular congestion is noted without interstitial edema. No focal infiltrate or effusion is seen. No  acute bony abnormality is noted. IMPRESSION: Mild vascular congestion without interstitial edema. Electronically Signed   By: Inez Catalina M.D.   On: 01/15/2017 12:08   Time Spent in minutes  25  Bethena Roys M.D on 01/18/2017 at 7:22 PM  Between 7am to 7pm - Pager [phone]  After 7pm go to [email or web address] - password [email or web address]  [facility] -  Office  [phone]

## 2017-01-19 DIAGNOSIS — I5023 Acute on chronic systolic (congestive) heart failure: Secondary | ICD-10-CM

## 2017-01-19 LAB — BASIC METABOLIC PANEL
Anion gap: 6 (ref 5–15)
BUN: 27 mg/dL — AB (ref 6–20)
CALCIUM: 9.2 mg/dL (ref 8.9–10.3)
CO2: 33 mmol/L — ABNORMAL HIGH (ref 22–32)
CREATININE: 1.11 mg/dL — AB (ref 0.44–1.00)
Chloride: 99 mmol/L — ABNORMAL LOW (ref 101–111)
GFR, EST NON AFRICAN AMERICAN: 56 mL/min — AB (ref >60)
Glucose, Bld: 86 mg/dL (ref 65–99)
Potassium: 3.7 mmol/L (ref 3.5–5.1)
SODIUM: 138 mmol/L (ref 135–145)

## 2017-01-19 MED ORDER — FUROSEMIDE 40 MG PO TABS
40.0000 mg | ORAL_TABLET | Freq: Every day | ORAL | Status: DC
  Administered 2017-01-19 – 2017-01-20 (×2): 40 mg via ORAL
  Filled 2017-01-19 (×2): qty 1

## 2017-01-19 MED ORDER — FUROSEMIDE 40 MG PO TABS: 40.0000 mg | ORAL_TABLET | Freq: Two times a day (BID) | ORAL | Status: DC

## 2017-01-19 MED ORDER — CARVEDILOL 3.125 MG PO TABS
3.1250 mg | ORAL_TABLET | Freq: Two times a day (BID) | ORAL | Status: DC
  Administered 2017-01-19 – 2017-01-20 (×2): 3.125 mg via ORAL
  Filled 2017-01-19 (×2): qty 1

## 2017-01-19 MED ORDER — FUROSEMIDE 40 MG PO TABS: 40.0000 mg | ORAL_TABLET | Freq: Every day | ORAL | Status: DC

## 2017-01-19 NOTE — Progress Notes (Signed)
    Subjective:  Denies SSCP, palpitations or Dyspnea Improved since in hospital   Objective:  Vitals:   01/18/17 0520 01/18/17 1305 01/18/17 2157 01/19/17 0453  BP: 124/82 (!) 113/54 128/65 124/65  Pulse: 81 86  86  Resp: 20 20 20 17   Temp: 98 F (36.7 C) 97.8 F (36.6 C) 98 F (36.7 C) 98.2 F (36.8 C)  TempSrc: Oral Oral Oral Oral  SpO2: 96% 97% 96% 93%  Weight: 206 lb 12.7 oz (93.8 kg)   205 lb 6.4 oz (93.2 kg)  Height:        Intake/Output from previous day:  Intake/Output Summary (Last 24 hours) at 01/19/17 0915 Last data filed at 01/19/17 0458  Gross per 24 hour  Intake              680 ml  Output              800 ml  Net             -120 ml    Physical Exam: Affect appropriate Healthy:  appears stated age HEENT: normal Neck supple with no adenopathy JVP normal no bruits no thyromegaly Lungs clear with no wheezing and good diaphragmatic motion Heart:  S1/S2 no murmur, no rub, gallop or click PMI normal Abdomen: benighn, BS positve, no tenderness, no AAA no bruit.  No HSM or HJR Distal pulses intact with no bruits No edema Neuro non-focal Skin warm and dry No muscular weakness   Lab Results: Basic Metabolic Panel:  Recent Labs  01/18/17 0713 01/19/17 0502  NA 138 138  K 3.6 3.7  CL 100* 99*  CO2 31 33*  GLUCOSE 87 86  BUN 27* 27*  CREATININE 1.04* 1.11*  CALCIUM 9.2 9.2  MG 2.1  --    CBC:  Recent Labs  01/18/17 0713  WBC 5.2  HGB 13.8  HCT 42.5  MCV 88.7  PLT 399    Imaging: No results found.  Cardiac Studies:  ECG: ST rate 104 IVCD nonspecific ST changes    Telemetry:  NSR 01/19/2017   Echo: 01/16/17  Personally reviewed EF 30-35%   Medications:   . aspirin EC  81 mg Oral Daily  . atorvastatin  40 mg Oral q1800  . azelastine  2 spray Each Nare BID  . enoxaparin (LOVENOX) injection  40 mg Subcutaneous Q24H  . [START ON 01/20/2017] furosemide  40 mg Oral Daily  . lisinopril  10 mg Oral Daily  . montelukast  10 mg Oral  QHS  . sodium chloride flush  3 mL Intravenous Q12H      Assessment/Plan:  CHF:  New diagnosis of acute systolic CHF Discussed with patient. Add coreg and continue ACE and diuretic D/C in am and will arrange outpatient f/u with Dr Harl Bowie Will need right and left heart cath once on stable medical regiman  Chol  On statin   Jenkins Rouge 01/19/2017, 9:15 AM

## 2017-01-19 NOTE — Progress Notes (Signed)
PROGRESS NOTE                                                                                                                                                                                                             Patient Demographics:    Michelle David, is a 56 y.o. female, DOB - Sep 24, 1961, IEP:[id number]  Admit date - 01/15/2017   Admitting Physician Karmen Bongo, MD  Outpatient Primary MD for the patient is Tula Nakayama, MD  LOS - 3  Outpatient Specialists:NONE  Chief Complaint  Patient presents with  . Shortness of Breath       Brief Narrative   56 year old obese female with history of hypertension presented with progressive shortness of breath for almost 2 months. Patient  with dyspnea on minimal exertion , 3 pillow orthopnea and PND. Reports nonproductive cough and some nausea. Had some funny pressure-like feeling in the chest, nonradiating, no palpitations, headache or blurred vision. Denied vomiting, fevers, chills, abdominal pain, bowel or urinary symptoms. No sick contact or recent travel. She came to the ED where she was found to have pulmonary vascular congestion on chest x-ray, elevated BNP. She was given IV Lasix and admitted to hospitalist service.   Subjective:   Patient symptoms have improved. She denies chest pain.    Assessment  & Plan :    Principal Problem:   Acute  systolic and diastolic congestive heart failure ([place])- 2-D echo done with severely reduced EF of 30-35% with diffuse hypokinesis. Also has grade 2 diastolic dysfunction, mild AR, MR . - d/c IV lasix, start 40mg  PO lasix daily -Cont aspirin, lisinopril, Coreg started today by cards. - Cardiology consult appreciated,D/c am, f/u with Dr Harl Bowie.  - Hgba1c pending - Slight bump in Cr and Co2, will d/c IV duiresis, pt ready for d/c, but will f/u on tolerance of new meds then D/c.  Active Problems: hypertension Cont  lisinopril 10 mg daily - Started BB today, low dose    Chest pain Appears to be demand ischemia with CHF. No EKG changes. Mildly elevated troponin which has resolved.    Prediabetes- Hx. blood sugars actually lo normal here. Will check hgba1c considering CHF.    Hyperlipidemia LDL goal <100 - with low EF possible coronary artery disease, will increase Lipitor to 40 daily  Code Status : Full code  Family  Communication  : Husband on the phone. All questions answered  Disposition Plan  : Home possibly, following diuresis and cardiology evaluation and recommendations for systolic dysfunction, ?CATH.  Barriers For Discharge : Active symptoms  Consults  :  Cardiology  Procedures  : 2-D echo  DVT Prophylaxis  :  Lovenox -  Lab Results  Component Value Date   PLT 399 01/18/2017    Antibiotics  :  NOne   Anti-infectives    None        Objective:   Vitals:   01/18/17 0520 01/18/17 1305 01/18/17 2157 01/19/17 0453  BP: 124/82 (!) 113/54 128/65 124/65  Pulse: 81 86  86  Resp: 20 20 20 17   Temp: 98 F (36.7 C) 97.8 F (36.6 C) 98 F (36.7 C) 98.2 F (36.8 C)  TempSrc: Oral Oral Oral Oral  SpO2: 96% 97% 96% 93%  Weight: 93.8 kg (206 lb 12.7 oz)   93.2 kg (205 lb 6.4 oz)  Height:        Wt Readings from Last 3 Encounters:  01/19/17 93.2 kg (205 lb 6.4 oz)  12/17/16 97.6 kg (215 lb 1.9 oz)  08/27/16 100.7 kg (222 lb)     Intake/Output Summary (Last 24 hours) at 01/19/17 1320 Last data filed at 01/19/17 1150  Gross per 24 hour  Intake              680 ml  Output              950 ml  Net             -270 ml    Physical Exam  Gen: not in distress, sitting on side of bed, alert and oriented, HEENT: moist mucosa, supple neck Chest: No crackles appreciated, no wheeze CVS: regular S5&K8,  2/6 systolic murmur Vs soft S3. GI: soft, NT, ND,  Musculoskeletal: warm,still with  trace edema   Data Review:    CBC  Recent Labs Lab 01/15/17 1325 01/15/17 2358  01/18/17 0713  WBC 5.3 5.6 5.2  HGB 12.4 11.4* 13.8  HCT 38.9 35.4* 42.5  PLT 399 326 399  MCV 88.2 88.1 88.7  MCH 28.1 28.4 28.8  MCHC 31.9 32.2 32.5  RDW 15.1 15.1 15.2  LYMPHSABS 2.2 2.2  --   MONOABS 0.3 0.2  --   EOSABS 0.1 0.2  --   BASOSABS 0.0 0.0  --     Chemistries   Recent Labs Lab 01/15/17 1325 01/16/17 0632 01/17/17 0639 01/18/17 0713 01/19/17 0502  NA 139 139 142 138 138  K 3.5 3.2* 3.8 3.6 3.7  CL 104 101 105 100* 99*  CO2 27 30 29 31  33*  GLUCOSE 81 83 88 87 86  BUN 25* 21* 23* 27* 27*  CREATININE 1.02* 0.83 0.94 1.04* 1.11*  CALCIUM 9.1 8.6* 9.3 9.2 9.2  MG  --  1.7  --  2.1  --    Cardiac Enzymes  Recent Labs Lab 01/15/17 1816 01/15/17 2358 01/16/17 0632  TROPONINI 0.03* 0.03* <0.03   ------------------------------------------------------------------------------------------------------------------    Component Value Date/Time   BNP 622.0 (H) 01/15/2017 1325   Inpatient Medications  Scheduled Meds: . aspirin EC  81 mg Oral Daily  . atorvastatin  40 mg Oral q1800  . azelastine  2 spray Each Nare BID  . carvedilol  3.125 mg Oral BID WC  . enoxaparin (LOVENOX) injection  40 mg Subcutaneous Q24H  . furosemide  40 mg Oral Daily  . lisinopril  10 mg Oral Daily  . montelukast  10 mg Oral QHS  . sodium chloride flush  3 mL Intravenous Q12H   Continuous Infusions: PRN Meds:.sodium chloride, acetaminophen, ondansetron (ZOFRAN) IV, sodium chloride flush  Radiology Reports Dg Chest 2 View  Result Date: 01/15/2017 CLINICAL DATA:  Shortness of Breath EXAM: CHEST  2 VIEW COMPARISON:  12/17/2016 FINDINGS: Cardiac shadow is at the upper limits of normal in size but stable. Lungs are well aerated bilaterally. Mild increase in vascular congestion is noted without interstitial edema. No focal infiltrate or effusion is seen. No acute bony abnormality is noted. IMPRESSION: Mild vascular congestion without interstitial edema. Electronically Signed   By:  Inez Catalina M.D.   On: 01/15/2017 12:08   Time Spent in minutes  25  Kewon Statler E Aldine Grainger M.D on 01/19/2017 at 1:20 PM  Between 7am to 7pm - Pager - [phone]  After 7pm go to [email or web address] - password [facility]  [facility] -  Office  [phone]

## 2017-01-20 ENCOUNTER — Encounter: Payer: Self-pay | Admitting: Internal Medicine

## 2017-01-20 LAB — HEMOGLOBIN A1C
HEMOGLOBIN A1C: 5.3 % (ref 4.8–5.6)
MEAN PLASMA GLUCOSE: 105 mg/dL

## 2017-01-20 MED ORDER — ASPIRIN 81 MG PO TBEC: 81.0000 mg | DELAYED_RELEASE_TABLET | Freq: Every day | ORAL | 0 refills | Status: DC

## 2017-01-20 MED ORDER — ATORVASTATIN CALCIUM 40 MG PO TABS: 40.0000 mg | ORAL_TABLET | Freq: Every day | ORAL | 0 refills | Status: DC

## 2017-01-20 MED ORDER — FUROSEMIDE 40 MG PO TABS: 40.0000 mg | ORAL_TABLET | Freq: Every day | ORAL | 0 refills | Status: DC

## 2017-01-20 MED ORDER — LISINOPRIL 10 MG PO TABS: 10.0000 mg | ORAL_TABLET | Freq: Every day | ORAL | 0 refills | Status: DC

## 2017-01-20 MED ORDER — CARVEDILOL 3.125 MG PO TABS: 3.1250 mg | ORAL_TABLET | Freq: Two times a day (BID) | ORAL | 0 refills | Status: DC

## 2017-01-20 NOTE — Progress Notes (Signed)
    Subjective:  Wants to go home Right antecubital iv irritated Nasal congestion   Objective:  Vitals:   01/19/17 1413 01/19/17 1807 01/19/17 2211 01/20/17 0500  BP: 127/73  (!) 101/51 125/61  Pulse: 91 94 90 64  Resp: 18  18 18   Temp: 97.8 F (36.6 C)  98.4 F (36.9 C) 98.1 F (36.7 C)  TempSrc: Oral  Oral Oral  SpO2: 97%  96% 96%  Weight:    207 lb 4 oz (94 kg)  Height:        Intake/Output from previous day:  Intake/Output Summary (Last 24 hours) at 01/20/17 0810 Last data filed at 01/20/17 0500  Gross per 24 hour  Intake              480 ml  Output              600 ml  Net             -120 ml    Physical Exam: Affect appropriate Healthy:  appears stated age HEENT: normal Neck supple with no adenopathy JVP normal no bruits no thyromegaly Lungs clear with no wheezing and good diaphragmatic motion Heart:  S1/S2 no murmur, no rub, gallop or click PMI normal Abdomen: benighn, BS positve, no tenderness, no AAA no bruit.  No HSM or HJR Distal pulses intact with no bruits No edema Neuro non-focal Skin warm and dry No muscular weakness   Lab Results: Basic Metabolic Panel:  Recent Labs  01/18/17 0713 01/19/17 0502  NA 138 138  K 3.6 3.7  CL 100* 99*  CO2 31 33*  GLUCOSE 87 86  BUN 27* 27*  CREATININE 1.04* 1.11*  CALCIUM 9.2 9.2  MG 2.1  --    CBC:  Recent Labs  01/18/17 0713  WBC 5.2  HGB 13.8  HCT 42.5  MCV 88.7  PLT 399    Imaging: No results found.  Cardiac Studies:  ECG: ST rate 104 IVCD nonspecific ST changes    Telemetry:  NSR 01/20/2017   Echo: 01/16/17  Personally reviewed EF 30-35%   Medications:   . aspirin EC  81 mg Oral Daily  . atorvastatin  40 mg Oral q1800  . azelastine  2 spray Each Nare BID  . carvedilol  3.125 mg Oral BID WC  . enoxaparin (LOVENOX) injection  40 mg Subcutaneous Q24H  . furosemide  40 mg Oral Daily  . lisinopril  10 mg Oral Daily  . montelukast  10 mg Oral QHS  . sodium chloride flush  3 mL  Intravenous Q12H      Assessment/Plan:  CHF:  New diagnosis of acute systolic CHF Discussed with patient. Add coreg and continue ACE and diuretic D/C home nd will arrange outpatient f/u with Dr Harl Bowie Will need right and left heart cath once on stable medical regiman  Chol  On statin  URI:  Nasal congestion use nasal spray / flonase  Jenkins Rouge 01/20/2017, 8:10 AM

## 2017-01-20 NOTE — Discharge Summary (Signed)
Physician Discharge Summary  Michelle David JSH:[id number] DOB: 1960/12/26 DOA: 01/15/2017  PCP: Tula Nakayama, MD  Admit date: 01/15/2017 Discharge date: 01/20/2017  Admitted From: Home  Disposition: Home   Recommendations for Outpatient Follow-up:  1. Follow up with PCP, Cardiology in 1-2 weeks 2. Please obtain BMP/CBC in one week  Home Health: No Equipment/Devices:  No   Discharge Condition:  Stable  CODE STATUS:  Full  Diet recommendation: Heart Healthy   HPI- by Dr. Lorin Mercy.   Chief Complaint: SOB HPI: Michelle David is a 56 y.o. female with medical history significant of morbid obesity, impaired glucose tolerance, and HTN presenting because she has been feeling "rough."  Came in because she couldn't breathe, has been feeling like this for a bout a month.  Unable to lie flat.  Has to sit down while walking because she can't catch her breath.  +cough, nonproductive, constant.  No fevers.  Some congestion.  +nausea.  Has not noticed LE edema.  Takes fluid pills (Maxzide), goes to the bathroom a lot.   Normal echocardiogram in 2012.  +PND.  3-pillow orthopnea.  Has seen her PCP and diagnosed with bronchitis.  Today, she was at work and her supervisor sent her to the ER because her face was flushed and she was so SOB.  Funny feeling in chest, queasy funny feeling, +pressure.  She saw Dr. Moshe Cipro on 2/28 and at that time complained of cough and SOB x 2 months with poor exercise tolerance and wheezing, chronic fatigue, excessive snoring.  She was diagnosed with seasonal allergies and morbid obesity and there was a note of non-compliant behavior.    She was referred for a sleep study, although it does not appear that she has gone for this.    ED Course: Thought to have CHF, given Lasix 60 mg IV and ASA 324 mg  Discharge Diagnoses:  Principal Problem:   CHF exacerbation ([place]) Active Problems:   Morbid obesity   HTN (hypertension), malignant   Chest pain   Prediabetes    Hyperlipidemia LDL goal <100   Acute combined systolic and diastolic congestive heart failure ([facility])  Principal Problem:   Acute  systolic and diastolic congestive heart failure ([place])- 2-D echo done with severely reduced EF of 30-35% with diffuse hypokinesis. Also has grade 2 diastolic dysfunction, mild AR, MR . TSH- 2.6, Hgba1c- 5.4. - Diuresed with IV Lasix, to bump in creatinine and CO2, is charged home to continue 40 mg daily of Lasix -Cont aspirin, lisinopril, Coreg, statin on discharge. - Cardiology consulted, will need right and left heart to be arranged As outpatient, follow-up with Dr. Harl Bowie will be arranged by cardiology - Discharge weight 207 pounds.  Active Problems: hypertension Home triamterene HCTZ discontinued. Patient discharged home on Coreg 3.25 mg twice a day, lisinopril 10 mg daily. Also on Lasix 40 mg daily.    Chest pain, with mild troponin elevation- 0.03, resolved. Appears to be demand ischemia with CHF. No EKG changes. Mildly elevated troponin which has resolved.    Prediabetes- Hx. blood sugars actually lo normal here. Hgba1c- 5.    Hyperlipidemia LDL goal <100 LDL- 117, discharged home with liptor 40mg  daily.  Discharge Instructions  Discharge Instructions    Diet - low sodium heart healthy    Complete by:  As directed    Discharge instructions    Complete by:  As directed    We have prescribed 2 new medications for you, please them as prescribed. It is very important  you follow up with your primary care doctor by Friday.   Increase activity slowly    Complete by:  As directed      Allergies as of 01/20/2017   No Known Allergies     Medication List    STOP taking these medications   triamterene-hydrochlorothiazide 75-50 MG tablet Commonly known as:  MAXZIDE     TAKE these medications   aspirin 81 MG EC tablet Take 1 tablet (81 mg total) by mouth daily. Start taking on:  01/21/2017   atorvastatin 40 MG tablet Commonly known as:   LIPITOR Take 1 tablet (40 mg total) by mouth daily at 6 PM.   azelastine 0.1 % nasal spray Commonly known as:  ASTELIN Place 2 sprays into both nostrils 2 (two) times daily. Use in each nostril as directed   carvedilol 3.125 MG tablet Commonly known as:  COREG Take 1 tablet (3.125 mg total) by mouth 2 (two) times daily with a meal.   furosemide 40 MG tablet Commonly known as:  LASIX Take 1 tablet (40 mg total) by mouth daily. Start taking on:  01/21/2017   lisinopril 10 MG tablet Commonly known as:  PRINIVIL,ZESTRIL Take 1 tablet (10 mg total) by mouth daily. Start taking on:  01/21/2017   montelukast 10 MG tablet Commonly known as:  SINGULAIR Take 1 tablet (10 mg total) by mouth at bedtime.       No Known Allergies  Consultations:  Cardiology  Procedures/Studies: Dg Chest 2 View  Result Date: 01/15/2017 CLINICAL DATA:  Shortness of Breath EXAM: CHEST  2 VIEW COMPARISON:  12/17/2016 FINDINGS: Cardiac shadow is at the upper limits of normal in size but stable. Lungs are well aerated bilaterally. Mild increase in vascular congestion is noted without interstitial edema. No focal infiltrate or effusion is seen. No acute bony abnormality is noted. IMPRESSION: Mild vascular congestion without interstitial edema. Electronically Signed   By: Inez Catalina M.D.   On: 01/15/2017 12:08   ECHO_- 3/30-   - Left ventricle: The cavity size was normal. Wall thickness was   increased increased in a pattern of mild to moderate LVH.   Systolic function was moderately to severely reduced. The   estimated ejection fraction was in the range of 30% to 35%.   Diffuse hypokinesis. Features are consistent with a pseudonormal   left ventricular filling pattern, with concomitant abnormal   relaxation and increased filling pressure (grade 2 diastolic   dysfunction). Doppler parameters are consistent with high   ventricular filling pressure. - Aortic valve: There was mild regurgitation. Valve area  (VTI):   1.78 cm^2. Valve area (Vmax): 1.77 cm^2. - Mitral valve: There was mild regurgitation. - Left atrium: The atrium was mildly dilated. - Technically difficult study. Echocontrast was used to enhance   visualization.  Subjective:  Ready to go home. Denies shortness of breath or chest pains. Husband at bedside all questions answered questions answered. Emphasized follow-up with PCP and cardiology  Discharge Exam: Vitals:   01/19/17 2211 01/20/17 0500  BP: (!) 101/51 125/61  Pulse: 90 64  Resp: 18 18  Temp: 98.4 F (36.9 C) 98.1 F (36.7 C)   Vitals:   01/19/17 1413 01/19/17 1807 01/19/17 2211 01/20/17 0500  BP: 127/73  (!) 101/51 125/61  Pulse: 91 94 90 64  Resp: 18  18 18   Temp: 97.8 F (36.6 C)  98.4 F (36.9 C) 98.1 F (36.7 C)  TempSrc: Oral  Oral Oral  SpO2: 97%  96% 96%  Weight:    94 kg (207 lb 4 oz)  Height:        General: Pt is alert, awake, not in acute distress Cardiovascular: RRR, S1/S2 +, no rubs, no gallops Respiratory: CTA bilaterally, no wheezing, no rhonchi, no crackles. Abdominal: Soft, NT, ND, bowel sounds + Extremities: no edema, no cyanosis  The results of significant diagnostics from this hospitalization (including imaging, microbiology, ancillary and laboratory) are listed below for reference.    Microbiology: No results found for this or any previous visit (from the past 240 hour(s)).   Labs: BNP (last 3 results)  Recent Labs  01/15/17 1325  BNP 947.6*   Basic Metabolic Panel:  Recent Labs Lab 01/15/17 1325 01/16/17 0632 01/17/17 0639 01/18/17 0713 01/19/17 0502  NA 139 139 142 138 138  K 3.5 3.2* 3.8 3.6 3.7  CL 104 101 105 100* 99*  CO2 27 30 29 31  33*  GLUCOSE 81 83 88 87 86  BUN 25* 21* 23* 27* 27*  CREATININE 1.02* 0.83 0.94 1.04* 1.11*  CALCIUM 9.1 8.6* 9.3 9.2 9.2  MG  --  1.7  --  2.1  --    CBC:  Recent Labs Lab 01/15/17 1325 01/15/17 2358 01/18/17 0713  WBC 5.3 5.6 5.2  NEUTROABS 2.7 2.9  --    HGB 12.4 11.4* 13.8  HCT 38.9 35.4* 42.5  MCV 88.2 88.1 88.7  PLT 399 326 399   Cardiac Enzymes:  Recent Labs Lab 01/15/17 1325 01/15/17 1816 01/15/17 2358 01/16/17 0632  TROPONINI 0.03* 0.03* 0.03* <0.03   Hgb A1c  Recent Labs  01/19/17 0502  HGBA1C 5.3   Urinalysis    Component Value Date/Time   COLORURINE YELLOW 08/05/2016 1414   APPEARANCEUR CLEAR 08/05/2016 1414   LABSPEC 1.020 08/05/2016 1414   PHURINE 6.0 08/05/2016 1414   GLUCOSEU NEGATIVE 08/05/2016 1414   GLUCOSEU NEG mg/dL 12/11/2007 0434   HGBUR TRACE (A) 08/05/2016 1414   HGBUR trace-intact 03/04/2010 0000   BILIRUBINUR NEGATIVE 08/05/2016 1414   BILIRUBINUR neg 04/10/2016 1529   KETONESUR NEGATIVE 08/05/2016 1414   PROTEINUR NEGATIVE 08/05/2016 1414   UROBILINOGEN 1.0 04/10/2016 1529   UROBILINOGEN 1.0 03/04/2010 0000   NITRITE NEGATIVE 08/05/2016 1414   LEUKOCYTESUR TRACE (A) 08/05/2016 1414    Time coordinating discharge: Over 30 minutes  SIGNED:  Bethena Roys, MD  [facility] 01/20/2017, 10:02 AM Pager [phone]  If 7PM-7AM, please contact night-coverage [email or web address] Password [email or web address]

## 2017-01-20 NOTE — Progress Notes (Signed)
Patient's IV removed.  Site WNL.  AVS reviewed with patient.  Verbalized understanding of discharge instructions, physician follow-up, medications, heart failure education with packet given.  Work note given to patient.  Patient transported by RN via wheelchair to entrance for discharge.  Patient stable at time of discharge.

## 2017-01-20 NOTE — Care Management Note (Signed)
Case Management Note  Patient Details  Name: Michelle David MRN: [id number] Date of Birth: Jan 31, 1961  Expected Discharge Date:  01/20/17               Expected Discharge Plan:  Home/Self Care  In-House Referral:  NA  Discharge planning Services  CM Consult  Post Acute Care Choice:  NA Choice offered to:  NA  Status of Service:  Completed, signed off  If discussed at [place] of Stay Meetings, dates discussed:  01/20/2017  Additional Comments: Pt discharging home today with self care.   Sherald Barge, RN 01/20/2017, 9:59 AM

## 2017-01-22 ENCOUNTER — Ambulatory Visit: Payer: BLUE CROSS/BLUE SHIELD | Admitting: Family Medicine

## 2017-01-22 ENCOUNTER — Telehealth: Payer: Self-pay

## 2017-01-22 NOTE — Telephone Encounter (Signed)
Transition Care Management Follow-up Telephone Call   Date discharged? 01/20/2017   How have you been since you were released from the hospital? Patient states she is doing good, no further SOB or nausea. She does still have a slight cough but is non productive.   Do you understand why you were in the hospital? yes   Do you understand the discharge instructions? yes   Where were you discharged to? Home   Items Reviewed:  Medications reviewed: yes  Allergies reviewed: yes  Dietary changes reviewed: yes  Referrals reviewed: yes   Functional Questionnaire:   Activities of Daily Living (ADLs):   She states they are independent in the following: ambulation, bathing and hygiene, feeding, continence, grooming, toileting and dressing States they require assistance with the following: None   Any transportation issues/concerns?: no   Any patient concerns? no   Confirmed importance and date/time of follow-up visits scheduled yes  Provider Appointment booked with Dr. Moshe Cipro on 01/26/2017 at 2:00 pm.  Confirmed with patient if condition begins to worsen call PCP or go to the ER.  Patient was given the office number and encouraged to call back with question or concerns.  : Yes

## 2017-01-22 NOTE — Telephone Encounter (Addendum)
Patient contacted regarding discharge from [facility] on 01/20/17.  Patient understands to follow up with provider [facility] NP on 01/29/17 at 250 pm at [place]. Patient understands discharge instructions? Yes Patient understands medications and regiment? Yes Patient understands to bring all medications to this visit? Yes  [id number]  01/23/17 Reached patient

## 2017-01-22 NOTE — Telephone Encounter (Signed)
-----   Message from Orinda Kenner sent at 01/20/2017  2:00 PM EDT ----- Regarding: TOC TOC d/c'd 01/20/17 from [facility]Marland Kitchen  Thanks, Coralyn Mark

## 2017-01-26 ENCOUNTER — Encounter: Payer: Self-pay | Admitting: Family Medicine

## 2017-01-26 ENCOUNTER — Ambulatory Visit: Payer: BLUE CROSS/BLUE SHIELD | Admitting: Family Medicine

## 2017-01-27 ENCOUNTER — Ambulatory Visit (INDEPENDENT_AMBULATORY_CARE_PROVIDER_SITE_OTHER): Payer: BLUE CROSS/BLUE SHIELD | Admitting: Family Medicine

## 2017-01-27 ENCOUNTER — Other Ambulatory Visit: Payer: Self-pay | Admitting: Family Medicine

## 2017-01-27 ENCOUNTER — Encounter: Payer: Self-pay | Admitting: Family Medicine

## 2017-01-27 VITALS — BP 140/92 | HR 83 | Resp 16 | Ht 62.0 in | Wt 213.0 lb

## 2017-01-27 DIAGNOSIS — I10 Essential (primary) hypertension: Secondary | ICD-10-CM

## 2017-01-27 DIAGNOSIS — Z09 Encounter for follow-up examination after completed treatment for conditions other than malignant neoplasm: Secondary | ICD-10-CM | POA: Diagnosis not present

## 2017-01-27 DIAGNOSIS — I5041 Acute combined systolic (congestive) and diastolic (congestive) heart failure: Secondary | ICD-10-CM

## 2017-01-27 NOTE — Patient Instructions (Addendum)
f/U as before, call if you need me sooner.  Labs today, CBC and bMP   Limit salt/ sodium intake, avaoid canned foos, if you use rinse excessively  Limit water to 2 litrers daily  No soda  bP slightly high, you have gained 7 pounds in 1 week, script for scale esnt  To your pharmacy   Heart Failure Heart failure means your heart has trouble pumping blood. This makes it hard for your body to work well. Heart failure is usually a long-term (chronic) condition. You must take good care of yourself and follow your doctor's treatment plan. Follow these instructions at home:  Take your heart medicine as told by your doctor.  Do not stop taking medicine unless your doctor tells you to.  Do not skip any dose of medicine.  Refill your medicines before they run out.  Take other medicines only as told by your doctor or pharmacist.  Stay active if told by your doctor. The elderly and people with severe heart failure should talk with a doctor about physical activity.  Eat heart-healthy foods. Choose foods that are without trans fat and are low in saturated fat, cholesterol, and salt (sodium). This includes fresh or frozen fruits and vegetables, fish, lean meats, fat-free or low-fat dairy foods, whole grains, and high-fiber foods. Lentils and dried peas and beans (legumes) are also good choices.  Limit salt if told by your doctor.  Cook in a healthy way. Roast, grill, broil, bake, poach, steam, or stir-fry foods.  Limit fluids as told by your doctor.  Weigh yourself every morning. Do this after you pee (urinate) and before you eat breakfast. Write down your weight to give to your doctor.  Take your blood pressure and write it down if your doctor tells you to.  Ask your doctor how to check your pulse. Check your pulse as told.  Lose weight if told by your doctor.  Stop smoking or chewing tobacco. Do not use gum or patches that help you quit without your doctor's approval.  Schedule and  go to doctor visits as told.  Nonpregnant women should have no more than 1 drink a day. Men should have no more than 2 drinks a day. Talk to your doctor about drinking alcohol.  Stop illegal drug use.  Stay current with shots (immunizations).  Manage your health conditions as told by your doctor.  Learn to manage your stress.  Rest when you are tired.  If it is really hot outside:  Avoid intense activities.  Use air conditioning or fans, or get in a cooler place.  Avoid caffeine and alcohol.  Wear loose-fitting, lightweight, and light-colored clothing.  If it is really cold outside:  Avoid intense activities.  Layer your clothing.  Wear mittens or gloves, a hat, and a scarf when going outside.  Avoid alcohol.  Learn about heart failure and get support as needed.  Get help to maintain or improve your quality of life and your ability to care for yourself as needed. Contact a doctor if:  You gain weight quickly.  You are more short of breath than usual.  You cannot do your normal activities.  You tire easily.  You cough more than normal, especially with activity.  You have any or more puffiness (swelling) in areas such as your hands, feet, ankles, or belly (abdomen).  You cannot sleep because it is hard to breathe.  You feel like your heart is beating fast (palpitations).  You get dizzy or light-headed when  you stand up. Get help right away if:  You have trouble breathing.  There is a change in mental status, such as becoming less alert or not being able to focus.  You have chest pain or discomfort.  You faint. This information is not intended to replace advice given to you by your health care provider. Make sure you discuss any questions you have with your health care provider. Document Released: 07/15/2008 Document Revised: 03/13/2016 Document Reviewed: 11/22/2012 Elsevier Interactive Patient Education  2017 [employer].

## 2017-01-28 DIAGNOSIS — Z09 Encounter for follow-up examination after completed treatment for conditions other than malignant neoplasm: Secondary | ICD-10-CM | POA: Insufficient documentation

## 2017-01-28 LAB — CBC
HCT: 37.1 % (ref 35.0–45.0)
HEMOGLOBIN: 11.5 g/dL — AB (ref 11.7–15.5)
MCH: 27 pg (ref 27.0–33.0)
MCHC: 31 g/dL — AB (ref 32.0–36.0)
MCV: 87.1 fL (ref 80.0–100.0)
MPV: 8.9 fL (ref 7.5–12.5)
PLATELETS: 385 10*3/uL (ref 140–400)
RBC: 4.26 MIL/uL (ref 3.80–5.10)
RDW: 15.3 % — ABNORMAL HIGH (ref 11.0–15.0)
WBC: 5.8 10*3/uL (ref 3.8–10.8)

## 2017-01-28 LAB — BASIC METABOLIC PANEL
BUN: 18 mg/dL (ref 7–25)
CALCIUM: 8.7 mg/dL (ref 8.6–10.4)
CHLORIDE: 102 mmol/L (ref 98–110)
CO2: 31 mmol/L (ref 20–31)
CREATININE: 0.97 mg/dL (ref 0.50–1.05)
GLUCOSE: 81 mg/dL (ref 65–99)
Potassium: 3.8 mmol/L (ref 3.5–5.3)
Sodium: 140 mmol/L (ref 135–146)

## 2017-01-28 NOTE — Assessment & Plan Note (Signed)
Improved, not at goal, will defer to cardiology she sees this week DASH diet and commitment to daily physical activity for a minimum of 30 minutes discussed and encouraged, as a part of hypertension management. The importance of attaining a healthy weight is also discussed.  BP/Weight 01/27/2017 01/20/2017 12/17/2016 08/27/2016 07/03/2016 9/0/2409 04/21/5328  Systolic BP 924 268 341 962 229 798 921  Diastolic BP 92 61 94 194 84 100 96  Wt. (Lbs) 213 207.25 215.12 222 220 225 227  BMI 38.96 37.91 39.35 40.6 40.24 41.14 41.51

## 2017-01-28 NOTE — Assessment & Plan Note (Addendum)
Hospitalized for acute CHF with new abnormalities on echocardiogram,cath planned. 7 pound weight gain since d/c las t week, educated re fluid and sodium restriction , needs scale for daily weight

## 2017-01-28 NOTE — Assessment & Plan Note (Signed)
New onset requiring 5 day hospital stay , will need cath as out pt

## 2017-01-28 NOTE — Progress Notes (Signed)
   JLEIGH STRIPLIN     MRN: [id number]      DOB: Jan 07, 1961   HPI Ms. Shek  Patient in for follow up of recent hospitalization.from 3/29 to 01/20/2017, when she presented in acute heart failure. Discharge summary, and laboratory and radiology data are reviewed, and any questions or concerns about recent hospitalization are discussed. Specific issues requiring follow up are specifically addressed. Returned to work yesterday, on reduced activity as co workers are protective of her, still realizes exercise tolerance is not what it was, and she has unfortunately gained 7 pounds of fluid weight since her discharge. Has been drinking Sprite , eating canned foods , though states she is washing it. No fluid restriction in place either   ROS See HPI  Denies recent fever or chills. Denies sinus pressure, nasal congestion, ear pain or sore throat. Denies chest congestion, productive cough or wheezing. Denies PND , orthopnea or leg swelling currently, states they were present when admitted Denies abdominal pain, nausea, vomiting,diarrhea or constipation.   Denies dysuria, frequency, hesitancy or incontinence. Denies joint pain, swelling and limitation in mobility. Denies headaches, seizures, numbness, or tingling. Denies depression, anxiety or insomnia. Denies skin break down or rash.   PE  BP (!) 140/92   Pulse 83   Resp 16   Ht 5\' 2"  (1.575 m)   Wt 213 lb (96.6 kg)   SpO2 100%   BMI 38.96 kg/m   Patient alert and oriented and in no cardiopulmonary distress.  HEENT: No facial asymmetry, EOMI,   oropharynx pink and moist.  Neck supple no JVD, no mass.  Chest: Clear to auscultation bilaterally.  CVS: S1, S2 no murmurs, no S3.Regular rate.  ABD: Soft non tender.   Ext: Trace edema  MS: Adequate ROM spine, shoulders, hips and knees.  Skin: Intact, no ulcerations or rash noted.  Psych: Good eye contact, normal affect. Memory intact not anxious or depressed  appearing.  CNS: CN 2-12 intact, power,  normal throughout.no focal deficits noted.   [facility] discharge follow-up Hospitalized for acute CHF with new abnormalities on echocardiogram,cath planned. 7 pound weight gain since d/c las t week, educated re fluid and sodium restriction , needs scale for daily weight    HTN (hypertension), malignant Improved, not at goal, will defer to cardiology she sees this week DASH diet and commitment to daily physical activity for a minimum of 30 minutes discussed and encouraged, as a part of hypertension management. The importance of attaining a healthy weight is also discussed.  BP/Weight 01/27/2017 01/20/2017 12/17/2016 08/27/2016 07/03/2016 02/18/801 11/22/3610  Systolic BP 244 975 300 511 021 117 356  Diastolic BP 92 61 94 701 84 100 96  Wt. (Lbs) 213 207.25 215.12 222 220 225 227  BMI 38.96 37.91 39.35 40.6 40.24 41.14 41.51       Acute combined systolic and diastolic congestive heart failure ([facility]) New onset requiring 5 day hospital stay , will need cath as out pt

## 2017-01-29 ENCOUNTER — Telehealth: Payer: Self-pay | Admitting: Family Medicine

## 2017-01-29 ENCOUNTER — Encounter: Payer: Self-pay | Admitting: Adult Health

## 2017-01-29 ENCOUNTER — Ambulatory Visit (INDEPENDENT_AMBULATORY_CARE_PROVIDER_SITE_OTHER): Payer: BLUE CROSS/BLUE SHIELD | Admitting: Adult Health

## 2017-01-29 VITALS — BP 150/90 | HR 98 | Ht 62.0 in | Wt 209.0 lb

## 2017-01-29 DIAGNOSIS — E7800 Pure hypercholesterolemia, unspecified: Secondary | ICD-10-CM

## 2017-01-29 DIAGNOSIS — I10 Essential (primary) hypertension: Secondary | ICD-10-CM

## 2017-01-29 DIAGNOSIS — I5021 Acute systolic (congestive) heart failure: Secondary | ICD-10-CM | POA: Diagnosis not present

## 2017-01-29 MED ORDER — CARVEDILOL 6.25 MG PO TABS: 6.2500 mg | ORAL_TABLET | Freq: Two times a day (BID) | ORAL | 3 refills | Status: DC

## 2017-01-29 NOTE — Progress Notes (Signed)
Name: Michelle David    DOB: 06-28-1961  Age: 56 y.o.  MR#: [id number]       PCP:  Tula Nakayama, MD      Insurance: Payor: [place] / Plan: [place] / Product Type: *No Product type* /   CC:   No chief complaint on file.   VS Vitals:   01/29/17 1459  BP: (!) 150/90  Pulse: 98  SpO2: 96%  Weight: 209 lb (94.8 kg)  Height: 5\' 2"  (1.575 m)    Weights Current Weight  01/29/17 209 lb (94.8 kg)  01/27/17 213 lb (96.6 kg)  01/20/17 207 lb 4 oz (94 kg)    Blood Pressure  BP Readings from Last 3 Encounters:  01/29/17 (!) 150/90  01/27/17 (!) 140/92  01/20/17 125/61     Admit date:  (Not on file) Last encounter with RMR:  Visit date not found   Allergy Patient has no known allergies.  Current Outpatient Prescriptions  Medication Sig Dispense Refill  . aspirin EC 81 MG EC tablet Take 1 tablet (81 mg total) by mouth daily. 30 tablet 0  . atorvastatin (LIPITOR) 40 MG tablet Take 1 tablet (40 mg total) by mouth daily at 6 PM. 30 tablet 0  . azelastine (ASTELIN) 0.1 % nasal spray Place 2 sprays into both nostrils 2 (two) times daily. Use in each nostril as directed 30 mL 12  . carvedilol (COREG) 3.125 MG tablet Take 1 tablet (3.125 mg total) by mouth 2 (two) times daily with a meal. 60 tablet 0  . furosemide (LASIX) 40 MG tablet Take 1 tablet (40 mg total) by mouth daily. 30 tablet 0  . lisinopril (PRINIVIL,ZESTRIL) 10 MG tablet Take 1 tablet (10 mg total) by mouth daily. 30 tablet 0  . montelukast (SINGULAIR) 10 MG tablet Take 1 tablet (10 mg total) by mouth at bedtime. 30 tablet 3   No current facility-administered medications for this visit.     Discontinued Meds:   There are no discontinued medications.  Patient Active Problem List   Diagnosis Date Noted  . Hospital discharge follow-up 01/28/2017  . Acute combined systolic and diastolic congestive heart failure ([place]) 01/17/2017  . CHF exacerbation ([place]) 01/15/2017  . Sleep apnea 12/17/2016  . Knee  pain, right 03/01/2015  . Solitary thyroid nodule 03/01/2015  . Seasonal allergies 04/24/2014  . Prediabetes 05/29/2013  . Hyperlipidemia LDL goal <100 05/29/2013  . Metabolic syndrome X 70/35/0093  . Non-compliant behavior 05/29/2013  . Heart murmur 04/22/2011  . Chest pain 04/07/2011  . Morbid obesity 03/31/2008  . HTN (hypertension), malignant 03/31/2008  . DYSPEPSIA 03/31/2008    LABS    Component Value Date/Time   NA 140 01/27/2017 1620   NA 138 01/19/2017 0502   NA 138 01/18/2017 0713   K 3.8 01/27/2017 1620   K 3.7 01/19/2017 0502   K 3.6 01/18/2017 0713   CL 102 01/27/2017 1620   CL 99 (L) 01/19/2017 0502   CL 100 (L) 01/18/2017 0713   CO2 31 01/27/2017 1620   CO2 33 (H) 01/19/2017 0502   CO2 31 01/18/2017 0713   GLUCOSE 81 01/27/2017 1620   GLUCOSE 86 01/19/2017 0502   GLUCOSE 87 01/18/2017 0713   BUN 18 01/27/2017 1620   BUN 27 (H) 01/19/2017 0502   BUN 27 (H) 01/18/2017 0713   CREATININE 0.97 01/27/2017 1620   CREATININE 1.11 (H) 01/19/2017 0502   CREATININE 1.04 (H) 01/18/2017 0713   CREATININE 0.94 01/17/2017  7353   CREATININE 0.75 03/20/2016 1143   CREATININE 0.67 03/01/2015 1429   CALCIUM 8.7 01/27/2017 1620   CALCIUM 9.2 01/19/2017 0502   CALCIUM 9.2 01/18/2017 0713   GFRNONAA 56 (L) 01/19/2017 0502   GFRNONAA >60 01/18/2017 0713   GFRNONAA >60 01/17/2017 0639   GFRNONAA >89 03/01/2015 1429   GFRNONAA 74 03/07/2014 1147   GFRNONAA >89 01/26/2012 1043   GFRAA >60 01/19/2017 0502   GFRAA >60 01/18/2017 0713   GFRAA >60 01/17/2017 0639   GFRAA >89 03/01/2015 1429   GFRAA 85 03/07/2014 1147   GFRAA >89 01/26/2012 1043   CMP     Component Value Date/Time   NA 140 01/27/2017 1620   K 3.8 01/27/2017 1620   CL 102 01/27/2017 1620   CO2 31 01/27/2017 1620   GLUCOSE 81 01/27/2017 1620   BUN 18 01/27/2017 1620   CREATININE 0.97 01/27/2017 1620   CALCIUM 8.7 01/27/2017 1620   PROT 8.0 03/01/2015 1429   ALBUMIN 3.6 03/01/2015 1429   AST 14  03/01/2015 1429   ALT 17 03/01/2015 1429   ALKPHOS 68 03/01/2015 1429   BILITOT 0.3 03/01/2015 1429   GFRNONAA 56 (L) 01/19/2017 0502   GFRNONAA >89 03/01/2015 1429   GFRAA >60 01/19/2017 0502   GFRAA >89 03/01/2015 1429       Component Value Date/Time   WBC 5.8 01/27/2017 1620   WBC 5.2 01/18/2017 0713   WBC 5.6 01/15/2017 2358   HGB 11.5 (L) 01/27/2017 1620   HGB 13.8 01/18/2017 0713   HGB 11.4 (L) 01/15/2017 2358   HCT 37.1 01/27/2017 1620   HCT 42.5 01/18/2017 0713   HCT 35.4 (L) 01/15/2017 2358   MCV 87.1 01/27/2017 1620   MCV 88.7 01/18/2017 0713   MCV 88.1 01/15/2017 2358    Lipid Panel     Component Value Date/Time   CHOL 188 01/15/2017 1816   TRIG 130 01/15/2017 1816   HDL 45 01/15/2017 1816   CHOLHDL 4.2 01/15/2017 1816   VLDL 26 01/15/2017 1816   LDLCALC 117 (H) 01/15/2017 1816    ABG    Component Value Date/Time   TCO2 25 11/13/2014 1903     Lab Results  Component Value Date   TSH 2.699 01/15/2017   BNP (last 3 results)  Recent Labs  01/15/17 1325  BNP 622.0*    ProBNP (last 3 results) No results for input(s): PROBNP in the last 8760 hours.  Cardiac Panel (last 3 results) No results for input(s): CKTOTAL, CKMB, TROPONINI, RELINDX in the last 72 hours.  Iron/TIBC/Ferritin/ %Sat No results found for: IRON, TIBC, FERRITIN, IRONPCTSAT   EKG Orders placed or performed during the hospital encounter of 01/15/17  . ED EKG  . ED EKG  . EKG 12-Lead  . EKG 12-Lead  . 12 lead EKG  . 12 lead EKG  . EKG     Prior Assessment and Plan Problem List as of 01/29/2017 Reviewed: 01/28/2017  7:22 AM by Tula Nakayama, MD     Cardiovascular and Mediastinum   HTN (hypertension), malignant   Last Assessment & Plan 01/27/2017 Office Visit Written 01/28/2017  7:20 AM by Fayrene Helper, MD    Improved, not at goal, will defer to cardiology she sees this week DASH diet and commitment to daily physical activity for a minimum of 30 minutes discussed and  encouraged, as a part of hypertension management. The importance of attaining a healthy weight is also discussed.  BP/Weight 01/27/2017 01/20/2017 12/17/2016 08/27/2016  07/03/2016 06/22/8100 04/23/1024  Systolic BP 852 778 242 353 614 431 540  Diastolic BP 92 61 94 086 84 100 96  Wt. (Lbs) 213 207.25 215.12 222 220 225 227  BMI 38.96 37.91 39.35 40.6 40.24 41.14 41.51           CHF exacerbation (HCC)   Acute combined systolic and diastolic congestive heart failure [facility])   Last Assessment & Plan 01/27/2017 Office Visit Written 01/28/2017  7:21 AM by Fayrene Helper, MD    New onset requiring 5 day hospital stay , will need cath as out pt        Respiratory   Seasonal allergies   Last Assessment & Plan 12/17/2016 Office Visit Written 12/21/2016 12:28 PM by Fayrene Helper, MD    Increased and uncontrolled resulting in chronic cough and wheeze, needs to commit to daily medication      Sleep apnea   Last Assessment & Plan 12/17/2016 Office Visit Written 12/17/2016 11:33 AM by Fayrene Helper, MD    Snoring , excessive fatigue and daytime sleepiness, needs sleep study and treatment        Digestive   DYSPEPSIA     Endocrine   Solitary thyroid nodule   Last Assessment & Plan 03/20/2016 Office Visit Written 03/20/2016  2:25 PM by Fayrene Helper, MD    Followed by eNT, rept TSH needed        Other   Morbid obesity   Last Assessment & Plan 12/17/2016 Office Visit Written 12/21/2016 12:28 PM by Fayrene Helper, MD    Improved. Patient re-educated about  the importance of commitment to a  minimum of 150 minutes of exercise per week.  The importance of healthy food choices with portion control discussed. Encouraged to start a food diary, count calories and to consider  joining a support group. Sample diet sheets offered. Goals set by the patient for the next several months.   Weight /BMI 12/17/2016 08/27/2016 07/03/2016  WEIGHT 215 lb 1.9 oz 222 lb 220 lb  HEIGHT 5\' 2"  5\' 2"  5\' 2"    BMI 39.35 kg/m2 40.6 kg/m2 40.24 kg/m2          Chest pain   Last Assessment & Plan 04/22/2011 Office Visit Written 04/22/2011  9:16 AM by Satira Sark, MD    As outlined above, with some features typical for angina in the setting of hypertension, obesity, reported glucose intolerance, and family history of premature coronary artery disease. Baseline ECG shows LVH. She just recently established for regular medical followup, back on medications for blood pressure. Seems to be more motivated to lose weight and start exercising. After discussing the matter, plan is to proceed with an exercise Myoview for objective ischemic evaluation. Followup will be arranged.      Heart murmur   Last Assessment & Plan 04/22/2011 Office Visit Written 04/22/2011  9:18 AM by Satira Sark, MD    Likely benign. In light of her symptoms, 2-D echocardiogram to be obtained for assessment of cardiac structure and function.      Prediabetes   Last Assessment & Plan 08/27/2016 Office Visit Written 08/31/2016  5:27 PM by Fayrene Helper, MD    Patient educated about the importance of limiting  Carbohydrate intake , the need to commit to daily physical activity for a minimum of 30 minutes , and to commit weight loss. The fact that changes in all these areas will reduce or eliminate all together the development of diabetes  is stressed.  Updated lab needed at/ before next visit.   Diabetic Labs Latest Ref Rng & Units 03/20/2016 03/01/2015 11/13/2014 03/07/2014 01/26/2012  HbA1c <5.7 % - 5.4 - 5.3 5.7(H)  Chol 0 - 200 mg/dL - 182 - 177 176  HDL >=46 mg/dL - 49 - 50 54  Calc LDL 0 - 99 mg/dL - 118(H) - 112(H) 110(H)  Triglycerides <150 mg/dL - 75 - 73 58  Creatinine 0.50 - 1.05 mg/dL 0.75 0.67 0.70 0.91 0.66   BP/Weight 08/27/2016 07/03/2016 03/20/2016 11/21/2015 08/30/2015 08/06/2015 1/61/0960  Systolic BP 454 098 119 147 829 562 130  Diastolic BP 865 84 784 96 81 100 104  Wt. (Lbs) 222 220 225 227 - - 224  BMI 40.6 40.24  41.14 41.51 - - 40.96   No flowsheet data found.        Hyperlipidemia LDL goal <100   Last Assessment & Plan 08/27/2016 Office Visit Written 08/31/2016  5:27 PM by Fayrene Helper, MD    Hyperlipidemia:Low fat diet discussed and encouraged.   Lipid Panel  Lab Results  Component Value Date   CHOL 182 03/01/2015   HDL 49 03/01/2015   LDLCALC 118 (H) 03/01/2015   TRIG 75 03/01/2015   CHOLHDL 3.7 03/01/2015   Updated lab needed at/ before next visit.         Metabolic syndrome X   Last Assessment & Plan 07/04/2015 Office Visit Written 07/09/2015  5:13 AM by Fayrene Helper, MD    The increased risk of cardiovascular disease associated with this diagnosis, and the need to consistently work on lifestyle to change this is discussed. Following  a  heart healthy diet ,commitment to 30 minutes of exercise at least 5 days per week, as well as control of blood sugar and cholesterol , and achieving a healthy weight are all the areas to be addressed .       Non-compliant behavior   Last Assessment & Plan 12/17/2016 Office Visit Written 12/21/2016 12:29 PM by Fayrene Helper, MD    Ongoing challenge which is being addressed      Knee pain, right   Last Assessment & Plan 03/01/2015 Office Visit Edited 03/03/2015  9:21 PM by Fayrene Helper, MD    Right knee pain and buckling progressively worsening in past 6 months, present for over 3 , needs ortho eval, referral entered  Weight loss advised Tylenol for pain      Hospital discharge follow-up   Last Assessment & Plan 01/27/2017 Office Visit Edited 01/28/2017  7:19 AM by Fayrene Helper, MD    Hospitalized for acute CHF with new abnormalities on echocardiogram,cath planned. 7 pound weight gain since d/c las t week, educated re fluid and sodium restriction , needs scale for daily weight            Imaging: Dg Chest 2 View  Result Date: 01/15/2017 CLINICAL DATA:  Shortness of Breath EXAM: CHEST  2 VIEW COMPARISON:   12/17/2016 FINDINGS: Cardiac shadow is at the upper limits of normal in size but stable. Lungs are well aerated bilaterally. Mild increase in vascular congestion is noted without interstitial edema. No focal infiltrate or effusion is seen. No acute bony abnormality is noted. IMPRESSION: Mild vascular congestion without interstitial edema. Electronically Signed   By: Inez Catalina M.D.   On: 01/15/2017 12:08

## 2017-01-29 NOTE — Telephone Encounter (Signed)
Michelle David is calling in regards to a Rx for a scale, please advise?

## 2017-01-29 NOTE — Telephone Encounter (Signed)
Was told by Velna Hatchet that [employer] would not pay for Scales and she stated she would buy her own

## 2017-01-29 NOTE — Progress Notes (Signed)
Cardiology Office Note   Date:  01/29/2017   ID:  [name], [name] [birth year], MRN [id number]  PCP:  Tula Nakayama, MD  Cardiologist:  [place]/ Jory Sims, NP   No chief complaint on file.     History of Present Illness: Michelle David is a 56 y.o. female who presents for  posthospitalization follow-up after admission for worsening shortness of breath, lower extremity edema, hypertension, with history of obesity and family history of CAD without symptoms of angina. She was diagnosed with acute systolic diastolic heart failure with echocardiogram completed during hospitalization revealing an EF of 30-35% with diffuse hypokinesis, grade 2 diastolic dysfunction, mild AR, and MR.   She was diuresed with IV Lasix and sent home on 40 mg of Lasix daily, she was continued on aspirin and lisinopril carvedilol and statin. For blood pressure control the patient's triamterene HCTZ was discontinued with institution of carvedilol and ACE.  She comes today feeling very tired, weak, continues to struggle with low sodium diet, has been medically compliant. She remains more or less sedentary. 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 She denies chest pain and dizziness fluid retention or dyspnea.  Past Medical History:  Diagnosis Date  . Dyslipidemia   . Dyspepsia   . Hypertensive cardiovascular disease    LVH by echo, CHF March 2018  . Impaired glucose tolerance   . Obesity     Past Surgical History:  Procedure Laterality Date  . OOPHORECTOMY    . TUBAL LIGATION       Current Outpatient Prescriptions  Medication Sig Dispense Refill  . aspirin EC 81 MG EC tablet Take 1 tablet (81 mg total) by mouth daily. 30 tablet 0  . atorvastatin (LIPITOR) 40 MG tablet Take 1 tablet (40 mg total) by mouth daily at 6 PM. 30 tablet 0  . azelastine (ASTELIN) 0.1 % nasal spray Place 2  sprays into both nostrils 2 (two) times daily. Use in each nostril as directed 30 mL 12  . furosemide (LASIX) 40 MG tablet Take 1 tablet (40 mg total) by mouth daily. 30 tablet 0  . lisinopril (PRINIVIL,ZESTRIL) 10 MG tablet Take 1 tablet (10 mg total) by mouth daily. 30 tablet 0  . montelukast (SINGULAIR) 10 MG tablet Take 1 tablet (10 mg total) by mouth at bedtime. 30 tablet 3  . carvedilol (COREG) 6.25 MG tablet Take 1 tablet (6.25 mg total) by mouth 2 (two) times daily. 180 tablet 3   No current facility-administered medications for this visit.     Allergies:   Patient has no known allergies.    Social History:  The patient  reports that she has never smoked. She has never used smokeless tobacco. She reports that she does not drink alcohol or use drugs.   Family History:  The patient's family history includes Heart disease in her mother; Hypertension in her mother.    ROS: All other systems are reviewed and negative. Unless otherwise mentioned in H&P    PHYSICAL EXAM: VS:  BP (!) 150/90   Pulse 98   Ht 5\' 2"  (1.575 m)   Wt 209 lb (94.8 kg)   SpO2 96%   BMI 38.23 kg/m  , BMI Body mass index is 38.23 kg/m. GEN: Well nourished, well developed, in no acute distress  HEENT: normal  Neck: no JVD, carotid bruits, or masses Cardiac: RRR; 1/6  systolic  murmurs, rubs, or gallops,no edema  Respiratory:  clear to auscultation bilaterally, normal work of breathing GI: soft, nontender, nondistended, + BS MS: no deformity or atrophy  Skin: warm and dry, no rash Neuro:  Strength and sensation are intact Psych: euthymic mood, full affect  Recent Labs: 01/15/2017: B Natriuretic Peptide 622.0; TSH 2.699 01/18/2017: Magnesium 2.1 01/27/2017: BUN 18; Creat 0.97; Hemoglobin 11.5; Platelets 385; Potassium 3.8; Sodium 140    Lipid Panel    Component Value Date/Time   CHOL 188 01/15/2017 1816   TRIG 130 01/15/2017 1816   HDL 45 01/15/2017 1816   CHOLHDL 4.2 01/15/2017 1816   VLDL 26  01/15/2017 1816   LDLCALC 117 (H) 01/15/2017 1816      Wt Readings from Last 3 Encounters:  01/29/17 209 lb (94.8 kg)  01/27/17 213 lb (96.6 kg)  01/20/17 207 lb 4 oz (94 kg)      Other studies Reviewed: Echocardiogram 01-31-2017 Left ventricle: The cavity size was normal. Wall thickness was   increased increased in a pattern of mild to moderate LVH.   Systolic function was moderately to severely reduced. The   estimated ejection fraction was in the range of 30% to 35%.   Diffuse hypokinesis. Features are consistent with a pseudonormal   left ventricular filling pattern, with concomitant abnormal   relaxation and increased filling pressure (grade 2 diastolic   dysfunction). Doppler parameters are consistent with high   ventricular filling pressure. - Aortic valve: There was mild regurgitation. Valve area (VTI):   1.78 cm^2. Valve area (Vmax): 1.77 cm^2. - Mitral valve: There was mild regurgitation. - Left atrium: The atrium was mildly dilated. - Technically difficult study. Echocontrast was used to enhance   visualization.  ASSESSMENT AND PLAN:  1.  Acute on chronic combined heart failure:  The patient has maintained her weight but continues to struggle with low-sodium diet. Blood pressure is elevated this visit I did take it again and found it to be similar to that which was taken when she first arrived. Heart rate is not well controlled. Will increase carvedilol to 6.25 mg twice a day. Continue ACE inhibitor 10 mg daily, consider adding Entresto on next office visit she will continue Lasix daily with daily weights. I have counseled her on weight gain and low sodium diet    I have encouraged her to begin to be more active, walk 15 minutes a day and slowly increase after 2 weeks to 20 minutes a day. This will help her with her energy level weight loss and hopefully her mood.  2. Hypertension: Elevated as discussed above. Increased dose of carvedilol. Repeat BMET prior to next  office visit  3. Hyperlipidemia: Continue statin therapy. Will need follow-up labs in 6 weeks   Current medicines are reviewed at length with the patient today.    Labs/ tests ordered today include:   Orders Placed This Encounter  Procedures  . Basic Metabolic Panel (BMET)     Disposition:   FU with One month  Signed, Jory Sims, NP  01/29/2017 4:59 PM    [facility] [address] [place], [place] [address] Phone: [phone]; Fax: [phone]

## 2017-01-29 NOTE — Patient Instructions (Addendum)
Your physician recommends that you schedule a follow-up appointment in: 1 months with Arnold Long NP    Please get lab work JUST BEFORE next visit : BMET    INCREASE Coreg to 6.25 mg twice a day    Weigh self daily every morning, after emptying your bladder, and without clothes on. Record on Weight Log  If you gain more than 2-3 lbs over night, OR over 5 lbs in 1 week, call our office for instructions   You have been given a low sodium diet to follow   Begin walking 15 minutes every day for 2 weeks.After 2 weeks, you may add 5 minutes per day to your time walked.        Thank you for choosing [facility] !

## 2017-01-31 LAB — IRON,TIBC AND FERRITIN PANEL
%SAT: 11 % (ref 11–50)
FERRITIN: 62 ng/mL (ref 10–232)
IRON: 37 ug/dL — AB (ref 45–160)
TIBC: 328 ug/dL (ref 250–450)

## 2017-01-31 LAB — B12 AND FOLATE PANEL
Folate: 6.7 ng/mL (ref >5.4)
Vitamin B-12: 731 pg/mL (ref 200–1100)

## 2017-02-21 ENCOUNTER — Other Ambulatory Visit: Payer: Self-pay | Admitting: Adult Health

## 2017-02-23 ENCOUNTER — Other Ambulatory Visit: Payer: Self-pay

## 2017-02-23 ENCOUNTER — Telehealth: Payer: Self-pay | Admitting: Family Medicine

## 2017-02-23 NOTE — Telephone Encounter (Signed)
Patient requesting refill  Call in @ [facility]

## 2017-02-24 NOTE — Telephone Encounter (Signed)
Patient advised that all requested meds were refilled on 5/7 by dr Rica Koyanagi

## 2017-03-02 ENCOUNTER — Encounter: Payer: Self-pay | Admitting: Adult Health

## 2017-03-02 ENCOUNTER — Ambulatory Visit (INDEPENDENT_AMBULATORY_CARE_PROVIDER_SITE_OTHER): Payer: BLUE CROSS/BLUE SHIELD | Admitting: Adult Health

## 2017-03-02 VITALS — BP 150/82 | HR 78 | Ht 62.0 in | Wt 208.0 lb

## 2017-03-02 DIAGNOSIS — E7800 Pure hypercholesterolemia, unspecified: Secondary | ICD-10-CM | POA: Diagnosis not present

## 2017-03-02 DIAGNOSIS — I10 Essential (primary) hypertension: Secondary | ICD-10-CM

## 2017-03-02 DIAGNOSIS — I5042 Chronic combined systolic (congestive) and diastolic (congestive) heart failure: Secondary | ICD-10-CM

## 2017-03-02 MED ORDER — SACUBITRIL-VALSARTAN 24-26 MG PO TABS: 1.0000 | ORAL_TABLET | Freq: Two times a day (BID) | ORAL | 6 refills | Status: DC

## 2017-03-02 NOTE — Patient Instructions (Addendum)
Your physician recommends that you schedule a follow-up appointment in: 1 Week for Blood pressure check.  Your physician recommends that you schedule a follow-up appointment in: 1 Month   Your physician recommends that you continue on your current medications as directed. Please refer to the Current Medication list given to you today.  STOP Taking Lisinopril  You have been given samples of Entresto 24mg /26mg   If you need a refill on your cardiac medications before your next appointment, please call your pharmacy.  Thank you for choosing [facility]!    DASH Eating Plan DASH stands for "Dietary Approaches to Stop Hypertension." The DASH eating plan is a healthy eating plan that has been shown to reduce high blood pressure (hypertension). It may also reduce your risk for type 2 diabetes, heart disease, and stroke. The DASH eating plan may also help with weight loss. What are tips for following this plan? General guidelines   Avoid eating more than 2,300 mg (milligrams) of salt (sodium) a day. If you have hypertension, you may need to reduce your sodium intake to 1,500 mg a day.  Limit alcohol intake to no more than 1 drink a day for nonpregnant women and 2 drinks a day for men. One drink equals 12 oz of beer, 5 oz of wine, or 1 oz of hard liquor.  Work with your health care provider to maintain a healthy body weight or to lose weight. Ask what an ideal weight is for you.  Get at least 30 minutes of exercise that causes your heart to beat faster (aerobic exercise) most days of the week. Activities may include walking, swimming, or biking.  Work with your health care provider or diet and nutrition specialist (dietitian) to adjust your eating plan to your individual calorie needs. Reading food labels   Check food labels for the amount of sodium per serving. Choose foods with less than 5 percent of the Daily Value of sodium. Generally, foods with less than 300 mg of sodium per  serving fit into this eating plan.  To find whole grains, look for the word "whole" as the first word in the ingredient list. Shopping   Buy products labeled as "low-sodium" or "no salt added."  Buy fresh foods. Avoid canned foods and premade or frozen meals. Cooking   Avoid adding salt when cooking. Use salt-free seasonings or herbs instead of table salt or sea salt. Check with your health care provider or pharmacist before using salt substitutes.  Do not fry foods. Cook foods using healthy methods such as baking, boiling, grilling, and broiling instead.  Cook with heart-healthy oils, such as olive, canola, soybean, or sunflower oil. Meal planning    Eat a balanced diet that includes:  5 or more servings of fruits and vegetables each day. At each meal, try to fill half of your plate with fruits and vegetables.  Up to 6-8 servings of whole grains each day.  Less than 6 oz of lean meat, poultry, or fish each day. A 3-oz serving of meat is about the same size as a deck of cards. One egg equals 1 oz.  2 servings of low-fat dairy each day.  A serving of nuts, seeds, or beans 5 times each week.  Heart-healthy fats. Healthy fats called Omega-3 fatty acids are found in foods such as flaxseeds and coldwater fish, like sardines, salmon, and mackerel.  Limit how much you eat of the following:  Canned or prepackaged foods.  Food that is high in trans  fat, such as fried foods.  Food that is high in saturated fat, such as fatty meat.  Sweets, desserts, sugary drinks, and other foods with added sugar.  Full-fat dairy products.  Do not salt foods before eating.  Try to eat at least 2 vegetarian meals each week.  Eat more home-cooked food and less restaurant, buffet, and fast food.  When eating at a restaurant, ask that your food be prepared with less salt or no salt, if possible. What foods are recommended? The items listed may not be a complete list. Talk with your dietitian  about what dietary choices are best for you. Grains  Whole-grain or whole-wheat bread. Whole-grain or whole-wheat pasta. Brown rice. [place]ena Morrow. Bulgur. Whole-grain and low-sodium cereals. Pita bread. Low-fat, low-sodium crackers. Whole-wheat flour tortillas. Vegetables  Fresh or frozen vegetables (raw, steamed, roasted, or grilled). Low-sodium or reduced-sodium tomato and vegetable juice. Low-sodium or reduced-sodium tomato sauce and tomato paste. Low-sodium or reduced-sodium canned vegetables. Fruits  All fresh, dried, or frozen fruit. Canned fruit in natural juice (without added sugar). Meat and other protein foods  Skinless chicken or [place]. Ground chicken or [place]. Pork with fat trimmed off. Fish and seafood. Egg whites. Dried beans, peas, or lentils. Unsalted nuts, nut butters, and seeds. Unsalted canned beans. Lean cuts of beef with fat trimmed off. Low-sodium, lean deli meat. Dairy  Low-fat (1%) or fat-free (skim) milk. Fat-free, low-fat, or reduced-fat cheeses. Nonfat, low-sodium ricotta or cottage cheese. Low-fat or nonfat yogurt. Low-fat, low-sodium cheese. Fats and oils  Soft margarine without trans fats. Vegetable oil. Low-fat, reduced-fat, or light mayonnaise and salad dressings (reduced-sodium). Canola, safflower, olive, soybean, and sunflower oils. Avocado. Seasoning and other foods  Herbs. Spices. Seasoning mixes without salt. Unsalted popcorn and pretzels. Fat-free sweets. What foods are not recommended? The items listed may not be a complete list. Talk with your dietitian about what dietary choices are best for you. Grains  Baked goods made with fat, such as croissants, muffins, or some breads. Dry pasta or rice meal packs. Vegetables  Creamed or fried vegetables. Vegetables in a cheese sauce. Regular canned vegetables (not low-sodium or reduced-sodium). Regular canned tomato sauce and paste (not low-sodium or reduced-sodium). Regular tomato and vegetable juice (not  low-sodium or reduced-sodium). Angie Fava. Olives. Fruits  Canned fruit in a light or heavy syrup. Fried fruit. Fruit in cream or butter sauce. Meat and other protein foods  Fatty cuts of meat. Ribs. Fried meat. Berniece Salines. Sausage. Bologna and other processed lunch meats. Salami. Fatback. Hotdogs. Bratwurst. Salted nuts and seeds. Canned beans with added salt. Canned or smoked fish. Whole eggs or egg yolks. Chicken or [place] with skin. Dairy  Whole or 2% milk, cream, and half-and-half. Whole or full-fat cream cheese. Whole-fat or sweetened yogurt. Full-fat cheese. Nondairy creamers. Whipped toppings. Processed cheese and cheese spreads. Fats and oils  Butter. Stick margarine. Lard. Shortening. Ghee. Bacon fat. Tropical oils, such as coconut, palm kernel, or palm oil. Seasoning and other foods  Salted popcorn and pretzels. Onion salt, garlic salt, seasoned salt, table salt, and sea salt. Worcestershire sauce. Tartar sauce. Barbecue sauce. Teriyaki sauce. Soy sauce, including reduced-sodium. Steak sauce. Canned and packaged gravies. Fish sauce. Oyster sauce. Cocktail sauce. Horseradish that you find on the shelf. Ketchup. Mustard. Meat flavorings and tenderizers. Bouillon cubes. Hot sauce and Tabasco sauce. Premade or packaged marinades. Premade or packaged taco seasonings. Relishes. Regular salad dressings. Where to find more information:  National Heart, Lung, and [place]: https://wilson-eaton.com/  American Heart Association:  www.heart.org Summary  The DASH eating plan is a healthy eating plan that has been shown to reduce high blood pressure (hypertension). It may also reduce your risk for type 2 diabetes, heart disease, and stroke.  With the DASH eating plan, you should limit salt (sodium) intake to 2,300 mg a day. If you have hypertension, you may need to reduce your sodium intake to 1,500 mg a day.  When on the DASH eating plan, aim to eat more fresh fruits and vegetables, whole grains, lean  proteins, low-fat dairy, and heart-healthy fats.  Work with your health care provider or diet and nutrition specialist (dietitian) to adjust your eating plan to your individual calorie needs. This information is not intended to replace advice given to you by your health care provider. Make sure you discuss any questions you have with your health care provider. Document Released: 09/25/2011 Document Revised: 09/29/2016 Document Reviewed: 09/29/2016 Elsevier Interactive Patient Education  2017 [employer].

## 2017-03-02 NOTE — Progress Notes (Signed)
Cardiology Office Note   Date:  03/02/2017   ID:  Terran, Hollenkamp October 19, 1961, MRN [id number]  PCP:  Fayrene Helper, MD  Cardiologist: [place]/   Jory Sims, NP   Chief Complaint  Patient presents with  . Congestive Heart Failure  . Hypertension      History of Present Illness: Michelle David is a 56 y.o. female who presents for ongoing assessment and management of chronic mixed systolic and diastolic CHF EF of 35-32%, , hypertension. On last office visit on 01/29/2017 she was feeling tired and weak, She continued to struggle with low sodium diet. BP was elevated and therefore coreg was increased to 6.25 mg BID. No other medications were changed. Consideration for Entresto on this visit if she is feeling better. BMET was ordered.   Na: 140, K+ 3.8, Cl 102, BUN 18; Cr: 0.97  Hgb 11.5; Hct 37.1.   She comes today not feeling any better. She states that she climbed a hill recently and had to sit down several times before getting to the top. She is still not adhering to low salt diet.  Blood pressure is unchanged despite the increased dose of coreg.   Past Medical History:  Diagnosis Date  . Dyslipidemia   . Dyspepsia   . Hypertensive cardiovascular disease    LVH by echo, CHF March 2018  . Impaired glucose tolerance   . Obesity     Past Surgical History:  Procedure Laterality Date  . OOPHORECTOMY    . TUBAL LIGATION       Current Outpatient Prescriptions  Medication Sig Dispense Refill  . ASPIRIN LOW DOSE 81 MG EC tablet TAKE 1 TABLET BY MOUTH DAILY 30 tablet 0  . atorvastatin (LIPITOR) 40 MG tablet TAKE 1 TABLET BY MOUTH DAILY AT 6PM 30 tablet 0  . azelastine (ASTELIN) 0.1 % nasal spray Place 2 sprays into both nostrils 2 (two) times daily. Use in each nostril as directed 30 mL 12  . carvedilol (COREG) 6.25 MG tablet Take 1 tablet (6.25 mg total) by mouth 2 (two) times daily. 180 tablet 3  . furosemide (LASIX) 40 MG tablet TAKE 1 TABLET BY MOUTH DAILY  30 tablet 0  . lisinopril (PRINIVIL,ZESTRIL) 10 MG tablet TAKE 1 TABLET BY MOUTH DAILY 30 tablet 0   No current facility-administered medications for this visit.     Allergies:   Patient has no known allergies.    Social History:  The patient  reports that she has never smoked. She has never used smokeless tobacco. She reports that she does not drink alcohol or use drugs.   Family History:  The patient's family history includes Heart disease in her mother; Hypertension in her mother.    ROS: All other systems are reviewed and negative. Unless otherwise mentioned in H&P    PHYSICAL EXAM: VS:  BP (!) 150/82   Pulse 78   Ht 5\' 2"  (1.575 m)   Wt 208 lb (94.3 kg)   SpO2 99%   BMI 38.04 kg/m  , BMI Body mass index is 38.04 kg/m. GEN: Well nourished, well developed, in no acute distress  Obese HEENT: normal  Neck: no JVD, carotid bruits, or masses Cardiac: RRR; no murmurs, rubs, or gallops,no edema  Respiratory:  clear to auscultation bilaterally, normal work of breathing GI: soft, nontender, nondistended, + BS MS: no deformity or atrophy  Skin: warm and dry, no rash Neuro:  Strength and sensation are intact Psych: euthymic mood, full affect  Recent Labs: 01/15/2017: B Natriuretic Peptide 622.0; TSH 2.699 01/18/2017: Magnesium 2.1 01/27/2017: BUN 18; Creat 0.97; Hemoglobin 11.5; Platelets 385; Potassium 3.8; Sodium 140    Lipid Panel    Component Value Date/Time   CHOL 188 01/15/2017 1816   TRIG 130 01/15/2017 1816   HDL 45 01/15/2017 1816   CHOLHDL 4.2 01/15/2017 1816   VLDL 26 01/15/2017 1816   LDLCALC 117 (H) 01/15/2017 1816      Wt Readings from Last 3 Encounters:  03/02/17 208 lb (94.3 kg)  01/29/17 209 lb (94.8 kg)  01/27/17 213 lb (96.6 kg)      Other studies Reviewed: Echocardiogram 01/25/2017 Left ventricle: The cavity size was normal. Wall thickness was   increased increased in a pattern of mild to moderate LVH.   Systolic function was moderately to  severely reduced. The   estimated ejection fraction was in the range of 30% to 35%.   Diffuse hypokinesis. Features are consistent with a pseudonormal   left ventricular filling pattern, with concomitant abnormal   relaxation and increased filling pressure (grade 2 diastolic   dysfunction). Doppler parameters are consistent with high   ventricular filling pressure. - Aortic valve: There was mild regurgitation. Valve area (VTI):   1.78 cm^2. Valve area (Vmax): 1.77 cm^2. - Mitral valve: There was mild regurgitation. - Left atrium: The atrium was mildly dilated. - Technically difficult study. Echocontrast was used to enhance   visualization.  ASSESSMENT AND PLAN:  1. Chronic mixed CHF:  Wt is essentially unchanged. She is medically compliant but is still eating salty foods. I will give her a copy of a DASH diet and low sodium diet. She is not to use Kosher salt or sea salt. I will begin Entresto 24 mg/26 mg BID. She will com back for BP check in one week. I have given her a BP log to record home BP's as well. She is to stop the lisinopril and only take Entresto. Samples are provided   2. Hypertension: BP checks at home. Adding Entresto. May need to increase coreg on next visit.    3. Hypercholesterolemia. Continue statin   Current medicines are reviewed at length with the patient today.    Labs/ tests ordered today include:  Phill Myron. West Pugh, ANP, AACC   03/02/2017 2:16 PM    [facility] [address], [place], [place] [address] Phone: [phone]; Fax: [phone]

## 2017-03-09 ENCOUNTER — Ambulatory Visit: Payer: BLUE CROSS/BLUE SHIELD

## 2017-03-26 ENCOUNTER — Emergency Department (HOSPITAL_COMMUNITY)
Admission: EM | Admit: 2017-03-26 | Discharge: 2017-03-26 | Disposition: A | Discharge status: Home / Self Care | Payer: BLUE CROSS/BLUE SHIELD | Attending: Emergency Medicine | Admitting: Emergency Medicine

## 2017-03-26 ENCOUNTER — Encounter (HOSPITAL_COMMUNITY): Payer: Self-pay

## 2017-03-26 ENCOUNTER — Emergency Department (HOSPITAL_COMMUNITY): Payer: BLUE CROSS/BLUE SHIELD

## 2017-03-26 ENCOUNTER — Other Ambulatory Visit: Payer: Self-pay | Admitting: Adult Health

## 2017-03-26 ENCOUNTER — Other Ambulatory Visit: Payer: Self-pay

## 2017-03-26 DIAGNOSIS — Z79899 Other long term (current) drug therapy: Secondary | ICD-10-CM | POA: Insufficient documentation

## 2017-03-26 DIAGNOSIS — I5023 Acute on chronic systolic (congestive) heart failure: Secondary | ICD-10-CM

## 2017-03-26 DIAGNOSIS — R059 Cough, unspecified: Secondary | ICD-10-CM

## 2017-03-26 DIAGNOSIS — I110 Hypertensive heart disease with heart failure: Secondary | ICD-10-CM | POA: Diagnosis not present

## 2017-03-26 DIAGNOSIS — R05 Cough: Secondary | ICD-10-CM

## 2017-03-26 DIAGNOSIS — Z7982 Long term (current) use of aspirin: Secondary | ICD-10-CM | POA: Insufficient documentation

## 2017-03-26 DIAGNOSIS — R0602 Shortness of breath: Secondary | ICD-10-CM | POA: Diagnosis present

## 2017-03-26 HISTORY — DX: Heart failure, unspecified: I50.9

## 2017-03-26 LAB — CBC WITH DIFFERENTIAL/PLATELET
BASOS ABS: 0 10*3/uL (ref 0.0–0.1)
Basophils Relative: 0 %
Eosinophils Absolute: 0.3 10*3/uL (ref 0.0–0.7)
Eosinophils Relative: 5 %
HEMATOCRIT: 36.7 % (ref 36.0–46.0)
HEMOGLOBIN: 11.7 g/dL — AB (ref 12.0–15.0)
LYMPHS PCT: 31 %
Lymphs Abs: 2 10*3/uL (ref 0.7–4.0)
MCH: 27.9 pg (ref 26.0–34.0)
MCHC: 31.9 g/dL (ref 30.0–36.0)
MCV: 87.4 fL (ref 78.0–100.0)
Monocytes Absolute: 0.4 10*3/uL (ref 0.1–1.0)
Monocytes Relative: 6 %
NEUTROS ABS: 3.7 10*3/uL (ref 1.7–7.7)
Neutrophils Relative %: 58 %
Platelets: 343 10*3/uL (ref 150–400)
RBC: 4.2 MIL/uL (ref 3.87–5.11)
RDW: 15.4 % (ref 11.5–15.5)
WBC: 6.4 10*3/uL (ref 4.0–10.5)

## 2017-03-26 LAB — BASIC METABOLIC PANEL
ANION GAP: 7 (ref 5–15)
BUN: 17 mg/dL (ref 6–20)
CO2: 29 mmol/L (ref 22–32)
Calcium: 8.7 mg/dL — ABNORMAL LOW (ref 8.9–10.3)
Chloride: 104 mmol/L (ref 101–111)
Creatinine, Ser: 0.89 mg/dL (ref 0.44–1.00)
GFR calc Af Amer: >60 mL/min (ref >60)
GLUCOSE: 94 mg/dL (ref 65–99)
POTASSIUM: 3.4 mmol/L — AB (ref 3.5–5.1)
Sodium: 140 mmol/L (ref 135–145)

## 2017-03-26 LAB — TROPONIN I: Troponin I: <0.03 ng/mL (ref <0.03)

## 2017-03-26 LAB — D-DIMER, QUANTITATIVE (NOT AT ARMC): D DIMER QUANT: 0.43 ug{FEU}/mL (ref 0.00–0.50)

## 2017-03-26 LAB — BRAIN NATRIURETIC PEPTIDE: B Natriuretic Peptide: 266 pg/mL — ABNORMAL HIGH (ref 0.0–100.0)

## 2017-03-26 MED ORDER — FUROSEMIDE 10 MG/ML IJ SOLN
40.0000 mg | Freq: Once | INTRAMUSCULAR | Status: AC
  Administered 2017-03-26: 40 mg via INTRAVENOUS
  Filled 2017-03-26: qty 4

## 2017-03-26 MED ORDER — BENZONATATE 100 MG PO CAPS: 100.0000 mg | ORAL_CAPSULE | Freq: Three times a day (TID) | ORAL | 0 refills | Status: DC | PRN

## 2017-03-26 NOTE — ED Notes (Signed)
Pt ambulated in hallway with maintaining of sats 98-100%

## 2017-03-26 NOTE — ED Provider Notes (Addendum)
[facility] DEPT Provider Note   CSN: [id number] Arrival date & time: 03/26/17  0155     History   Chief Complaint Chief Complaint  Patient presents with  . Shortness of Breath    cough x 2 weeks    HPI Michelle David is a 56 y.o. female.  Patient with history of combined systolic and diastolic heart failure with EF of 35% presenting with 2 week history of cough productive of clear mucus. She reports some dyspnea with exertion and two-pillow orthopnea which is unchanged. Denies any PND. Denies any leg swelling. She is not certain whether she weight has been increased. States compliance with her low-salt diet and home diuretics. Denies chest pain or fever. Few episodes of posttussive emesis. No abdominal pain. Lisinopril was recently stopped and trust was started.   The history is provided by the patient.  Shortness of Breath  Associated symptoms include rhinorrhea and cough. Pertinent negatives include no fever, no headaches, no chest pain, no vomiting, no abdominal pain, no rash and no leg swelling.    Past Medical History:  Diagnosis Date  . CHF (congestive heart failure) ([place])   . Dyslipidemia   . Dyspepsia   . Hypertension   . Hypertensive cardiovascular disease    LVH by echo, CHF March 2018  . Impaired glucose tolerance   . Obesity     Patient Active Problem List   Diagnosis Date Noted  . Hospital discharge follow-up 01/28/2017  . Acute combined systolic and diastolic congestive heart failure ([place]) 01/17/2017  . CHF exacerbation ([place]) 01/15/2017  . Sleep apnea 12/17/2016  . Knee pain, right 03/01/2015  . Solitary thyroid nodule 03/01/2015  . Seasonal allergies 04/24/2014  . Prediabetes 05/29/2013  . Hyperlipidemia LDL goal <100 05/29/2013  . Metabolic syndrome X 36/14/4315  . Non-compliant behavior 05/29/2013  . Heart murmur 04/22/2011  . Chest pain 04/07/2011  . Morbid obesity 03/31/2008  . HTN (hypertension), malignant 03/31/2008  . DYSPEPSIA  03/31/2008    Past Surgical History:  Procedure Laterality Date  . OOPHORECTOMY    . TUBAL LIGATION      OB History    No data available       Home Medications    Prior to Admission medications   Medication Sig Start Date End Date Taking? Authorizing Provider  ASPIRIN LOW DOSE 81 MG EC tablet TAKE 1 TABLET BY MOUTH DAILY 02/23/17  Yes Lendon Colonel, NP  atorvastatin (LIPITOR) 40 MG tablet TAKE 1 TABLET BY MOUTH DAILY AT 6PM 02/23/17  Yes Lendon Colonel, NP  azelastine (ASTELIN) 0.1 % nasal spray Place 2 sprays into both nostrils 2 (two) times daily. Use in each nostril as directed 08/27/16  Yes Fayrene Helper, MD  carvedilol (COREG) 6.25 MG tablet Take 1 tablet (6.25 mg total) by mouth 2 (two) times daily. 01/29/17  Yes Lendon Colonel, NP  furosemide (LASIX) 40 MG tablet TAKE 1 TABLET BY MOUTH DAILY 02/23/17  Yes Lendon Colonel, NP  lisinopril (PRINIVIL,ZESTRIL) 10 MG tablet Take 10 mg by mouth daily.   Yes [provider]  sacubitril-valsartan (ENTRESTO) 24-26 MG Take 1 tablet by mouth 2 (two) times daily. 03/02/17  Yes Lendon Colonel, NP  triamterene-hydrochlorothiazide (MAXZIDE) 75-50 MG tablet Take 1 tablet by mouth daily.   Yes [provider]    Family History Family History  Problem Relation Age of Onset  . Heart disease Mother        Died in her 84s with  MI  . Hypertension Mother     Social History Social History  Substance Use Topics  . Smoking status: Never Smoker  . Smokeless tobacco: Never Used  . Alcohol use No     Allergies   Patient has no known allergies.   Review of Systems Review of Systems  Constitutional: Negative for activity change, appetite change and fever.  HENT: Positive for congestion and rhinorrhea.   Respiratory: Positive for cough and shortness of breath.   Cardiovascular: Negative for chest pain, palpitations and leg swelling.  Gastrointestinal: Negative for abdominal pain, nausea and  vomiting.  Genitourinary: Negative for dysuria, hematuria, vaginal bleeding and vaginal discharge.  Musculoskeletal: Negative for arthralgias and myalgias.  Skin: Negative for rash.  Neurological: Negative for dizziness, weakness, light-headedness and headaches.   all other systems are negative except as noted in the HPI and PMH.     Physical Exam Updated Vital Signs BP 139/75   Pulse 74   Temp 98.6 F (37 C) (Oral)   Resp 17   Ht 5\' 2"  (1.575 m)   Wt 90.9 kg (200 lb 6 oz)   SpO2 98%   BMI 36.65 kg/m   Physical Exam  Constitutional: She is oriented to person, place, and time. She appears well-developed and well-nourished. No distress.  Speaking in full sentences  HENT:  Head: Normocephalic and atraumatic.  Mouth/Throat: Oropharynx is clear and moist. No oropharyngeal exudate.  Nasal congestion  Eyes: Conjunctivae and EOM are normal. Pupils are equal, round, and reactive to light.  Neck: Normal range of motion. Neck supple.  No meningismus.  Cardiovascular: Normal rate, regular rhythm and intact distal pulses.   Murmur heard. Pulmonary/Chest: Effort normal and breath sounds normal. No respiratory distress. She exhibits no tenderness.  Abdominal: Soft. There is no tenderness. There is no rebound and no guarding.  Musculoskeletal: Normal range of motion. She exhibits no edema or tenderness.  Neurological: She is alert and oriented to person, place, and time. No cranial nerve deficit. She exhibits normal muscle tone. Coordination normal.   5/5 strength throughout. CN 2-12 intact.Equal grip strength.   Skin: Skin is warm.  Psychiatric: She has a normal mood and affect. Her behavior is normal.  Nursing note and vitals reviewed.    ED Treatments / Results  Labs (all labs ordered are listed, but only abnormal results are displayed) Labs Reviewed  CBC WITH DIFFERENTIAL/PLATELET - Abnormal; Notable for the following:       Result Value   Hemoglobin 11.7 (*)    All other  components within normal limits  BASIC METABOLIC PANEL - Abnormal; Notable for the following:    Potassium 3.4 (*)    Calcium 8.7 (*)    All other components within normal limits  BRAIN NATRIURETIC PEPTIDE - Abnormal; Notable for the following:    B Natriuretic Peptide 266.0 (*)    All other components within normal limits  TROPONIN I    EKG  EKG Interpretation None       Radiology Dg Chest 2 View  Result Date: 03/26/2017 CLINICAL DATA:  Cough for 2 weeks.  Shortness of breath. EXAM: CHEST  2 VIEW COMPARISON:  01/15/2017, 12/17/2016 FINDINGS: Borderline mild cardiomegaly is stable from prior exam. Again seen vascular congestion. Bronchial thickening which appears chronic. No consolidation, pleural fluid or pneumothorax. Degenerative change in the spine. IMPRESSION: Stable mild cardiomegaly and vascular congestion. Stable bronchial thickening. No change from prior exam. Electronically Signed   By: Jeb Levering M.D.   On:  03/26/2017 03:25    Procedures Procedures (including critical care time)  Medications Ordered in ED Medications  furosemide (LASIX) injection 40 mg (not administered)     Initial Impression / Assessment and Plan / ED Course  I have reviewed the triage vital signs and the nursing notes.  Pertinent labs & imaging results that were available during my care of the patient were reviewed by me and considered in my medical decision making (see chart for details).     Patient presents with 2 weeks of nonproductive cough. No chest pain. History of systolic and diastolic heart failure with EF of 35%. States compliance with medications.  She is in no distress. Initial tachycardia has resolved. Denies chest pain. EKG unchanged.  Lungs are clear to auscultation. Chest x-ray shows stable cardiomegaly and vascular congestion. IV Lasix given. BNP improved from previous. D-dimer is negative. Low suspicion for ACS. Low suspicion for pulmonary embolism.  Patient  ambulatory without desaturation. No chest pain.  Discussed with patient to increase her Lasix dose for the next 2 days and then return to her 40 mg once daily. She has cardiology follow-up later this week. Return precautions discussed including chest pain, exertional shortness of breath or any other concerns.  BP (!) 143/70   Pulse 84   Temp 98.6 F (37 C) (Oral)   Resp 13   Ht 5\' 2"  (1.575 m)   Wt 90.9 kg (200 lb 6 oz)   SpO2 95%   BMI 36.65 kg/m   Final Clinical Impressions(s) / ED Diagnoses   Final diagnoses:  Acute on chronic systolic congestive heart failure (HCC)  Cough    New Prescriptions New Prescriptions   No medications on file     Ezequiel Essex, MD 03/26/17 7939    Ezequiel Essex, MD 03/26/17 [phone]

## 2017-03-26 NOTE — Discharge Instructions (Signed)
Increase your Lasix to 40 mg twice daily for the next 2 days then go back to 40 mg daily. Call your cardiologist tomorrow. Return to the ED if you develop worsening shortness of breath, chest pain or any other concerns.

## 2017-03-30 ENCOUNTER — Other Ambulatory Visit (HOSPITAL_COMMUNITY)
Admission: RE | Admit: 2017-03-30 | Discharge: 2017-03-30 | Disposition: A | Discharge status: Home / Self Care | Payer: BLUE CROSS/BLUE SHIELD | Source: Ambulatory Visit | Attending: Family Medicine | Admitting: Family Medicine

## 2017-03-30 ENCOUNTER — Ambulatory Visit (INDEPENDENT_AMBULATORY_CARE_PROVIDER_SITE_OTHER): Payer: BLUE CROSS/BLUE SHIELD | Admitting: Family Medicine

## 2017-03-30 ENCOUNTER — Encounter: Payer: Self-pay | Admitting: Family Medicine

## 2017-03-30 VITALS — BP 138/84 | HR 84 | Temp 98.0°F | Resp 18 | Ht 62.0 in | Wt 207.1 lb

## 2017-03-30 DIAGNOSIS — E8881 Metabolic syndrome: Secondary | ICD-10-CM

## 2017-03-30 DIAGNOSIS — Z1211 Encounter for screening for malignant neoplasm of colon: Secondary | ICD-10-CM | POA: Diagnosis not present

## 2017-03-30 DIAGNOSIS — E785 Hyperlipidemia, unspecified: Secondary | ICD-10-CM

## 2017-03-30 DIAGNOSIS — Z0000 Encounter for general adult medical examination without abnormal findings: Secondary | ICD-10-CM | POA: Diagnosis not present

## 2017-03-30 DIAGNOSIS — Z124 Encounter for screening for malignant neoplasm of cervix: Secondary | ICD-10-CM | POA: Insufficient documentation

## 2017-03-30 DIAGNOSIS — I5042 Chronic combined systolic (congestive) and diastolic (congestive) heart failure: Secondary | ICD-10-CM

## 2017-03-30 LAB — POC HEMOCCULT BLD/STL (OFFICE/1-CARD/DIAGNOSTIC): Fecal Occult Blood, POC: NEGATIVE

## 2017-03-30 MED ORDER — TRIAMTERENE-HCTZ 75-50 MG PO TABS: 1.0000 | ORAL_TABLET | Freq: Every day | ORAL | 1 refills | Status: DC

## 2017-03-30 MED ORDER — LISINOPRIL 10 MG PO TABS: 10.0000 mg | ORAL_TABLET | Freq: Every day | ORAL | 1 refills | Status: DC

## 2017-03-30 NOTE — Progress Notes (Signed)
    Michelle David     MRN: [id number]      DOB: 09-Sep-1961  HPI: Patient is in for annual physical exam. No other health concerns are expressed or addressed at the visit. Recent labs, if available are reviewed. Immunization is reviewed , and  updated if needed.   PE: Pleasant  female, alert and oriented x 3, in no cardio-pulmonary distress. Afebrile. HEENT No facial trauma or asymetry. Sinuses non tender.  Extra occullar muscles intact, pupils equally reactive to light. External ears normal, tympanic membranes clear. Oropharynx moist, no exudate. Neck: supple, no adenopathy,JVD or thyromegaly.No bruits.  Chest: Clear to ascultation bilaterally.No crackles or wheezes. Non tender to palpation  Breast: No asymetry,no masses or lumps. No tenderness. No nipple discharge or inversion. No axillary or supraclavicular adenopathy  Cardiovascular system; Heart sounds normal,  S1 and  S2 ,no S3.  No murmur, or thrill. Apical beat not displaced Peripheral pulses normal.  Abdomen: Soft, non tender, no organomegaly or masses. No bruits. Bowel sounds normal. No guarding, tenderness or rebound.  Rectal:  Normal sphincter tone. No rectal mass. Guaiac negative stool.  GU: External genitalia normal female genitalia , normal female distribution of hair. No lesions. Urethral meatus normal in size, no  Prolapse, no lesions visibly  Present. Bladder non tender. Vagina pink and moist , with no visible lesions , discharge present . Adequate pelvic support no  cystocele or rectocele noted Cervix pink and appears healthy, no lesions or ulcerations noted, no discharge noted from os Uterus normal size, no adnexal masses, no cervical motion or adnexal tenderness.   Musculoskeletal exam: Full ROM of spine, hips , shoulders and knees. No deformity ,swelling or crepitus noted. No muscle wasting or atrophy.   Neurologic: Cranial nerves 2 to 12 intact. Power, tone ,sensation and reflexes  normal throughout. No disturbance in gait. No tremor.  Skin: Intact, no ulceration, erythema , scaling or rash noted. Pigmentation normal throughout  Psych; Normal mood and affect. Judgement and concentration normal   Assessment & Plan:  Annual physical exam Annual exam as documented. Counseling done  re healthy lifestyle involving commitment to 150 minutes exercise per week, heart healthy diet, and attaining healthy weight.The importance of adequate sleep also discussed. Regular seat belt use and home safety, is also discussed. Changes in health habits are decided on by the patient with goals and time frames  set for achieving them. Immunization and cancer screening needs are specifically addressed at this visit.

## 2017-03-30 NOTE — Assessment & Plan Note (Signed)

## 2017-03-30 NOTE — Patient Instructions (Addendum)
Fg/u in 3.5 month, call if you need me before  Fasting lipid, cmp and eGFr  1 week before follow up  Please work on good  health habits so that your health will improve. 1. Commitment to daily physical activity for 30 to 60  minutes, if you are able to do this.  2. Commitment to wise food choices. Aim for half of your  food intake to be vegetable and fruit, one quarter starchy foods, and one quarter protein. Try to eat on a regular schedule  3 meals per day, snacking between meals should be limited to vegetables or fruits or small portions of nuts. 64 ounces of water per day is generally recommended, unless you have specific health conditions, like heart failure or kidney failure where you will need to limit fluid intake.  3. Commitment to sufficient and a  good quality of physical and mental rest daily, generally between 6 to 8 hours per day.  WITH PERSISTANCE AND PERSEVERANCE, THE IMPOSSIBLE , BECOMES THE NORM!  It is important that you exercise regularly at least 30 minutes 5 times a week. If you develop chest pain, have severe difficulty breathing, or feel very tired, stop exercising immediately and seek medical attention  Thank you  for choosing [facility]. We consider it a privelige to serve you.  Delivering excellent health care in a caring and  compassionate way is our goal.  Partnering with you,  so that together we can achieve this goal is our strategy.    You are referred for colonoscopy to Dr Marian Sorrow, you NEED this

## 2017-04-02 ENCOUNTER — Encounter: Payer: Self-pay | Admitting: Adult Health

## 2017-04-02 ENCOUNTER — Ambulatory Visit (INDEPENDENT_AMBULATORY_CARE_PROVIDER_SITE_OTHER): Payer: BLUE CROSS/BLUE SHIELD | Admitting: Adult Health

## 2017-04-02 VITALS — BP 140/88 | HR 103 | Ht 62.0 in | Wt 205.0 lb

## 2017-04-02 DIAGNOSIS — I10 Essential (primary) hypertension: Secondary | ICD-10-CM | POA: Diagnosis not present

## 2017-04-02 DIAGNOSIS — I5042 Chronic combined systolic (congestive) and diastolic (congestive) heart failure: Secondary | ICD-10-CM

## 2017-04-02 LAB — CYTOLOGY - PAP
DIAGNOSIS: NEGATIVE
HPV: NOT DETECTED

## 2017-04-02 MED ORDER — SACUBITRIL-VALSARTAN 49-51 MG PO TABS
1.0000 | ORAL_TABLET | Freq: Two times a day (BID) | ORAL | 11 refills | Status: DC
Start: 2017-04-02 — End: 2017-05-07

## 2017-04-02 MED ORDER — CARVEDILOL 6.25 MG PO TABS: 6.2500 mg | ORAL_TABLET | Freq: Two times a day (BID) | ORAL | 3 refills | Status: DC

## 2017-04-02 MED ORDER — ATORVASTATIN CALCIUM 40 MG PO TABS: ORAL_TABLET | ORAL | 3 refills | Status: DC

## 2017-04-02 MED ORDER — DM-GUAIFENESIN ER 30-600 MG PO TB12: 1.0000 | ORAL_TABLET | Freq: Two times a day (BID) | ORAL | 0 refills | Status: DC

## 2017-04-02 MED ORDER — FUROSEMIDE 40 MG PO TABS: 40.0000 mg | ORAL_TABLET | Freq: Every day | ORAL | 3 refills | Status: DC

## 2017-04-02 NOTE — Progress Notes (Signed)
Cardiology Office Note   Date:  04/02/2017   ID:  Liane, Tribbey 1961/10/03, MRN [id number]  PCP:  Fayrene Helper, MD  Cardiologist:  Domenic Polite  Chief Complaint  Patient presents with  . Congestive Heart Failure  . Hypertension      History of Present Illness: Michelle David is a 56 y.o. female who presents for ongoing assessment and management of chronic systolic heart failure, EF of 06-23%, grade 2 diastolic dysfunction, history of mild MR and AR, dyslipidemia, hypertension, with other history to include obesity and dyspepsia. She was last seen in the office on 01/29/2017 post hospitalization after diuresis for decompensated heart failure.  On that last office visit the patient's carvedilol was increased to 6.25 mg twice a day in the setting of uncontrolled hypertension. She was continued on ACE inhibitor. Consideration for change in Entresto on next office visit. She is advised on walking exercise daily. Was seen by primary care physician 2 days ago blood pressure was 138/84 on that office visit, weight 207 pounds.  She today feeling well with exception of sinus allergies. She denies significant dyspnea, she does feel her heart rate go up if she is walking up a hill. Energy level is slowly returning. She is tolerating Entresto 24/26. Blood pressure is still not optimal.  Past Medical History:  Diagnosis Date  . CHF (congestive heart failure) ([place])   . Dyslipidemia   . Dyspepsia   . Hypertension   . Hypertensive cardiovascular disease    LVH by echo, CHF March 2018  . Impaired glucose tolerance   . Obesity     Past Surgical History:  Procedure Laterality Date  . OOPHORECTOMY    . TUBAL LIGATION       Current Outpatient Prescriptions  Medication Sig Dispense Refill  . ASPIRIN LOW DOSE 81 MG EC tablet TAKE 1 TABLET BY MOUTH DAILY 30 tablet 0  . atorvastatin (LIPITOR) 40 MG tablet TAKE 1 TABLET BY MOUTH DAILY AT 6PM 30 tablet 3  . azelastine (ASTELIN) 0.1 %  nasal spray Place 2 sprays into both nostrils 2 (two) times daily. Use in each nostril as directed 30 mL 12  . carvedilol (COREG) 6.25 MG tablet Take 1 tablet (6.25 mg total) by mouth 2 (two) times daily. 180 tablet 3  . furosemide (LASIX) 40 MG tablet TAKE 1 TABLET BY MOUTH DAILY 30 tablet 3  . sacubitril-valsartan (ENTRESTO) 24-26 MG Take 1 tablet by mouth 2 (two) times daily. 60 tablet 6  . triamterene-hydrochlorothiazide (MAXZIDE) 75-50 MG tablet Take 1 tablet by mouth daily. 90 tablet 1   No current facility-administered medications for this visit.     Allergies:   Patient has no known allergies.    Social History:  The patient  reports that she has never smoked. She has never used smokeless tobacco. She reports that she does not drink alcohol or use drugs.   Family History:  The patient's family history includes Heart disease in her mother; Hypertension in her mother.    ROS: All other systems are reviewed and negative. Unless otherwise mentioned in H&P    PHYSICAL EXAM: VS:  BP 140/88   Pulse (!) 103   Ht 5\' 2"  (1.575 m)   Wt 205 lb (93 kg)   SpO2 95%   BMI 37.49 kg/m  , BMI Body mass index is 37.49 kg/m. GEN: Well nourished, well developed, in no acute distress  HEENT: normal significant sinus congestion and drainage. Neck: no JVD, carotid bruits,  or masses Cardiac: RRR; no murmurs, rubs, or gallops,no edema  Respiratory:  clear to auscultation bilaterally, normal work of breathing. Some upper airway congestion with some coughing. GI: soft, nontender, nondistended, + BS MS: no deformity or atrophy  Skin: warm and dry, no rash Neuro:  Strength and sensation are intact Psych: euthymic mood, full affect   Recent Labs: 01/15/2017: TSH 2.699 01/18/2017: Magnesium 2.1 03/26/2017: B Natriuretic Peptide 266.0; BUN 17; Creatinine, Ser 0.89; Hemoglobin 11.7; Platelets 343; Potassium 3.4; Sodium 140    Lipid Panel    Component Value Date/Time   CHOL 188 01/15/2017 1816    TRIG 130 01/15/2017 1816   HDL 45 01/15/2017 1816   CHOLHDL 4.2 01/15/2017 1816   VLDL 26 01/15/2017 1816   LDLCALC 117 (H) 01/15/2017 1816      Wt Readings from Last 3 Encounters:  04/02/17 205 lb (93 kg)  03/26/17 200 lb 6 oz (90.9 kg)  03/02/17 208 lb (94.3 kg)      Other studies Reviewed: Echocardiogram 2017-02-03 Left ventricle: The cavity size was normal. Wall thickness was   increased increased in a pattern of mild to moderate LVH.   Systolic function was moderately to severely reduced. The   estimated ejection fraction was in the range of 30% to 35%.   Diffuse hypokinesis. Features are consistent with a pseudonormal   left ventricular filling pattern, with concomitant abnormal   relaxation and increased filling pressure (grade 2 diastolic   dysfunction). Doppler parameters are consistent with high   ventricular filling pressure. - Aortic valve: There was mild regurgitation. Valve area (VTI):   1.78 cm^2. Valve area (Vmax): 1.77 cm^2. - Mitral valve: There was mild regurgitation. - Left atrium: The atrium was mildly dilated. - Technically difficult study. Echocontrast was used to enhance   visualization.  ASSESSMENT AND PLAN:  1. Chronic mixed CHF: Blood pressure is not optimal for ejection fraction of 30-35%. She is tolerating medications without complaints of dizziness or worsening dyspnea. I will increase Entresto to 49/51 mg twice a day. Samples are provided. We'll see her again in one month to evaluate her response to treatment. She will continue Lasix, carvedilol 6.25 mg twice a day. We'll titrate carvedilol on next office visit. She should continue to weigh herself daily. I have commended her on daily walking 15 minutes a day.  2. Hypertension: She remains on triamterene HCTZ, along with carvedilol, and Entresto as discussed above. Blood pressure is not optimal for EF. Increasing Entresto as discussed. I reviewed her labs completed by Dr. Moshe Cipro yesterday. Renal  function is normal. Potassium was 3.6. Will not add supplementation as we've increased her dose of Entresto. Follow-up labs on next visit.  3. Chronic sinus allergies: She is taking over-the-counter loratadine. I'll advise for chest congestion and coughing continues guaifenesin DM.  Current medicines are reviewed at length with the patient today.    Labs/ tests ordered today include:  Phill Myron. [place] Pugh, ANP, AACC   04/02/2017 1:30 PM    [facility] [address], [place], [place] [address] Phone: [phone]; Fax: [phone]

## 2017-04-02 NOTE — Patient Instructions (Signed)
Your physician recommends that you schedule a follow-up appointment in: 1 Month   Your physician has recommended you make the following change in your medication:   Increase Entresto to 49-51 MG  Start Mucinex DM 30-600 mg 12HR   If you need a refill on your cardiac medications before your next appointment, please call your pharmacy.  Thank you for choosing [facility]!

## 2017-04-17 ENCOUNTER — Other Ambulatory Visit: Payer: Self-pay | Admitting: Adult Health

## 2017-05-04 ENCOUNTER — Ambulatory Visit: Payer: BLUE CROSS/BLUE SHIELD | Admitting: Adult Health

## 2017-05-07 ENCOUNTER — Encounter: Payer: Self-pay | Admitting: Adult Health

## 2017-05-07 ENCOUNTER — Ambulatory Visit (INDEPENDENT_AMBULATORY_CARE_PROVIDER_SITE_OTHER): Payer: BLUE CROSS/BLUE SHIELD | Admitting: Adult Health

## 2017-05-07 VITALS — BP 160/88 | HR 84 | Ht 62.0 in | Wt 214.0 lb

## 2017-05-07 DIAGNOSIS — I10 Essential (primary) hypertension: Secondary | ICD-10-CM | POA: Diagnosis not present

## 2017-05-07 DIAGNOSIS — Z9114 Patient's other noncompliance with medication regimen: Secondary | ICD-10-CM

## 2017-05-07 DIAGNOSIS — I5042 Chronic combined systolic (congestive) and diastolic (congestive) heart failure: Secondary | ICD-10-CM | POA: Diagnosis not present

## 2017-05-07 DIAGNOSIS — I43 Cardiomyopathy in diseases classified elsewhere: Secondary | ICD-10-CM | POA: Diagnosis not present

## 2017-05-07 MED ORDER — TRIAMTERENE-HCTZ 75-50 MG PO TABS: 1.0000 | ORAL_TABLET | Freq: Every day | ORAL | 1 refills | Status: DC

## 2017-05-07 MED ORDER — ATORVASTATIN CALCIUM 40 MG PO TABS: ORAL_TABLET | ORAL | 3 refills | Status: DC

## 2017-05-07 MED ORDER — SACUBITRIL-VALSARTAN 49-51 MG PO TABS: 1.0000 | ORAL_TABLET | Freq: Two times a day (BID) | ORAL | 11 refills | Status: DC

## 2017-05-07 MED ORDER — FUROSEMIDE 40 MG PO TABS: 40.0000 mg | ORAL_TABLET | Freq: Every day | ORAL | 3 refills | Status: DC

## 2017-05-07 MED ORDER — CARVEDILOL 6.25 MG PO TABS: 6.2500 mg | ORAL_TABLET | Freq: Two times a day (BID) | ORAL | 3 refills | Status: DC

## 2017-05-07 NOTE — Progress Notes (Signed)
Cardiology Office Note   Date:  05/07/2017   ID:  Michelle, David 1960-12-18, MRN [id number]  PCP:  Michelle Helper, MD  Cardiologist: Michelle David  Chief Complaint  Patient presents with  . Congestive Heart Failure  . Hypertension      History of Present Illness: Michelle David is a 56 y.o. female who presents for ongoing assessment and management of hypertension, chronic systolic heart failure, reduced EF of 30-35% with grade 2 diastolic dysfunction, history of valvular disease with mild MR and AR, dyslipidemia, with other history to include obesity and dyspepsia.   The patient was hospitalized in April with decompensated heart failure. She was last seen in the office on 04/02/2017. At that time Michelle David was increased to 49/51 mg twice a day with samples provided. She remains on triamterene HCTZ along with carvedilol. The patient is here for follow-up concerning response to medication .  She is here today confused about her medications once again. She has not been taking carvedilol twice a day as directed. She is not been taking travertine, and has run out of Lasix. She has gained 7 pounds since last office visit. She states that she is also not been compliant with her diet and has not avoided salt.  Past Medical History:  Diagnosis Date  . CHF (congestive heart failure) ([place])   . Dyslipidemia   . Dyspepsia   . Hypertension   . Hypertensive cardiovascular disease    LVH by echo, CHF March 2018  . Impaired glucose tolerance   . Obesity     Past Surgical History:  Procedure Laterality Date  . OOPHORECTOMY    . TUBAL LIGATION       Current Outpatient Prescriptions  Medication Sig Dispense Refill  . ASPIRIN LOW DOSE 81 MG EC tablet TAKE 1 TABLET BY MOUTH DAILY 30 tablet 6  . atorvastatin (LIPITOR) 40 MG tablet TAKE 1 TABLET BY MOUTH DAILY AT 6PM 90 tablet 3  . azelastine (ASTELIN) 0.1 % nasal spray Place 2 sprays into both nostrils 2 (two) times daily. Use in each  nostril as directed 30 mL 12  . carvedilol (COREG) 6.25 MG tablet Take 1 tablet (6.25 mg total) by mouth 2 (two) times daily. 180 tablet 3  . dextromethorphan-guaiFENesin (MUCINEX DM) 30-600 MG 12hr tablet Take 1 tablet by mouth 2 (two) times daily. 60 tablet 0  . furosemide (LASIX) 40 MG tablet Take 1 tablet (40 mg total) by mouth daily. 90 tablet 3  . sacubitril-valsartan (ENTRESTO) 49-51 MG Take 1 tablet by mouth 2 (two) times daily. Please bubble pack 60 tablet 11  . triamterene-hydrochlorothiazide (MAXZIDE) 75-50 MG tablet Take 1 tablet by mouth daily. Please bubble pack 90 tablet 1   No current facility-administered medications for this visit.     Allergies:   Patient has no known allergies.    Social History:  The patient  reports that she has never smoked. She has never used smokeless tobacco. She reports that she does not drink alcohol or use drugs.   Family History:  The patient's family history includes Heart disease in her mother; Hypertension in her mother.    ROS: All other systems are reviewed and negative. Unless otherwise mentioned in H&P    PHYSICAL EXAM: VS:  BP (!) 160/88   Pulse 84   Ht 5\' 2"  (1.575 m)   Wt 214 lb (97.1 kg)   SpO2 98%   BMI 39.14 kg/m  , BMI Body mass index is 39.14 kg/m.  GEN: Well nourished, well developed, in no acute distress  HEENT: normal Several missing teeth Neck: no JVD, carotid bruits, or masses Cardiac: RRR; tachycardic no murmurs, rubs, or gallops,no edema  Respiratory: Mild crackles in the bases, frequent coughing. No wheezing. GI: soft, nontender, nondistended, + BS MS: no deformity or atrophy  Skin: warm and dry, no rash Neuro:  Strength and sensation are intact Psych: euthymic mood, full affect  Recent Labs: 01/15/2017: TSH 2.699 01/18/2017: Magnesium 2.1 03/26/2017: B Natriuretic Peptide 266.0; BUN 17; Creatinine, Ser 0.89; Hemoglobin 11.7; Platelets 343; Potassium 3.4; Sodium 140    Lipid Panel    Component Value  Date/Time   CHOL 188 01/15/2017 1816   TRIG 130 01/15/2017 1816   HDL 45 01/15/2017 1816   CHOLHDL 4.2 01/15/2017 1816   VLDL 26 01/15/2017 1816   LDLCALC 117 (H) 01/15/2017 1816      Wt Readings from Last 3 Encounters:  05/07/17 214 lb (97.1 kg)  04/02/17 205 lb (93 kg)  03/30/17 207 lb 1.3 oz (93.9 kg)      Other studies Reviewed:  Echo 01/16/2017 Left ventricle: The cavity size was normal. Wall thickness was   increased increased in a pattern of mild to moderate LVH.   Systolic function was moderately to severely reduced. The   estimated ejection fraction was in the range of 30% to 35%.   Diffuse hypokinesis. Features are consistent with a pseudonormal   left ventricular filling pattern, with concomitant abnormal   relaxation and increased filling pressure (grade 2 diastolic   dysfunction). Doppler parameters are consistent with high   ventricular filling pressure. - Aortic valve: There was mild regurgitation. Valve area (VTI):   1.78 cm^2. Valve area (Vmax): 1.77 cm^2. - Mitral valve: There was mild regurgitation. - Left atrium: The atrium was mildly dilated. - Technically difficult study. Echocontrast was used to enhance  ASSESSMENT AND PLAN:  1.  Chronic systolic CHF: Patient skin 7 pounds since being seen last. She has not been taking Lasix no has she been taking triamterene as directed. She is confused on her medications. The patient has had some shortness of breath. No extensive fluid retention on this exam.   2. Nonischemic cardiomyopathy: Most recent EF is between 30 and 35%. Blood pressure is not optimal for reduced EF. I believe this is related to congestion of her medications and noncompliance issues. A bubble pack for all of her medications is requested by which will be handled by pharmacy. She is given new prescriptions for Lasix, along with Triamatene HCTZ. She is instructed to take carvedilol twice a day.  3. Hypertension: Not controlled. Patient will need  better medical compliance. [facility] is being consulted.    . Current medicines are reviewed at length with the patient today.   she will need to have a bubble pack from [place] to assure that she is getting her medications as directed, labeled correctly, with reinforcement at home to take them as prescribed. I am going to have [facility] see her for assistance and reinforcement of medications.  Labs/ tests ordered today include:  Phill Myron. West Pugh, ANP, AACC   05/07/2017 4:25 PM    [facility] [address], [place], [place] [address] Phone: [phone]; Fax: [phone]

## 2017-05-07 NOTE — Patient Instructions (Signed)
Medication Instructions:  Your physician recommends that you continue on your current medications as directed. Please refer to the Current Medication list given to you today.   Labwork: None  Testing/Procedures: None   Follow-Up: BP check 1 week  Your physician recommends that you schedule a follow-up appointment in: 1 Month   Any Other Special Instructions Will Be Listed Below (If Applicable). None    If you need a refill on your cardiac medications before your next appointment, please call your pharmacy.

## 2017-05-14 ENCOUNTER — Ambulatory Visit: Payer: BLUE CROSS/BLUE SHIELD | Admitting: *Deleted

## 2017-05-14 VITALS — BP 160/98 | HR 73

## 2017-05-14 DIAGNOSIS — I10 Essential (primary) hypertension: Secondary | ICD-10-CM

## 2017-05-14 NOTE — Progress Notes (Signed)
Patient states that pharmacy will start bubble pack next month. She is not currently taking Maxzide b/c she has lost her current bottle. Patient encouraged to get enough medication until bubble packs are ready for pick up. No c/o other that constant cough. Please advise.

## 2017-05-14 NOTE — Progress Notes (Signed)
As discussed. She will come back for a nurse visit to fill her medication tray and review medications until bubble pack can be made by [employer].

## 2017-05-15 ENCOUNTER — Ambulatory Visit: Payer: BLUE CROSS/BLUE SHIELD

## 2017-05-22 ENCOUNTER — Ambulatory Visit: Payer: BLUE CROSS/BLUE SHIELD

## 2017-06-05 NOTE — Progress Notes (Signed)
Cardiology Office Note   Date:  06/08/2017   ID:  Michelle, David [birth year], MRN [id number]  PCP:  Fayrene Helper, MD  Cardiologist: Domenic Polite  Chief Complaint  Patient presents with  . Hypertension  . Congestive Heart Failure   History of Present Illness: Michelle David is a 56 y.o. female who presents for ongoing assessment and management of chronic systolic CHF, Hypertension, EF of 35%. Grade II diastolic dysfunction, MR and mild AR, along with obesity, and dyspepsia. She has been seen frequently by Mrs. Almyra Deforest, LPN, who has gone over medications in between visits. She has arranged for a bubble pack to be made for her medications, to make this easier for her to take her medications.   She is now medically compliant. She has brought with her the pill dispenser that was filled by Mrs. Almyra Deforest and she is taking the medications as directed now. Bubble pack will start in 10 days. She is avoiding salt and has lost 3 lbs since being seen last. [name]   Past Medical History:  Diagnosis Date  . CHF (congestive heart failure) ([place])   . Dyslipidemia   . Dyspepsia   . Hypertension   . Hypertensive cardiovascular disease    LVH by echo, CHF March 2018  . Impaired glucose tolerance   . Obesity     Past Surgical History:  Procedure Laterality Date  . OOPHORECTOMY    . TUBAL LIGATION       Current Outpatient Prescriptions  Medication Sig Dispense Refill  . ASPIRIN LOW DOSE 81 MG EC tablet TAKE 1 TABLET BY MOUTH DAILY 30 tablet 6  . atorvastatin (LIPITOR) 40 MG tablet TAKE 1 TABLET BY MOUTH DAILY AT 6PM 90 tablet 3  . carvedilol (COREG) 6.25 MG tablet Take 1 tablet (6.25 mg total) by mouth 2 (two) times daily. 180 tablet 3  . furosemide (LASIX) 40 MG tablet Take 1 tablet (40 mg total) by mouth daily. 90 tablet 3  . montelukast (SINGULAIR) 10 MG tablet Take 1 tablet (10 mg total) by mouth at bedtime. 30 tablet 3  .  sacubitril-valsartan (ENTRESTO) 49-51 MG Take 1 tablet by mouth 2 (two) times daily. Please bubble pack 60 tablet 11  . triamterene-hydrochlorothiazide (MAXZIDE) 75-50 MG tablet Take 1 tablet by mouth daily. Please bubble pack 90 tablet 1   No current facility-administered medications for this visit.     Allergies:   Patient has no known allergies.    Social History:  The patient  reports that she has never smoked. She has never used smokeless tobacco. She reports that she does not drink alcohol or use drugs.   Family History:  The patient's family history includes Heart disease in her mother; Hypertension in her mother.    ROS: All other systems are reviewed and negative. Unless otherwise mentioned in H&P    PHYSICAL EXAM: VS:  BP 134/80 (BP Location: Right Arm)   Pulse 65   Ht 5' 2" (1.575 m)   Wt 208 lb (94.3 kg)   SpO2 97%   BMI 38.04 kg/m  , BMI Body mass index is 38.04 kg/m. GEN: Well nourished, well developed, in no acute distress  HEENT: normal  Neck: no JVD, carotid bruits, or masses Cardiac: RRR; no murmurs, rubs, or gallops,no edema  Respiratory:  clear to auscultation bilaterally, normal work of breathing GI: soft, nontender, nondistended, + BS MS: no deformity or atrophy  Skin: warm  and dry, no rash Neuro:  Strength and sensation are intact Psych: euthymic mood, full affect   Recent Labs: 01/15/2017: TSH 2.699 01/18/2017: Magnesium 2.1 03/26/2017: B Natriuretic Peptide 266.0; BUN 17; Creatinine, Ser 0.89; Hemoglobin 11.7; Platelets 343; Potassium 3.4; Sodium 140    Lipid Panel    Component Value Date/Time   CHOL 188 01/15/2017 1816   TRIG 130 01/15/2017 1816   HDL 45 01/15/2017 1816   CHOLHDL 4.2 01/15/2017 1816   VLDL 26 01/15/2017 1816   LDLCALC 117 (H) 01/15/2017 1816      Wt Readings from Last 3 Encounters:  06/08/17 208 lb (94.3 kg)  06/08/17 210 lb (95.3 kg)  05/07/17 214 lb (97.1 kg)      Other studies Reviewed:  Echocardiogram  01/16/2017 Left ventricle: The cavity size was normal. Wall thickness was   increased increased in a pattern of mild to moderate LVH.   Systolic function was moderately to severely reduced. The   estimated ejection fraction was in the range of 30% to 35%.   Diffuse hypokinesis. Features are consistent with a pseudonormal   left ventricular filling pattern, with concomitant abnormal   relaxation and increased filling pressure (grade 2 diastolic   dysfunction). Doppler parameters are consistent with high   ventricular filling pressure. - Aortic valve: There was mild regurgitation. Valve area (VTI):   1.78 cm^2. Valve area (Vmax): 1.77 cm^2. - Mitral valve: There was mild regurgitation. - Left atrium: The atrium was mildly dilated.   ASSESSMENT AND PLAN:  1.  Uncontrolled hypertension: Through the efforts of Mrs. Almyra Deforest, LPN, who has met with this patient almost weekly to go over her medications, and made sure that they are placed in her medication dispenser appropriately, her BP is much better controlled. Although still not optimal for reduced EF, we will take it. I will adjust her medications on follow up appointment next month to avoid further confusion.. May increase Entresto dose which will be added to her bubble pack at [facility].   2. Chronic Systolic CHF: Weight is down 2 lbs and she is avoiding salt. She will continue to weigh daily and take coreg, lasix, and Entresto as directed. As stated, may increase her dose on next visit. Will repeat her echo in 3 months.        Current medicines are reviewed at length with the patient today.    Labs/ tests ordered today include:  Phill Myron. West Pugh, ANP, AACC   06/08/2017 4:24 PM    [facility] [address], [place], [place] [address] Phone: [phone]; Fax: [phone]

## 2017-06-08 ENCOUNTER — Ambulatory Visit (INDEPENDENT_AMBULATORY_CARE_PROVIDER_SITE_OTHER): Payer: BLUE CROSS/BLUE SHIELD | Admitting: Family Medicine

## 2017-06-08 ENCOUNTER — Ambulatory Visit (INDEPENDENT_AMBULATORY_CARE_PROVIDER_SITE_OTHER): Payer: BLUE CROSS/BLUE SHIELD | Admitting: Adult Health

## 2017-06-08 ENCOUNTER — Encounter: Payer: Self-pay | Admitting: Adult Health

## 2017-06-08 ENCOUNTER — Encounter: Payer: Self-pay | Admitting: Family Medicine

## 2017-06-08 VITALS — BP 134/80 | HR 65 | Ht 62.0 in | Wt 208.0 lb

## 2017-06-08 VITALS — BP 130/80 | HR 82 | Resp 16 | Ht 62.0 in | Wt 210.0 lb

## 2017-06-08 DIAGNOSIS — E785 Hyperlipidemia, unspecified: Secondary | ICD-10-CM

## 2017-06-08 DIAGNOSIS — I10 Essential (primary) hypertension: Secondary | ICD-10-CM | POA: Diagnosis not present

## 2017-06-08 DIAGNOSIS — I5042 Chronic combined systolic (congestive) and diastolic (congestive) heart failure: Secondary | ICD-10-CM

## 2017-06-08 DIAGNOSIS — J309 Allergic rhinitis, unspecified: Secondary | ICD-10-CM | POA: Diagnosis not present

## 2017-06-08 MED ORDER — MONTELUKAST SODIUM 10 MG PO TABS: 10.0000 mg | ORAL_TABLET | Freq: Every day | ORAL | 3 refills | Status: DC

## 2017-06-08 NOTE — Patient Instructions (Signed)
Medication Instructions:  Your physician recommends that you continue on your current medications as directed. Please refer to the Current Medication list given to you today.   Labwork: NONE  Testing/Procedures: NONE  Follow-Up: Your physician recommends that you schedule a follow-up appointment in: 1 MONTH   Any Other Special Instructions Will Be Listed Below (If Applicable).     If you need a refill on your cardiac medications before your next appointment, please call your pharmacy.   

## 2017-06-08 NOTE — Patient Instructions (Addendum)
F/u In January, call if you need me before  Call and come for flu vaccine  In the next week   Fasting lipid and cmp 3 days before next visit.  Please go to radiology dept after you leave today and see when you can get your overdue mammogram  New medication singlair for allergies  Please arrange and get colonoscopy, we will give you the letter to follow through  Please work on good  health habits so that your health will improve. 1. Commitment to daily physical activity for 30 to 60  minutes, if you are able to do this.  2. Commitment to wise food choices. Aim for half of your  food intake to be vegetable and fruit, one quarter starchy foods, and one quarter protein. Try to eat on a regular schedule  3 meals per day, snacking between meals should be limited to vegetables or fruits or small portions of nuts. 64 ounces of water per day is generally recommended, unless you have specific health conditions, like heart failure or kidney failure where you will need to limit fluid intake.  3. Commitment to sufficient and a  good quality of physical and mental rest daily, generally between 6 to 8 hours per day.  WITH PERSISTANCE AND PERSEVERANCE, THE IMPOSSIBLE , BECOMES THE NORM! Thank you  for choosing [facility]. We consider it a privelige to serve you.  Delivering excellent health care in a caring and  compassionate way is our goal.  Partnering with you,  so that together we can achieve this goal is our strategy.

## 2017-06-13 DIAGNOSIS — J309 Allergic rhinitis, unspecified: Secondary | ICD-10-CM

## 2017-06-13 HISTORY — DX: Allergic rhinitis, unspecified: J30.9

## 2017-06-13 NOTE — Assessment & Plan Note (Signed)
Increased and uncontrolled symptoms add singulair daily

## 2017-06-13 NOTE — Assessment & Plan Note (Signed)
Stable, no symptoms or signs of decompensation

## 2017-06-13 NOTE — Assessment & Plan Note (Signed)
Controlled, no change in medication DASH diet and commitment to daily physical activity for a minimum of 30 minutes discussed and encouraged, as a part of hypertension management. The importance of attaining a healthy weight is also discussed.  BP/Weight 06/08/2017 06/08/2017 05/14/2017 05/07/2017 04/02/2017 9/79/1504 10/22/6436  Systolic BP 377 939 688 648 472 072 182  Diastolic BP 80 80 98 88 88 84 70  Wt. (Lbs) 210 208 - 214 205 207.08 200.38  BMI 38.41 38.04 - 39.14 37.49 37.88 36.65

## 2017-06-13 NOTE — Assessment & Plan Note (Signed)
Hyperlipidemia:Low fat diet discussed and encouraged.   Lipid Panel  Lab Results  Component Value Date   CHOL 188 01/15/2017   HDL 45 01/15/2017   LDLCALC 117 (H) 01/15/2017   TRIG 130 01/15/2017   CHOLHDL 4.2 01/15/2017   Needs to  Reduce fried and fatty foods

## 2017-06-13 NOTE — Progress Notes (Signed)
   Michelle David     MRN: [id number]      DOB: 1961-04-13   HPI Michelle David is here for follow up and re-evaluation of chronic medical conditions, medication management and review of any available recent lab and radiology data.  Preventive health is updated, specifically  Cancer screening and Immunization.  Both ar overdue Questions or concerns regarding consultations or procedures which the PT has had in the interim are  addressed. The PT denies any adverse reactions to current medications since the last visit.  C/o increased nasal drainage , clear and sneezing, uncontrolled allergies, denies fever or chills, has cough from post nasal drainage   ROS . Denies chest pains, palpitations and leg swelling Denies abdominal pain, nausea, vomiting,diarrhea or constipation.   Denies dysuria, frequency, hesitancy or incontinence. Denies joint pain, swelling and limitation in mobility. Denies headaches, seizures, numbness, or tingling. Denies depression, anxiety or insomnia. Denies skin break down or rash.   PE  BP 130/80   Pulse 82   Resp 16   Ht 5\' 2"  (1.575 m)   Wt 210 lb (95.3 kg)   SpO2 94%   BMI 38.41 kg/m   Patient alert and oriented and in no cardiopulmonary distress.  HEENT: No facial asymmetry, EOMI,   oropharynx pink and moist.  Neck supple no JVD, no mass. Nasal mucosa edematous, TM clear Chest: Clear to auscultation bilaterally.  CVS: S1, S2 systolic  murmur, no S3.Regular rate.  ABD: Soft non tender.   Ext: No edema  MS: Adequate ROM spine, shoulders, hips and knees.  Skin: Intact, no ulcerations or rash noted.  Psych: Good eye contact, normal affect. Memory intact not anxious or depressed appearing.  CNS: CN 2-12 intact, power,  normal throughout.no focal deficits noted.   Assessment & Plan  HTN (hypertension), malignant Controlled, no change in medication DASH diet and commitment to daily physical activity for a minimum of 30 minutes discussed and  encouraged, as a part of hypertension management. The importance of attaining a healthy weight is also discussed.  BP/Weight 06/08/2017 06/08/2017 05/14/2017 05/07/2017 04/02/2017 9/38/1829 06/22/7168  Systolic BP 678 938 101 751 025 852 778  Diastolic BP 80 80 98 88 88 84 70  Wt. (Lbs) 210 208 - 214 205 207.08 200.38  BMI 38.41 38.04 - 39.14 37.49 37.88 36.65       Hyperlipidemia LDL goal <100 Hyperlipidemia:Low fat diet discussed and encouraged.   Lipid Panel  Lab Results  Component Value Date   CHOL 188 01/15/2017   HDL 45 01/15/2017   LDLCALC 117 (H) 01/15/2017   TRIG 130 01/15/2017   CHOLHDL 4.2 01/15/2017   Needs to  Reduce fried and fatty foods    Morbid obesity Deteriorated. Patient re-educated about  the importance of commitment to a  minimum of 150 minutes of exercise per week.  The importance of healthy food choices with portion control discussed. Encouraged to start a food diary, count calories and to consider  joining a support group. Sample diet sheets offered. Goals set by the patient for the next several months.   Weight /BMI 06/08/2017 06/08/2017 05/07/2017  WEIGHT 210 lb 208 lb 214 lb  HEIGHT 5\' 2"  5\' 2"  5\' 2"   BMI 38.41 kg/m2 38.04 kg/m2 39.14 kg/m2      Allergic rhinitis Increased and uncontrolled symptoms add singulair daily  Chronic combined systolic and diastolic CHF (congestive heart failure) ([facility]) Stable, no symptoms or signs of decompensation

## 2017-06-13 NOTE — Assessment & Plan Note (Signed)
Deteriorated. Patient re-educated about  the importance of commitment to a  minimum of 150 minutes of exercise per week.  The importance of healthy food choices with portion control discussed. Encouraged to start a food diary, count calories and to consider  joining a support group. Sample diet sheets offered. Goals set by the patient for the next several months.   Weight /BMI 06/08/2017 06/08/2017 05/07/2017  WEIGHT 210 lb 208 lb 214 lb  HEIGHT 5\' 2"  5\' 2"  5\' 2"   BMI 38.41 kg/m2 38.04 kg/m2 39.14 kg/m2

## 2017-06-17 ENCOUNTER — Inpatient Hospital Stay: Admission: RE | Admit: 2017-06-17 | Payer: BLUE CROSS/BLUE SHIELD | Source: Ambulatory Visit

## 2017-06-20 ENCOUNTER — Other Ambulatory Visit: Payer: Self-pay | Admitting: Family Medicine

## 2017-06-20 DIAGNOSIS — I10 Essential (primary) hypertension: Secondary | ICD-10-CM

## 2017-06-23 NOTE — Telephone Encounter (Signed)
Blood pressure controlled with this medication

## 2017-07-14 ENCOUNTER — Other Ambulatory Visit: Payer: Self-pay | Admitting: Family Medicine

## 2017-07-15 ENCOUNTER — Other Ambulatory Visit: Payer: Self-pay | Admitting: Family Medicine

## 2017-07-17 ENCOUNTER — Other Ambulatory Visit: Payer: Self-pay | Admitting: Family Medicine

## 2017-07-21 ENCOUNTER — Encounter: Payer: Self-pay | Admitting: Cardiology

## 2017-07-21 ENCOUNTER — Other Ambulatory Visit (HOSPITAL_COMMUNITY)
Admission: RE | Admit: 2017-07-21 | Discharge: 2017-07-21 | Disposition: A | Discharge status: Home / Self Care | Payer: BLUE CROSS/BLUE SHIELD | Source: Ambulatory Visit | Attending: Cardiology | Admitting: Cardiology

## 2017-07-21 ENCOUNTER — Other Ambulatory Visit: Payer: Self-pay | Admitting: Family Medicine

## 2017-07-21 ENCOUNTER — Ambulatory Visit (INDEPENDENT_AMBULATORY_CARE_PROVIDER_SITE_OTHER): Payer: BLUE CROSS/BLUE SHIELD | Admitting: Cardiology

## 2017-07-21 VITALS — BP 134/78 | HR 72 | Ht 62.0 in | Wt 215.0 lb

## 2017-07-21 DIAGNOSIS — I428 Other cardiomyopathies: Secondary | ICD-10-CM

## 2017-07-21 DIAGNOSIS — I10 Essential (primary) hypertension: Secondary | ICD-10-CM

## 2017-07-21 DIAGNOSIS — E782 Mixed hyperlipidemia: Secondary | ICD-10-CM

## 2017-07-21 DIAGNOSIS — I429 Cardiomyopathy, unspecified: Secondary | ICD-10-CM | POA: Insufficient documentation

## 2017-07-21 DIAGNOSIS — R7302 Impaired glucose tolerance (oral): Secondary | ICD-10-CM

## 2017-07-21 LAB — BASIC METABOLIC PANEL
Anion gap: 7 (ref 5–15)
BUN: 18 mg/dL (ref 6–20)
CALCIUM: 8.7 mg/dL — AB (ref 8.9–10.3)
CO2: 32 mmol/L (ref 22–32)
CREATININE: 0.89 mg/dL (ref 0.44–1.00)
Chloride: 99 mmol/L — ABNORMAL LOW (ref 101–111)
GFR calc non Af Amer: >60 mL/min (ref >60)
Glucose, Bld: 102 mg/dL — ABNORMAL HIGH (ref 65–99)
Potassium: 3.6 mmol/L (ref 3.5–5.1)
SODIUM: 138 mmol/L (ref 135–145)

## 2017-07-21 NOTE — Patient Instructions (Signed)
Your physician recommends that you schedule a follow-up appointment in: 3 months with Dr.McDowell     Your physician has requested that you have an echocardiogram. Echocardiography is a painless test that uses sound waves to create images of your heart. It provides your doctor with information about the size and shape of your heart and how well your heart's chambers and valves are working. This procedure takes approximately one hour. There are no restrictions for this procedure.      Get lab work today : BMET     Your physician recommends that you continue on your current medications as directed. Please refer to the Current Medication list given to you today.      If you need a refill on your cardiac medications before your next appointment, please call your pharmacy.           Thank you for choosing [facility] !

## 2017-07-21 NOTE — Telephone Encounter (Signed)
Patient calling in ref to her Rx Zantac 300mg .  She states she called the pharmacy ([facility]) and they could not refill without authorization from Dr. Moshe Cipro and they have yet to hear anything from our office.  She is completely out.    cb  [phone]

## 2017-07-21 NOTE — Progress Notes (Signed)
Cardiology Office Note  Date: 07/21/2017   ID: Koby, Hart[place] 12/24/1960, MRN [id number]  PCP: Fayrene Helper, MD  Primary Cardiologist: Rozann Lesches, MD   Chief Complaint  Patient presents with  . Cardiomyopathy    History of Present Illness: Michelle David is a 56 y.o. female seen recently by Ms. Lawrence DNP in August. I have not actually seen her in the office since 2012. I reviewed interval records and updated the chart. She has been treated for presumed nonischemic cardiomyopathy with LVEF 30-35% based on assessment in March of this year. She presents today for a routine follow-up visit. States that she feels better overall, reports NYHA class II dyspnea, no chest pain, palpitations, or syncope.  She has been compliant with her medications. Still likes to drink sodas and her weight has come up some since last visit. Still has no leg edema, orthopnea, or PND.  She has not had follow-up assessment of LVEF on current medical regimen.  She also describes having cramps in her hands.  Past Medical History:  Diagnosis Date  . Cardiomyopathy [facility])    Diagnosed March 2018  . Dyspepsia   . Essential hypertension   . Hyperlipidemia   . Impaired glucose tolerance   . Obesity     Past Surgical History:  Procedure Laterality Date  . OOPHORECTOMY    . TUBAL LIGATION      Current Outpatient Prescriptions  Medication Sig Dispense Refill  . ASPIRIN LOW DOSE 81 MG EC tablet TAKE 1 TABLET BY MOUTH DAILY 30 tablet 6  . atorvastatin (LIPITOR) 40 MG tablet TAKE 1 TABLET BY MOUTH DAILY AT 6PM 90 tablet 3  . carvedilol (COREG) 6.25 MG tablet Take 1 tablet (6.25 mg total) by mouth 2 (two) times daily. 180 tablet 3  . furosemide (LASIX) 40 MG tablet Take 1 tablet (40 mg total) by mouth daily. 90 tablet 3  . montelukast (SINGULAIR) 10 MG tablet Take 1 tablet (10 mg total) by mouth at bedtime. 30 tablet 3  . ranitidine (ZANTAC) 300 MG tablet Take 300 mg by mouth at  bedtime.    . sacubitril-valsartan (ENTRESTO) 49-51 MG Take 1 tablet by mouth 2 (two) times daily. Please bubble pack 60 tablet 11  . triamterene-hydrochlorothiazide (MAXZIDE) 75-50 MG tablet TAKE 1 TABLET BY MOUTH DAILY 90 tablet 0   No current facility-administered medications for this visit.    Allergies:  Patient has no known allergies.   Social History: The patient  reports that she has never smoked. She has never used smokeless tobacco. She reports that she does not drink alcohol or use drugs.   ROS:  Please see the history of present illness. Otherwise, complete review of systems is positive for none.  All other systems are reviewed and negative.   Physical Exam: VS:  BP 134/78   Pulse 72   Ht 5\' 2"  (1.575 m)   Wt 215 lb (97.5 kg)   SpO2 99%   BMI 39.32 kg/m , BMI Body mass index is 39.32 kg/m.  Wt Readings from Last 3 Encounters:  07/21/17 215 lb (97.5 kg)  06/08/17 208 lb (94.3 kg)  06/08/17 210 lb (95.3 kg)    General: Obese woman, appears comfortable at rest. HEENT: Conjunctiva and lids normal, oropharynx clear. Neck: Supple, no elevated JVP or carotid bruits, no thyromegaly. Lungs: Clear to auscultation, nonlabored breathing at rest. Cardiac: Regular rate and rhythm, no S3, soft systolic murmur, no pericardial rub. Abdomen: Soft, nontender, bowel  sounds present, no guarding or rebound. Extremities: No pitting edema, distal pulses 2+. Skin: Warm and dry. Musculoskeletal: No kyphosis. Neuropsychiatric: Alert and oriented x3, affect grossly appropriate.  ECG: I personally reviewed the tracing from 03/26/2017 which showed sinus tachycardia with PAC and increased voltage.  Recent Labwork: 01/15/2017: TSH 2.699 01/18/2017: Magnesium 2.1 03/26/2017: B Natriuretic Peptide 266.0; BUN 17; Creatinine, Ser 0.89; Hemoglobin 11.7; Platelets 343; Potassium 3.4; Sodium 140     Component Value Date/Time   CHOL 188 01/15/2017 1816   TRIG 130 01/15/2017 1816   HDL 45 01/15/2017 1816    CHOLHDL 4.2 01/15/2017 1816   VLDL 26 01/15/2017 1816   LDLCALC 117 (H) 01/15/2017 1816    Other Studies Reviewed Today:  Echocardiogram 01/16/2017: Study Conclusions  - Left ventricle: The cavity size was normal. Wall thickness was   increased increased in a pattern of mild to moderate LVH.   Systolic function was moderately to severely reduced. The   estimated ejection fraction was in the range of 30% to 35%.   Diffuse hypokinesis. Features are consistent with a pseudonormal   left ventricular filling pattern, with concomitant abnormal   relaxation and increased filling pressure (grade 2 diastolic   dysfunction). Doppler parameters are consistent with high   ventricular filling pressure. - Aortic valve: There was mild regurgitation. Valve area (VTI):   1.78 cm^2. Valve area (Vmax): 1.77 cm^2. - Mitral valve: There was mild regurgitation. - Left atrium: The atrium was mildly dilated. - Technically difficult study. Echocontrast was used to enhance   visualization.  Assessment and Plan:  1. Cardiomyopathy, possibly nonischemic based on echocardiogram from March of this year. Medical therapy has been adjusted since that time and she is clinically more stable at this point. Plan will be to obtain a follow-up echocardiogram to reevaluate LVEF. We discussed salt and fluid restriction guidelines. Also follow-up BMET to reassess potassium and renal function on diuretics.  2. Essential hypertension, systolic pressure in the 009F today.  3. Hyperlipidemia, on Lipitor. She follows with Dr. Moshe Cipro. LDL 117 in March.  4. Impaired glucose tolerance by history. Discussed diet and weight loss. She follows with Dr. Moshe Cipro.  Current medicines were reviewed with the patient today.   Orders Placed This Encounter  Procedures  . Basic Metabolic Panel (BMET)  . ECHOCARDIOGRAM COMPLETE    Disposition: Follow-up in 3 months.  Signed, Satira Sark, MD, [facility] 07/21/2017 2:14 PM      [facility] at [facility] [address], [place], [place] [address] Phone: [phone]; Fax: [phone]

## 2017-07-22 ENCOUNTER — Ambulatory Visit: Payer: BLUE CROSS/BLUE SHIELD | Admitting: Family Medicine

## 2017-07-24 ENCOUNTER — Ambulatory Visit (HOSPITAL_COMMUNITY)
Admission: RE | Admit: 2017-07-24 | Discharge: 2017-07-24 | Disposition: A | Discharge status: Home / Self Care | Payer: BLUE CROSS/BLUE SHIELD | Source: Ambulatory Visit | Attending: Cardiology | Admitting: Cardiology

## 2017-07-24 DIAGNOSIS — R011 Cardiac murmur, unspecified: Secondary | ICD-10-CM | POA: Diagnosis not present

## 2017-07-24 DIAGNOSIS — E785 Hyperlipidemia, unspecified: Secondary | ICD-10-CM | POA: Diagnosis not present

## 2017-07-24 DIAGNOSIS — I255 Ischemic cardiomyopathy: Secondary | ICD-10-CM | POA: Diagnosis not present

## 2017-07-24 DIAGNOSIS — I509 Heart failure, unspecified: Secondary | ICD-10-CM | POA: Insufficient documentation

## 2017-07-24 DIAGNOSIS — I110 Hypertensive heart disease with heart failure: Secondary | ICD-10-CM | POA: Insufficient documentation

## 2017-07-24 DIAGNOSIS — I080 Rheumatic disorders of both mitral and aortic valves: Secondary | ICD-10-CM | POA: Diagnosis not present

## 2017-07-24 DIAGNOSIS — I428 Other cardiomyopathies: Secondary | ICD-10-CM | POA: Insufficient documentation

## 2017-07-24 MED ORDER — PERFLUTREN LIPID MICROSPHERE
1.0000 mL | INTRAVENOUS | Status: AC | PRN
  Administered 2017-07-24: 2 mL via INTRAVENOUS
  Filled 2017-07-24: qty 10

## 2017-07-24 NOTE — Progress Notes (Signed)
*  PRELIMINARY RESULTS* Echocardiogram 2D Echocardiogram with definity has been performed.  Leavy Cella 07/24/2017, 3:48 PM

## 2017-08-01 ENCOUNTER — Other Ambulatory Visit: Payer: Self-pay | Admitting: Family Medicine

## 2017-08-01 MED ORDER — RANITIDINE HCL 300 MG PO TABS: 300.0000 mg | ORAL_TABLET | Freq: Every day | ORAL | 5 refills | Status: DC

## 2017-08-03 MED ORDER — RANITIDINE HCL 300 MG PO TABS: 300.0000 mg | ORAL_TABLET | Freq: Every day | ORAL | 5 refills | Status: DC

## 2017-08-03 NOTE — Telephone Encounter (Signed)
Refill has been sent to the pharmacy and pt is aware. 

## 2017-08-20 ENCOUNTER — Telehealth: Payer: Self-pay | Admitting: *Deleted

## 2017-08-20 ENCOUNTER — Encounter: Payer: Self-pay | Admitting: Family Medicine

## 2017-08-20 ENCOUNTER — Ambulatory Visit (INDEPENDENT_AMBULATORY_CARE_PROVIDER_SITE_OTHER): Payer: BLUE CROSS/BLUE SHIELD | Admitting: Family Medicine

## 2017-08-20 VITALS — BP 128/76 | HR 81 | Temp 98.3°F | Resp 16 | Ht 62.0 in | Wt 218.5 lb

## 2017-08-20 DIAGNOSIS — K047 Periapical abscess without sinus: Secondary | ICD-10-CM | POA: Diagnosis not present

## 2017-08-20 DIAGNOSIS — K089 Disorder of teeth and supporting structures, unspecified: Secondary | ICD-10-CM | POA: Insufficient documentation

## 2017-08-20 HISTORY — DX: Disorder of teeth and supporting structures, unspecified: K08.9

## 2017-08-20 MED ORDER — PENICILLIN V POTASSIUM 500 MG PO TABS: 500.0000 mg | ORAL_TABLET | Freq: Four times a day (QID) | ORAL | 1 refills | Status: DC

## 2017-08-20 NOTE — Telephone Encounter (Signed)
Would you recommend she see dr Meda Coffee this pm?

## 2017-08-20 NOTE — Telephone Encounter (Signed)
Patient called stating she is having a sore throat and ear pain. When can this patient be scheduled?  [phone]

## 2017-08-20 NOTE — Telephone Encounter (Signed)
Ok to schedule with Meda Coffee if opening

## 2017-08-20 NOTE — Progress Notes (Signed)
Chief Complaint  Patient presents with  . Dental Pain    left side  . Ear Pain    left side  . Sore Throat    does not think it is a cold   Patient states that she has pain in her left jaw, left ear, left side of her throat this been progressively worsening for about a 3-week period of time.  No fever or chills.  No runny nose or sinus congestion.  She does states she sometimes has some postnasal drip and cough but she thinks this is from her allergies. It is obvious looking at her that she has some facial swelling on the left side of her jaw.  She also has very poor dentition with only 2 remaining teeth in the left lower jaw.  I asked her if she was having dental pain and she said that she was, and having increased difficulty eating and chewing with these teeth.  Patient Active Problem List   Diagnosis Date Noted  . Poor dentition 08/20/2017  . Allergic rhinitis 06/13/2017  . Chronic combined systolic and diastolic CHF (congestive heart failure) ([place]) 03/30/2017  . Sleep apnea 12/17/2016  . Solitary thyroid nodule 03/01/2015  . Seasonal allergies 04/24/2014  . Hyperlipidemia LDL goal <100 05/29/2013  . Non-compliant behavior 05/29/2013  . Heart murmur 04/22/2011  . Morbid obesity 03/31/2008  . HTN (hypertension), malignant 03/31/2008  . DYSPEPSIA 03/31/2008    Outpatient Encounter Prescriptions as of 08/20/2017  Medication Sig  . ASPIRIN LOW DOSE 81 MG EC tablet TAKE 1 TABLET BY MOUTH DAILY  . atorvastatin (LIPITOR) 40 MG tablet TAKE 1 TABLET BY MOUTH DAILY AT 6PM  . carvedilol (COREG) 6.25 MG tablet Take 1 tablet (6.25 mg total) by mouth 2 (two) times daily.  . furosemide (LASIX) 40 MG tablet Take 1 tablet (40 mg total) by mouth daily.  . montelukast (SINGULAIR) 10 MG tablet Take 1 tablet (10 mg total) by mouth at bedtime.  . ranitidine (ZANTAC) 300 MG tablet Take 1 tablet (300 mg total) by mouth at bedtime.  . ranitidine (ZANTAC) 300 MG tablet Take 1 tablet (300 mg total)  by mouth at bedtime.  . sacubitril-valsartan (ENTRESTO) 49-51 MG Take 1 tablet by mouth 2 (two) times daily. Please bubble pack  . triamterene-hydrochlorothiazide (MAXZIDE) 75-50 MG tablet TAKE 1 TABLET BY MOUTH DAILY  . penicillin v potassium (VEETID) 500 MG tablet Take 1 tablet (500 mg total) by mouth 4 (four) times daily.   No facility-administered encounter medications on file as of 08/20/2017.     No Known Allergies  Review of Systems  Constitutional: Negative for activity change, appetite change, chills, fever and unexpected weight change.  HENT: Positive for dental problem, ear pain and sore throat. Negative for sinus pain, sinus pressure and trouble swallowing.   Eyes: Negative for redness and visual disturbance.  Respiratory: Negative for cough and shortness of breath.   Cardiovascular: Negative for chest pain, palpitations and leg swelling.  Gastrointestinal: Negative for nausea.  Allergic/Immunologic: Positive for environmental allergies. Negative for food allergies.  Hematological: Positive for adenopathy. Does not bruise/bleed easily.    BP 128/76 (BP Location: Left Arm, Patient Position: Sitting, Cuff Size: Normal)   Pulse 81   Temp 98.3 F (36.8 C) (Other (Comment))   Resp 16   Ht 5\' 2"  (1.575 m)   Wt 218 lb 8 oz (99.1 kg)   SpO2 99%   BMI 39.96 kg/m   Physical Exam  Constitutional: She is  oriented to person, place, and time. She appears well-developed and well-nourished. No distress.  HENT:  Head: Normocephalic and atraumatic.    Right Ear: External ear normal.  Left Ear: External ear normal.  Nose: Nose normal.  Mouth/Throat: Oropharynx is clear and moist. No oropharyngeal exudate.    Eyes: Pupils are equal, round, and reactive to light. Conjunctivae are normal.  Neck: Normal range of motion.  Tender left node at angle of jaw  Cardiovascular: Normal rate and regular rhythm.   Murmur heard. Click  Pulmonary/Chest: Effort normal and breath sounds  normal. She has no rales.  Lymphadenopathy:    She has cervical adenopathy.  Neurological: She is alert and oriented to person, place, and time.  Psychiatric: Her behavior is normal. Thought content normal.  Labile, moderately anxious    ASSESSMENT/PLAN:  1. Dental abscess - penicillin v potassium (VEETID) 500 MG tablet; Take 1 tablet (500 mg total) by mouth 4 (four) times daily.  Dispense: 40 tablet; Refill: 1  2. Poor dentition Discussed with patient that these teeth must come out.  They are starting to cause her medical problems.  I explained to her that the poor dentition, chronic infection, and periodontal disease that was placing her at increased risk for health complications.  She started to cry, and appears fearful about having these teeth removed.  She complains that she cannot have the removed because of her heart.  I told her that I would contact her cardiologist for clearance prior to proposed extraction.  I told her that is loose as they are, she would need very little anesthesia to remove them.   Patient Instructions  Soft foods Take the antibiotic as directed Make plans to have teeth removed    Raylene Everts, MD

## 2017-08-20 NOTE — Patient Instructions (Signed)
Soft foods Take the antibiotic as directed Make plans to have teeth removed

## 2017-08-20 NOTE — Telephone Encounter (Signed)
Absolutely yes if Dr Meda Coffee is available

## 2017-08-20 NOTE — Telephone Encounter (Signed)
Patient is scheduled to see Dr Meda Coffee today at 4:00. Patient is aware.

## 2017-10-22 ENCOUNTER — Encounter: Payer: Self-pay | Admitting: Family Medicine

## 2017-10-22 ENCOUNTER — Ambulatory Visit (INDEPENDENT_AMBULATORY_CARE_PROVIDER_SITE_OTHER): Payer: BLUE CROSS/BLUE SHIELD | Admitting: Family Medicine

## 2017-10-22 VITALS — BP 144/92 | HR 81 | Resp 16 | Ht 62.0 in | Wt 222.0 lb

## 2017-10-22 DIAGNOSIS — I10 Essential (primary) hypertension: Secondary | ICD-10-CM

## 2017-10-22 DIAGNOSIS — Z23 Encounter for immunization: Secondary | ICD-10-CM

## 2017-10-22 DIAGNOSIS — E785 Hyperlipidemia, unspecified: Secondary | ICD-10-CM | POA: Diagnosis not present

## 2017-10-22 DIAGNOSIS — I5042 Chronic combined systolic (congestive) and diastolic (congestive) heart failure: Secondary | ICD-10-CM | POA: Diagnosis not present

## 2017-10-22 DIAGNOSIS — K089 Disorder of teeth and supporting structures, unspecified: Secondary | ICD-10-CM

## 2017-10-22 LAB — COMPLETE METABOLIC PANEL WITH GFR
AG RATIO: 0.8 (calc) — AB (ref 1.0–2.5)
ALT: 12 U/L (ref 6–29)
AST: 15 U/L (ref 10–35)
Albumin: 3.8 g/dL (ref 3.6–5.1)
Alkaline phosphatase (APISO): 70 U/L (ref 33–130)
BILIRUBIN TOTAL: 0.5 mg/dL (ref 0.2–1.2)
BUN: 12 mg/dL (ref 7–25)
CHLORIDE: 99 mmol/L (ref 98–110)
CO2: 36 mmol/L — ABNORMAL HIGH (ref 20–32)
Calcium: 9.1 mg/dL (ref 8.6–10.4)
Creat: 0.9 mg/dL (ref 0.50–1.05)
GFR, EST AFRICAN AMERICAN: 84 mL/min/{1.73_m2} (ref >=60)
GFR, Est Non African American: 72 mL/min/{1.73_m2} (ref >=60)
Globulin: 4.5 g/dL (calc) — ABNORMAL HIGH (ref 1.9–3.7)
Glucose, Bld: 69 mg/dL (ref 65–139)
POTASSIUM: 3.9 mmol/L (ref 3.5–5.3)
Sodium: 138 mmol/L (ref 135–146)
TOTAL PROTEIN: 8.3 g/dL — AB (ref 6.1–8.1)

## 2017-10-22 LAB — LIPID PANEL
CHOLESTEROL: 185 mg/dL (ref <200)
HDL: 61 mg/dL (ref >50)
LDL Cholesterol (Calc): 108 mg/dL (calc) — ABNORMAL HIGH
Non-HDL Cholesterol (Calc): 124 mg/dL (calc) (ref <130)
TRIGLYCERIDES: 73 mg/dL (ref <150)
Total CHOL/HDL Ratio: 3 (calc) (ref <5.0)

## 2017-10-22 NOTE — Patient Instructions (Signed)
F/u in 4 months, call if you nee me before,   Flu vaccine today    Labs today lipid, cmp and eGFR   Please  Go to radiology dept to schedule mammogram today   Please call and arrange your colonoscopy the letter is printed  Work on food choice, stop pepsi and chips, change to water , fruit and vegetable  It is important that you exercise regularly at least 30 minutes 5 times a week. If you develop chest pain, have severe difficulty breathing, or feel very tired, stop exercising immediately and seek medical attention

## 2017-10-23 ENCOUNTER — Other Ambulatory Visit: Payer: Self-pay | Admitting: Family Medicine

## 2017-10-25 ENCOUNTER — Encounter: Payer: Self-pay | Admitting: Family Medicine

## 2017-10-25 NOTE — Assessment & Plan Note (Signed)
Deteriorated. Patient re-educated about  the importance of commitment to a  minimum of 150 minutes of exercise per week.  The importance of healthy food choices with portion control discussed. Encouraged to start a food diary, count calories and to consider  joining a support group. Sample diet sheets offered. Goals set by the patient for the next several months.   Weight /BMI 10/22/2017 08/20/2017 07/21/2017  WEIGHT 222 lb 218 lb 8 oz 215 lb  HEIGHT 5\' 2"  5\' 2"  5\' 2"   BMI 40.6 kg/m2 39.96 kg/m2 39.32 kg/m2

## 2017-10-25 NOTE — Assessment & Plan Note (Signed)
No current decompensation, continue current meds

## 2017-10-25 NOTE — Assessment & Plan Note (Signed)
Hyperlipidemia:Low fat diet discussed and encouraged.   Lipid Panel  Lab Results  Component Value Date   CHOL 185 10/22/2017   HDL 61 10/22/2017   LDLCALC 117 (H) 01/15/2017   TRIG 73 10/22/2017   CHOLHDL 3.0 10/22/2017    Updated lab needed at/ before next visit.

## 2017-10-25 NOTE — Progress Notes (Signed)
Michelle David     MRN: [id number]      DOB: 1961-03-26   HPI Michelle David is here for follow up and re-evaluation of chronic medical conditions, medication management and review of any available recent lab and radiology data.  Preventive health is updated, specifically  Cancer screening and Immunization.   Questions or concerns regarding consultations or procedures which the PT has had in the interim are  addressed. The PT denies any adverse reactions to current medications since the last visit.  There are no new concerns.  There are no specific complaints   ROS Denies recent fever or chills. Denies sinus pressure, nasal congestion, ear pain or sore throat. Denies chest congestion, productive cough or wheezing. Denies chest pains, palpitations and leg swelling Denies abdominal pain, nausea, vomiting,diarrhea or constipation.   Denies dysuria, frequency, hesitancy or incontinence. Denies joint pain, swelling and limitation in mobility. Denies headaches, seizures, numbness, or tingling. Denies depression, anxiety or insomnia. Denies skin break down or rash.   PE  BP (!) 144/92   Pulse 81   Resp 16   Ht 5\' 2"  (1.575 m)   Wt 222 lb (100.7 kg)   SpO2 97%   BMI 40.60 kg/m   Patient alert and oriented and in no cardiopulmonary distress.  HEENT: No facial asymmetry, EOMI,   oropharynx pink and moist.  Neck supple no JVD, no mass.  Chest: Clear to auscultation bilaterally.  CVS: S1, S2 no murmurs, no S3.Regular rate.  ABD: Soft non tender.   Ext: No edema  MS: Adequate ROM spine, shoulders, hips and knees.  Skin: Intact, no ulcerations or rash noted.  Psych: Good eye contact, normal affect. Memory intact not anxious or depressed appearing.  CNS: CN 2-12 intact, power,  normal throughout.no focal deficits noted.   Assessment & Plan  HTN (hypertension), malignant Not at goal , no med change , pt to work on lifestyle change DASH diet and commitment to daily  physical activity for a minimum of 30 minutes discussed and encouraged, as a part of hypertension management. The importance of attaining a healthy weight is also discussed.  BP/Weight 10/22/2017 08/20/2017 07/21/2017 06/08/2017 06/08/2017 05/14/2017 01/13/7123  Systolic BP 580 998 338 250 539 767 341  Diastolic BP 92 76 78 80 80 98 88  Wt. (Lbs) 222 218.5 215 210 208 - 214  BMI 40.6 39.96 39.32 38.41 38.04 - 39.14       Morbid obesity Deteriorated. Patient re-educated about  the importance of commitment to a  minimum of 150 minutes of exercise per week.  The importance of healthy food choices with portion control discussed. Encouraged to start a food diary, count calories and to consider  joining a support group. Sample diet sheets offered. Goals set by the patient for the next several months.   Weight /BMI 10/22/2017 08/20/2017 07/21/2017  WEIGHT 222 lb 218 lb 8 oz 215 lb  HEIGHT 5\' 2"  5\' 2"  5\' 2"   BMI 40.6 kg/m2 39.96 kg/m2 39.32 kg/m2      Hyperlipidemia LDL goal <100 Hyperlipidemia:Low fat diet discussed and encouraged.   Lipid Panel  Lab Results  Component Value Date   CHOL 185 10/22/2017   HDL 61 10/22/2017   LDLCALC 117 (H) 01/15/2017   TRIG 73 10/22/2017   CHOLHDL 3.0 10/22/2017    Updated lab needed at/ before next visit.   Chronic combined systolic and diastolic CHF (congestive heart failure) (HCC) No current decompensation, continue current meds  Poor dentition Still  getting extractions completed

## 2017-10-25 NOTE — Assessment & Plan Note (Signed)
Not at goal , no med change , pt to work on lifestyle change DASH diet and commitment to daily physical activity for a minimum of 30 minutes discussed and encouraged, as a part of hypertension management. The importance of attaining a healthy weight is also discussed.  BP/Weight 10/22/2017 08/20/2017 07/21/2017 06/08/2017 06/08/2017 05/14/2017 5/68/1275  Systolic BP 170 017 494 496 759 163 846  Diastolic BP 92 76 78 80 80 98 88  Wt. (Lbs) 222 218.5 215 210 208 - 214  BMI 40.6 39.96 39.32 38.41 38.04 - 39.14

## 2017-10-25 NOTE — Assessment & Plan Note (Signed)
Still getting extractions completed

## 2017-10-27 ENCOUNTER — Other Ambulatory Visit: Payer: Self-pay | Admitting: Family Medicine

## 2017-10-27 ENCOUNTER — Encounter: Payer: Self-pay | Admitting: Family Medicine

## 2017-10-27 DIAGNOSIS — Z1231 Encounter for screening mammogram for malignant neoplasm of breast: Secondary | ICD-10-CM

## 2017-11-02 ENCOUNTER — Ambulatory Visit (HOSPITAL_COMMUNITY)
Admission: RE | Admit: 2017-11-02 | Discharge: 2017-11-02 | Disposition: A | Discharge status: Home / Self Care | Payer: BLUE CROSS/BLUE SHIELD | Source: Ambulatory Visit | Attending: Family Medicine | Admitting: Family Medicine

## 2017-11-02 DIAGNOSIS — Z1231 Encounter for screening mammogram for malignant neoplasm of breast: Secondary | ICD-10-CM | POA: Insufficient documentation

## 2017-11-24 ENCOUNTER — Other Ambulatory Visit: Payer: Self-pay | Admitting: Family Medicine

## 2017-11-24 ENCOUNTER — Other Ambulatory Visit: Payer: Self-pay | Admitting: Adult Health

## 2017-11-24 DIAGNOSIS — I10 Essential (primary) hypertension: Secondary | ICD-10-CM

## 2018-01-18 ENCOUNTER — Encounter: Payer: Self-pay | Admitting: Family Medicine

## 2018-01-18 ENCOUNTER — Ambulatory Visit (INDEPENDENT_AMBULATORY_CARE_PROVIDER_SITE_OTHER): Payer: BLUE CROSS/BLUE SHIELD | Admitting: Family Medicine

## 2018-01-18 VITALS — BP 170/84 | HR 71 | Resp 16 | Ht 62.0 in | Wt 222.0 lb

## 2018-01-18 DIAGNOSIS — I10 Essential (primary) hypertension: Secondary | ICD-10-CM | POA: Diagnosis not present

## 2018-01-18 DIAGNOSIS — I5042 Chronic combined systolic (congestive) and diastolic (congestive) heart failure: Secondary | ICD-10-CM

## 2018-01-18 DIAGNOSIS — J302 Other seasonal allergic rhinitis: Secondary | ICD-10-CM | POA: Diagnosis not present

## 2018-01-18 DIAGNOSIS — Z1211 Encounter for screening for malignant neoplasm of colon: Secondary | ICD-10-CM

## 2018-01-18 DIAGNOSIS — M170 Bilateral primary osteoarthritis of knee: Secondary | ICD-10-CM

## 2018-01-18 DIAGNOSIS — R0683 Snoring: Secondary | ICD-10-CM | POA: Diagnosis not present

## 2018-01-18 NOTE — Patient Instructions (Addendum)
F/U ini 5 months, call if you need me before  Fasting lipid, cmp and EGFR, TSH and CBC 1 week before next visit  Please work best you can on getting the entresto, need that for your hearty and blood pressure   Very important sleep study and treat sleep apnea  Colonoscopy  Is past due  More natural foods, less salt, water, no artificial sweeteners and sodas, we CAN do this!!  Sorry about the losses in your family, please reach out for help to those you trust and depend on   It is important that you exercise regularly at least 30 minutes 5 times a week. If you develop chest pain, have severe difficulty breathing, or feel very tired, stop exercising immediately and seek medical attention    Please work on 8 pound weight loss

## 2018-01-18 NOTE — Progress Notes (Signed)
Michelle David     MRN: [id number]      DOB: 08/21/1961   HPI Michelle David is here for follow up and re-evaluation of chronic medical conditions, medication management and review of any available recent lab and radiology data.  Preventive health is updated, seeds colonoscopy and o is now ready  Questions or concerns regarding consultations or procedures which the PT has had in the interim are  addressed. Financial difficulty affording entestro, and her BP is high, also this medication si vital for management of her heart failure  C/o increased knee pain and stiffness especially when first arising, she has not fallen C/o chronic fatigue and states spouse c/o fact that she often snores loudly and he has to shake her when she appears to stop breathing, history and body habitus are c/w OSA, she is being referred for evaluation and management   ROS Denies recent fever or chills. Denies sinus pressure, nasal congestion, ear pain or sore throat. Denies chest congestion, productive cough or wheezing. Denies chest pains, palpitations and leg swelling, denies pND or orthopnea Denies abdominal pain, nausea, vomiting,diarrhea or constipation.   Denies dysuria, frequency, hesitancy or incontinence. Denies headaches, seizures, numbness, or tingling. Denies depression, anxiety or insomnia. Denies skin break down or rash.   PE  BP (!) 170/84   Pulse 71   Resp 16   Ht 5\' 2"  (1.575 m)   Wt 222 lb (100.7 kg)   SpO2 98%   BMI 40.60 kg/m   Patient alert and oriented and in no cardiopulmonary distress.  HEENT: No facial asymmetry, EOMI,   oropharynx pink and moist.  Neck supple no JVD, no mass.  Chest: Clear to auscultation bilaterally.  CVS: S1, S2 no murmurs, no S3.Regular rate.  ABD: Soft non tender.   Ext: No edema  MS: Adequate ROM spine, shoulders, hips and reduced in  knees.  Skin: Intact, no ulcerations or rash noted.  Psych: Good eye contact, normal affect. Memory intact not  anxious or depressed appearing.  CNS: CN 2-12 intact, power,  normal throughout.no focal deficits noted.   Assessment & Plan  HTN (hypertension), malignant Uncontrolled reports that she is unable to afford her nediation we  Are looking at helping her with patient assistance DASH diet and commitment to daily physical activity for a minimum of 30 minutes discussed and encouraged, as a part of hypertension management. The importance of attaining a healthy weight is also discussed.  BP/Weight 01/18/2018 10/22/2017 08/20/2017 07/21/2017 06/08/2017 06/08/2017 1/93/7902  Systolic BP 409 735 329 924 268 341 962  Diastolic BP 84 92 76 78 80 80 98  Wt. (Lbs) 222 222 218.5 215 210 208 -  BMI 40.6 40.6 39.96 39.32 38.41 38.04 -       Chronic combined systolic and diastolic CHF (congestive heart failure) (HCC) Currently asymptomatic and has not decompensated  Morbid obesity Deteriorated. Patient re-educated about  the importance of commitment to a  minimum of 150 minutes of exercise per week.  The importance of healthy food choices with portion control discussed. Encouraged to start a food diary, count calories and to consider  joining a support group. Sample diet sheets offered. Goals set by the patient for the next several months.   Weight /BMI 01/18/2018 10/22/2017 08/20/2017  WEIGHT 222 lb 222 lb 218 lb 8 oz  HEIGHT 5\' 2"  5\' 2"  5\' 2"   BMI 40.6 kg/m2 40.6 kg/m2 39.96 kg/m2      Seasonal allergies Increased symptoms inSpring as usual,  pt will commit to regular daily medication which is benficial  Osteoarthritis of both knees Weight loss and strengthening exercises recommended

## 2018-01-22 ENCOUNTER — Encounter: Payer: Self-pay | Admitting: Family Medicine

## 2018-01-22 DIAGNOSIS — M170 Bilateral primary osteoarthritis of knee: Secondary | ICD-10-CM | POA: Insufficient documentation

## 2018-01-22 DIAGNOSIS — R0683 Snoring: Secondary | ICD-10-CM

## 2018-01-22 HISTORY — DX: Bilateral primary osteoarthritis of knee: M17.0

## 2018-01-22 HISTORY — DX: Snoring: R06.83

## 2018-01-22 NOTE — Assessment & Plan Note (Signed)
Increased symptoms inSpring as usual, pt will commit to regular daily medication which is benficial

## 2018-01-22 NOTE — Assessment & Plan Note (Signed)
Currently asymptomatic and has not decompensated

## 2018-01-22 NOTE — Assessment & Plan Note (Signed)
Deteriorated. Patient re-educated about  the importance of commitment to a  minimum of 150 minutes of exercise per week.  The importance of healthy food choices with portion control discussed. Encouraged to start a food diary, count calories and to consider  joining a support group. Sample diet sheets offered. Goals set by the patient for the next several months.   Weight /BMI 01/18/2018 10/22/2017 08/20/2017  WEIGHT 222 lb 222 lb 218 lb 8 oz  HEIGHT 5\' 2"  5\' 2"  5\' 2"   BMI 40.6 kg/m2 40.6 kg/m2 39.96 kg/m2

## 2018-01-22 NOTE — Assessment & Plan Note (Signed)
Excessive snoring and apneic spells, likely she has OSA, needs evaluation and management will refer for formal testing

## 2018-01-22 NOTE — Assessment & Plan Note (Signed)
Uncontrolled reports that she is unable to afford her nediation we  Are looking at helping her with patient assistance DASH diet and commitment to daily physical activity for a minimum of 30 minutes discussed and encouraged, as a part of hypertension management. The importance of attaining a healthy weight is also discussed.  BP/Weight 01/18/2018 10/22/2017 08/20/2017 07/21/2017 06/08/2017 06/08/2017 8/84/1660  Systolic BP 630 160 109 323 557 322 025  Diastolic BP 84 92 76 78 80 80 98  Wt. (Lbs) 222 222 218.5 215 210 208 -  BMI 40.6 40.6 39.96 39.32 38.41 38.04 -

## 2018-01-22 NOTE — Assessment & Plan Note (Signed)
Weight loss and strengthening exercises recommended

## 2018-02-10 ENCOUNTER — Ambulatory Visit (INDEPENDENT_AMBULATORY_CARE_PROVIDER_SITE_OTHER): Payer: Self-pay

## 2018-02-10 DIAGNOSIS — Z1211 Encounter for screening for malignant neoplasm of colon: Secondary | ICD-10-CM

## 2018-02-10 MED ORDER — PEG 3350-KCL-NA BICARB-NACL 420 G PO SOLR: 4000.0000 mL | ORAL | 0 refills | Status: DC

## 2018-02-10 NOTE — Progress Notes (Signed)
Gastroenterology Pre-Procedure Review  Request Date:02/10/18 Requesting Physician: Dr.Simpson (no previous tcs)  PATIENT REVIEW QUESTIONS: The patient responded to the following health history questions as indicated:    1. Diabetes Melitis: no 2. Joint replacements in the past 12 months: no 3. Major health problems in the past 3 months: no 4. Has an artificial valve or MVP: no 5. Has a defibrillator: no 6. Has been advised in past to take antibiotics in advance of a procedure like teeth cleaning: no 7. Family history of colon cancer: no  8. Alcohol Use: no 9. History of sleep apnea: no  10. History of coronary artery or other vascular stents placed within the last 12 months: no 11. History of any prior anesthesia complications: no    MEDICATIONS & ALLERGIES:    Patient reports the following regarding taking any blood thinners:   Plavix? no Aspirin? yes (81mg ) Coumadin? no Brilinta? no Xarelto? no Eliquis? no Pradaxa? no Savaysa? no Effient? no  Patient confirms/reports the following medications:  Current Outpatient Medications  Medication Sig Dispense Refill  . ASPIRIN LOW DOSE 81 MG EC tablet TAKE 1 TABLET BY MOUTH DAILY 30 tablet 6  . atorvastatin (LIPITOR) 40 MG tablet TAKE 1 TABLET BY MOUTH DAILY AT 6PM 90 tablet 3  . carvedilol (COREG) 6.25 MG tablet Take 1 tablet (6.25 mg total) by mouth 2 (two) times daily. 180 tablet 3  . furosemide (LASIX) 40 MG tablet Take 1 tablet (40 mg total) by mouth daily. 90 tablet 3  . montelukast (SINGULAIR) 10 MG tablet TAKE (1) TABLET BY MOUTH AT BEDTIME. 30 tablet 0  . ranitidine (ZANTAC) 300 MG tablet Take 1 tablet (300 mg total) by mouth at bedtime. 30 tablet 5  . triamterene-hydrochlorothiazide (MAXZIDE) 75-50 MG tablet TAKE 1 TABLET BY MOUTH ONCE A DAY. 30 tablet 0  . sacubitril-valsartan (ENTRESTO) 49-51 MG Take 1 tablet by mouth 2 (two) times daily. Please bubble pack (Patient not taking: Reported on 02/10/2018) 60 tablet 11   No  current facility-administered medications for this visit.     Patient confirms/reports the following allergies:  No Known Allergies  No orders of the defined types were placed in this encounter.   AUTHORIZATION INFORMATION Primary Insurance:BCBS [place] ,  ID #: [id number] Pre-Cert / Auth required: no  SCHEDULE INFORMATION: Procedure has been scheduled as follows:  Date: 02/10/18, Time: 2:00  Location: [facility]  This Gastroenterology Pre-Precedure Review Form is being routed to the following provider(s): Roseanne Kaufman NP

## 2018-02-10 NOTE — Patient Instructions (Signed)
Michelle David   1961-05-23 MRN: [id number]    Procedure Date: 03/19/18 Time to register: 1:00pm Place to register: [facility] Procedure Time: 2:00pm Scheduled provider: Barney Drain, MD  PREPARATION FOR COLONOSCOPY WITH TRI-LYTE SPLIT PREP  Please notify us immediately if you are diabetic, take iron supplements, or if you are on Coumadin or any other blood thinners.     You will need to purchase 1 fleet enema and 1 box of Bisacodyl 16m tablets.    1 DAY BEFORE PROCEDURE:  DATE: 03/18/18   DAY: Thursday Continue clear liquids the entire day - NO SOLID FOOD.     At 2:00 pm:  Take 2 Bisacodyl tablets.   At 4:00pm:  Start drinking your solution. Make sure you mix well per instructions on the bottle. Try to drink 1 (one) 8 ounce glass every 10-15 minutes until you have consumed HALF the jug. You should complete by 6:00pm.You must keep the left over solution refrigerated until completed next day.  Continue clear liquids. You must drink plenty of clear liquids to prevent dehyration and kidney failure. Nothing to eat or drink after midnight.  EXCEPTION: If you take medications for your heart, blood pressure or breathing, you may take these medications with a small amount of clear liquid.    DAY OF PROCEDURE:   DATE: 03/19/18   DAY: Friday    Five hours before your procedure time @ 9:00am:  Finish remaining amout of bowel prep, drinking 1 (one) 8 ounce glass every 10-15 minutes until complete. You have two hours to consume remaining prep.   Three hours before your procedure time _0 :00am:  Nothing by mouth.   At least one hour before going to the hospital:  Give yourself one Fleet enema. You may take your morning medications with sip of water unless we have instructed otherwise.      Please see below for Dietary Information.  CLEAR LIQUIDS INCLUDE:  Water Jello (NOT red in color)   Ice Popsicles (NOT red in color)   Tea (sugar ok, no milk/cream) Powdered fruit  flavored drinks  Coffee (sugar ok, no milk/cream) Gatorade/ Lemonade/ Kool-Aid  (NOT red in color)   Juice: apple, white grape, white cranberry Soft drinks  Clear bullion, consomme, broth (fat free beef/chicken/vegetable)  Carbonated beverages (any kind)  Strained chicken noodle soup Hard Candy   Remember: Clear liquids are liquids that will allow you to see your fingers on the other side of a clear glass. Be sure liquids are NOT red in color, and not cloudy, but CLEAR.  DO NOT EAT OR DRINK ANY OF THE FOLLOWING:  Dairy products of any kind   Cranberry juice Tomato juice / V8 juice   Grapefruit juice Orange juice     Red grape juice  Do not eat any solid foods, including such foods as: cereal, oatmeal, yogurt, fruits, vegetables, creamed soups, eggs, bread, crackers, pureed foods in a blender, etc.   HELPFUL HINTS FOR DRINKING PREP SOLUTION:   Make sure prep is extremely cold. Mix and refrigerate the the morning of the prep. You may also put in the freezer.   You may try mixing some Crystal Light or Country Time Lemonade if you prefer. Mix in small amounts; add more if necessary.  Try drinking through a straw  Rinse mouth with water or a mouthwash between glasses, to remove after-taste.  Try sipping on a cold beverage /ice/ popsicles between glasses of prep.  Place a piece of sugar-free hard candy  in mouth between glasses.  If you become nauseated, try consuming smaller amounts, or stretch out the time between glasses. Stop for 30-60 minutes, then slowly start back drinking.        OTHER INSTRUCTIONS  You will need a responsible adult at least 55 years of age to accompany you and drive you home. This person must remain in the waiting room during your procedure. The hospital will cancel your procedure if you do not have a responsible adult with you.   1. Wear loose fitting clothing that is easily removed. 2. Leave jewelry and other valuables at home.  3. Remove all body  piercing jewelry and leave at home. 4. Total time from sign-in until discharge is approximately 2-3 hours. 5. You should go home directly after your procedure and rest. You can resume normal activities the day after your procedure. 6. The day of your procedure you should not:  Drive  Make legal decisions  Operate machinery  Drink alcohol  Return to work   You may call the office (Dept: [phone]) before 5:00pm, or page the doctor on call [phone]) after 5:00pm, for further instructions, if necessary.   Insurance Information YOU WILL NEED TO CHECK WITH YOUR INSURANCE COMPANY FOR THE BENEFITS OF COVERAGE YOU HAVE FOR THIS PROCEDURE.  UNFORTUNATELY, NOT ALL INSURANCE COMPANIES HAVE BENEFITS TO COVER ALL OR PART OF THESE TYPES OF PROCEDURES.  IT IS YOUR RESPONSIBILITY TO CHECK YOUR BENEFITS, HOWEVER, WE WILL BE GLAD TO ASSIST YOU WITH ANY CODES YOUR INSURANCE COMPANY MAY NEED.    PLEASE NOTE THAT MOST INSURANCE COMPANIES WILL NOT COVER A SCREENING COLONOSCOPY FOR PEOPLE UNDER THE AGE OF 50  IF YOU HAVE BCBS INSURANCE, YOU MAY HAVE BENEFITS FOR A SCREENING COLONOSCOPY BUT IF POLYPS ARE FOUND THE DIAGNOSIS WILL CHANGE AND THEN YOU MAY HAVE A DEDUCTIBLE THAT WILL NEED TO BE MET. SO PLEASE MAKE SURE YOU CHECK YOUR BENEFITS FOR A SCREENING COLONOSCOPY AS WELL AS A DIAGNOSTIC COLONOSCOPY.

## 2018-02-10 NOTE — Progress Notes (Signed)
Pt has her medication "bubble packed" by her pharmacy- her morning medications are : ASA, carvedilol, lasix, HCTZ/ triam. She is not sure which medication is which in the packaging. Is it ok for her to take all of these the morning of her procedure?

## 2018-02-15 NOTE — Progress Notes (Signed)
Appropriate. May take morning meds with sips of water.

## 2018-02-15 NOTE — Progress Notes (Signed)
Letter mailed to the pt. 

## 2018-03-19 ENCOUNTER — Encounter (HOSPITAL_COMMUNITY)
Admission: RE | Disposition: A | Discharge status: Home / Self Care | Payer: Self-pay | Source: Ambulatory Visit | Attending: Gastroenterology

## 2018-03-19 ENCOUNTER — Other Ambulatory Visit: Payer: Self-pay

## 2018-03-19 ENCOUNTER — Ambulatory Visit (HOSPITAL_COMMUNITY)
Admission: RE | Admit: 2018-03-19 | Discharge: 2018-03-19 | Disposition: A | Discharge status: Home / Self Care | Payer: BLUE CROSS/BLUE SHIELD | Source: Ambulatory Visit | Attending: Gastroenterology | Admitting: Gastroenterology

## 2018-03-19 ENCOUNTER — Encounter (HOSPITAL_COMMUNITY): Payer: Self-pay | Admitting: *Deleted

## 2018-03-19 DIAGNOSIS — D123 Benign neoplasm of transverse colon: Secondary | ICD-10-CM | POA: Diagnosis not present

## 2018-03-19 DIAGNOSIS — I509 Heart failure, unspecified: Secondary | ICD-10-CM | POA: Diagnosis not present

## 2018-03-19 DIAGNOSIS — I110 Hypertensive heart disease with heart failure: Secondary | ICD-10-CM | POA: Diagnosis not present

## 2018-03-19 DIAGNOSIS — Z79899 Other long term (current) drug therapy: Secondary | ICD-10-CM | POA: Diagnosis not present

## 2018-03-19 DIAGNOSIS — Z8249 Family history of ischemic heart disease and other diseases of the circulatory system: Secondary | ICD-10-CM | POA: Insufficient documentation

## 2018-03-19 DIAGNOSIS — E669 Obesity, unspecified: Secondary | ICD-10-CM | POA: Insufficient documentation

## 2018-03-19 DIAGNOSIS — D128 Benign neoplasm of rectum: Secondary | ICD-10-CM

## 2018-03-19 DIAGNOSIS — K644 Residual hemorrhoidal skin tags: Secondary | ICD-10-CM | POA: Diagnosis not present

## 2018-03-19 DIAGNOSIS — Z6841 Body Mass Index (BMI) 40.0 and over, adult: Secondary | ICD-10-CM | POA: Diagnosis not present

## 2018-03-19 DIAGNOSIS — Q438 Other specified congenital malformations of intestine: Secondary | ICD-10-CM | POA: Diagnosis not present

## 2018-03-19 DIAGNOSIS — I429 Cardiomyopathy, unspecified: Secondary | ICD-10-CM | POA: Diagnosis not present

## 2018-03-19 DIAGNOSIS — Z1211 Encounter for screening for malignant neoplasm of colon: Secondary | ICD-10-CM

## 2018-03-19 DIAGNOSIS — E785 Hyperlipidemia, unspecified: Secondary | ICD-10-CM | POA: Diagnosis not present

## 2018-03-19 DIAGNOSIS — Z7982 Long term (current) use of aspirin: Secondary | ICD-10-CM | POA: Insufficient documentation

## 2018-03-19 HISTORY — PX: COLONOSCOPY: SHX5424

## 2018-03-19 SURGERY — COLONOSCOPY
Anesthesia: Moderate Sedation

## 2018-03-19 MED ORDER — MIDAZOLAM HCL 5 MG/5ML IJ SOLN
INTRAMUSCULAR | Status: DC | PRN
  Administered 2018-03-19: 1 mg via INTRAVENOUS
  Administered 2018-03-19 (×2): 2 mg via INTRAVENOUS

## 2018-03-19 MED ORDER — MEPERIDINE HCL 100 MG/ML IJ SOLN
INTRAMUSCULAR | Status: AC
  Filled 2018-03-19: qty 2

## 2018-03-19 MED ORDER — ONDANSETRON HCL 4 MG/2ML IJ SOLN
INTRAMUSCULAR | Status: AC
  Filled 2018-03-19: qty 2

## 2018-03-19 MED ORDER — SODIUM CHLORIDE 0.9 % IV SOLN
INTRAVENOUS | Status: DC
  Administered 2018-03-19: 1000 mL via INTRAVENOUS

## 2018-03-19 MED ORDER — STERILE WATER FOR IRRIGATION IR SOLN
Status: DC | PRN
  Administered 2018-03-19: 2.5 mL

## 2018-03-19 MED ORDER — MEPERIDINE HCL 100 MG/ML IJ SOLN
INTRAMUSCULAR | Status: DC | PRN
  Administered 2018-03-19: 50 mg via INTRAVENOUS

## 2018-03-19 MED ORDER — MIDAZOLAM HCL 5 MG/5ML IJ SOLN
INTRAMUSCULAR | Status: AC
  Filled 2018-03-19: qty 10

## 2018-03-19 MED ORDER — ONDANSETRON HCL 4 MG/2ML IJ SOLN
INTRAMUSCULAR | Status: DC | PRN
  Administered 2018-03-19: 4 mg via INTRAVENOUS

## 2018-03-19 NOTE — Op Note (Signed)
[facility] Patient Name: Michelle David Procedure Date: 03/19/2018 2:08 PM MRN: [id number] Date of Birth: August 07, 1961 Attending MD: Barney Drain MD, MD CSN: [id number] Age: 57 Admit Type: Outpatient Procedure:                Colonoscopy WITH COLD FORCEPS & SNARE CAUTERY                            POLYPECTOMY Indications:              Screening for colorectal malignant neoplasm Providers:                Barney Drain MD, MD, Lurline Del, RN, Aram Candela Referring MD:             Norwood Levo. Simpson MD, MD Medicines:                Ondansetron 4 mg IV, Meperidine 50 mg IV, Midazolam                            5 mg IV Complications:            No immediate complications. Estimated Blood Loss:     Estimated blood loss was minimal. Procedure:                Pre-Anesthesia Assessment:                           - Prior to the procedure, a History and Physical                            was performed, and patient medications and                            allergies were reviewed. The patient's tolerance of                            previous anesthesia was also reviewed. The risks                            and benefits of the procedure and the sedation                            options and risks were discussed with the patient.                            All questions were answered, and informed consent                            was obtained. Prior Anticoagulants: The patient has                            taken aspirin, last dose was day of procedure. ASA                            Grade Assessment: II - A patient with mild systemic  disease. After reviewing the risks and benefits,                            the patient was deemed in satisfactory condition to                            undergo the procedure.                           After obtaining informed consent, the colonoscope                            was passed under direct vision. Throughout the                             procedure, the patient's blood pressure, pulse, and                            oxygen saturations were monitored continuously. The                            EC-[id number] ([id number]) scope was introduced through                            the anus and advanced to the the cecum, identified                            by appendiceal orifice and ileocecal valve. The                            colonoscopy was somewhat difficult due to a                            tortuous colon. Successful completion of the                            procedure was aided by increasing the dose of                            sedation medication, straightening and shortening                            the scope to obtain bowel loop reduction and                            COLOWRAP. The patient tolerated the procedure                            fairly well. The quality of the bowel preparation                            was good. The ileocecal valve, appendiceal orifice,  and rectum were photographed. Scope In: 2:35:50 PM Scope Out: 2:54:15 PM Scope Withdrawal Time: 0 hours 14 minutes 59 seconds  Total Procedure Duration: 0 hours 18 minutes 25 seconds  Findings:      A 9 mm polyp was found in the rectum. The polyp was sessile. The polyp       was removed with a hot snare. Resection and retrieval were complete.      Two sessile polyps were found in the rectum and proximal transverse       colon. The polyps were 2 to 3 mm in size. These polyps were removed with       a cold biopsy forceps. Resection and retrieval were complete.      The recto-sigmoid colon, sigmoid colon and descending colon were       moderately redundant.      External hemorrhoids were found. The hemorrhoids were small. Impression:               - One 9 mm polyp in the rectum, removed with a hot                            snare. Resected and retrieved.                           - Two 2 to 3 mm  polyps in the rectum and in the                            proximal transverse colon, removed with a cold                            biopsy forceps. Resected and retrieved.                           - Redundant LEFT colon.                           - External hemorrhoids. Moderate Sedation:      Moderate (conscious) sedation was administered by the endoscopy nurse       and supervised by the endoscopist. The following parameters were       monitored: oxygen saturation, heart rate, blood pressure, and response       to care. Total physician intraservice time was 31 minutes. Recommendation:           - Repeat colonoscopy in 3 years for surveillance.                           - High fiber diet.                           - Continue present medications.                           - Await pathology results.                           - Patient has a contact number available for  emergencies. The signs and symptoms of potential                            delayed complications were discussed with the                            patient. Return to normal activities tomorrow.                            Written discharge instructions were provided to the                            patient. Procedure Code(s):        --- Professional ---                           [phone], Colonoscopy, flexible; with removal of                            tumor(s), polyp(s), or other lesion(s) by snare                            technique                           45380, 59, Colonoscopy, flexible; with biopsy,                            single or multiple                           G0500, Moderate sedation services provided by the                            same physician or other qualified health care                            professional performing a gastrointestinal                            endoscopic service that sedation supports,                            requiring the presence of an  independent trained                            observer to assist in the monitoring of the                            patient's level of consciousness and physiological                            status; initial 15 minutes of intra-service time;                            patient age 13  years or older (additional time may                            be reported with [phone], as appropriate)                           [phone], Moderate sedation services provided by the                            same physician or other qualified health care                            professional performing the diagnostic or                            therapeutic service that the sedation supports,                            requiring the presence of an independent trained                            observer to assist in the monitoring of the                            patient's level of consciousness and physiological                            status; each additional 15 minutes intraservice                            time (List separately in addition to code for                            primary service) Diagnosis Code(s):        --- Professional ---                           Z12.11, Encounter for screening for malignant                            neoplasm of colon                           K62.1, Rectal polyp                           D12.3, Benign neoplasm of transverse colon (hepatic                            flexure or splenic flexure)                           K64.4, Residual hemorrhoidal skin tags                           Q43.8, Other specified congenital malformations of  intestine CPT copyright 2017 American Medical Association. All rights reserved. The codes documented in this report are preliminary and upon coder review may  be revised to meet current compliance requirements. Barney Drain, MD Barney Drain MD, MD 03/19/2018 9:43:43 PM This report has been signed  electronically. Number of Addenda: 0

## 2018-03-19 NOTE — H&P (Addendum)
Primary Care Physician:  Fayrene Helper, MD Primary Gastroenterologist:  Dr. Oneida Alar  Pre-Procedure History & Physical: HPI:  Michelle David is a 57 y.o. female here for [place].  Past Medical History:  Diagnosis Date  . Cardiomyopathy [facility])    Diagnosed March 2018  . CHF (congestive heart failure) ([place])   . Complication of anesthesia   . Dyspepsia   . Essential hypertension   . Hyperlipidemia   . Impaired glucose tolerance   . Obesity     Past Surgical History:  Procedure Laterality Date  . OOPHORECTOMY    . TUBAL LIGATION      Prior to Admission medications   Medication Sig Start Date End Date Taking? Authorizing Provider  ASPIRIN LOW DOSE 81 MG EC tablet TAKE 1 TABLET BY MOUTH DAILY 04/17/17  Yes Lendon Colonel, NP  atorvastatin (LIPITOR) 40 MG tablet TAKE 1 TABLET BY MOUTH DAILY AT 6PM Patient taking differently: Take 40 mg by mouth daily at 6 PM.  05/07/17  Yes Lendon Colonel, NP  carvedilol (COREG) 6.25 MG tablet Take 1 tablet (6.25 mg total) by mouth 2 (two) times daily. 05/07/17  Yes Lendon Colonel, NP  furosemide (LASIX) 40 MG tablet Take 1 tablet (40 mg total) by mouth daily. 05/07/17  Yes Lendon Colonel, NP  montelukast (SINGULAIR) 10 MG tablet TAKE (1) TABLET BY MOUTH AT BEDTIME. 11/26/17  Yes Fayrene Helper, MD  polyethylene glycol-electrolytes (TRILYTE) 420 g solution Take 4,000 mLs by mouth as directed. 02/10/18  Yes Annitta Needs, NP  ranitidine (ZANTAC) 300 MG tablet Take 1 tablet (300 mg total) by mouth at bedtime. 08/01/17  Yes Fayrene Helper, MD  triamterene-hydrochlorothiazide (MAXZIDE) 75-50 MG tablet TAKE 1 TABLET BY MOUTH ONCE A DAY. 11/24/17  Yes Satira Sark, MD  Hypromellose (ARTIFICIAL TEARS OP) Place 1 drop into both eyes daily as needed (for dry eyes).    [provider]  sacubitril-valsartan (ENTRESTO) 49-51 MG Take 1 tablet by mouth 2 (two) times daily. Please bubble pack Patient not  taking: Reported on 02/10/2018 05/07/17   Lendon Colonel, NP    Allergies as of 02/10/2018  . (No Known Allergies)    Family History  Problem Relation Age of Onset  . Heart disease Mother        Died in her 45s with MI  . Hypertension Mother   . Lung cancer Maternal Aunt     Social History   Socioeconomic History  . Marital status: Married    Spouse name: Not on file  . Number of children: Not on file  . Years of education: Not on file  . Highest education level: Not on file  Occupational History  . Occupation: Media planner: [employer]  . Financial resource strain: Not on file  . Food insecurity:    Worry: Not on file    Inability: Not on file  . Transportation needs:    Medical: Not on file    Non-medical: Not on file  Tobacco Use  . Smoking status: Never Smoker  . Smokeless tobacco: Never Used  Substance and Sexual Activity  . Alcohol use: Yes    Comment: special occasions  . Drug use: No  . Sexual activity: Not Currently  Lifestyle  . Physical activity:    Days per week: Not on file    Minutes per session: Not on file  . Stress: Not on file  Relationships  . Social connections:    Talks on phone: Not on file    Gets together: Not on file    Attends religious service: Not on file    Active member of club or organization: Not on file    Attends meetings of clubs or organizations: Not on file    Relationship status: Not on file  . Intimate partner violence:    Fear of current or ex partner: Not on file    Emotionally abused: Not on file    Physically abused: Not on file    Forced sexual activity: Not on file  Other Topics Concern  . Not on file  Social History Narrative  . Not on file    Review of Systems: See HPI, otherwise negative ROS   Physical Exam: BP (!) 138/98   Pulse 85   Temp 97.6 F (36.4 C) (Oral)   Resp 15   Ht 5\' 2"  (1.575 m)   Wt 222 lb (100.7 kg)   SpO2 99%   BMI 40.60 kg/m   General:   Alert,  pleasant and cooperative in NAD Head:  Normocephalic and atraumatic. Neck:  Supple; Lungs:  Clear throughout to auscultation.    Heart:  Regular rate and rhythm. Abdomen:  Soft, nontender and nondistended. Normal bowel sounds, without guarding, and without rebound.   Neurologic:  Alert and  oriented x4;  grossly normal neurologically.  Impression/Plan:    SCREENING  Plan:  1. TCS TODAY DISCUSSED PROCEDURE, BENEFITS, & RISKS: < 1% chance of medication reaction, bleeding, perforation, or rupture of spleen/liver.

## 2018-03-19 NOTE — Discharge Instructions (Signed)
You had 3 polyps removed. You have SMALL EXTERNAL hemorrhoids.   DRINK WATER TO KEEP YOUR URINE LIGHT YELLOW.  FOLLOW A HIGH FIBER DIET. AVOID ITEMS THAT CAUSE BLOATING & GAS. SEE INFO BELOW.  CONTINUE YOUR WEIGHT LOSS EFFORTS.  WHILE I DO NOT WANT TO ALARM YOU, YOUR BODY MASS INDEX IS OVER 40 WHICH MEANS YOU ARE MORBIDLY BESE. THIS CAN SHORTEN YOUR LIFE EXPECTANCY 10 YEARS. AS WELL [facility]IV[facility] CAN[facility]ER [facility].  [facility] IS ASSOCIATED WITH AN INCREASED RISK FOR CIRRHOSIS AND ALL CANCERS, INCLUDING ESOPHAGEAL AND COLON CANCER. A WEIGHT OF 215 LBS OR LESS    WILL GET YOUR BODY MASS INDEX(BMI) UNDER 40.  A WEIGHT OF 160 LBS OR LESS WILL GET YOUR BODY MASS INDEX(BMI) UNDER 30.  PLEASE LET ME KNOW IF YOU WOULD LIKE A REFERRAL TO THE WEIGHT LOSS SURGERY CLINIC.  YOUR BIOPSY RESULTS WILL BE AVAILABLE IN 7 DAYS.   Next colonoscopy in 3 years.    Colonoscopy Care After Read the instructions outlined below and refer to this sheet in the next week. These discharge instructions provide you with general information on caring for yourself after you leave the hospital. While your treatment has been planned according to the most current medical practices available, unavoidable complications occasionally occur. If you have any problems or questions after discharge, call DR. Aleighna Wojtas, [phone].  ACTIVITY  You may resume your regular activity, but move at a slower pace for the next 24 hours.   Take frequent rest periods for the next 24 hours.   Walking will help get rid of the air and reduce the bloated feeling in your belly (abdomen).   No driving for 24 hours (because of the medicine (anesthesia) used during the test).   You may shower.   Do not sign any important legal documents or operate any machinery for 24 hours (because of the anesthesia used during the test).    NUTRITION  Drink plenty of fluids.   You may resume your normal diet as instructed by your doctor.   Begin with a light  meal and progress to your normal diet. Heavy or fried foods are harder to digest and may make you feel sick to your stomach (nauseated).   Avoid alcoholic beverages for 24 hours or as instructed.    MEDICATIONS  You may resume your normal medications.   WHAT YOU CAN EXPECT TODAY  Some feelings of bloating in the abdomen.   Passage of more gas than usual.   Spotting of blood in your stool or on the toilet paper  .  IF YOU HAD POLYPS REMOVED DURING THE COLONOSCOPY:  Eat a soft diet IF YOU HAVE NAUSEA, BLOATING, ABDOMINAL PAIN, OR VOMITING.    FINDING OUT THE RESULTS OF YOUR TEST Not all test results are available during your visit. DR. Oneida Alar WILL CALL YOU WITHIN 14 DAYS OF YOUR PROCEDUE WITH YOUR RESULTS. Do not assume everything is normal if you have not heard from DR. Keyoni Lapinski, CALL HER OFFICE AT [phone].  SEEK IMMEDIATE MEDICAL ATTENTION AND CALL THE OFFICE: [phone] IF:  You have more than a spotting of blood in your stool.   Your belly is swollen (abdominal distention).   You are nauseated or vomiting.   You have a temperature over 101F.   You have abdominal pain or discomfort that is severe or gets worse throughout the day.   High-Fiber Diet A high-fiber diet changes your normal diet to include more whole grains, legumes, fruits, and vegetables. Changes  in the diet involve replacing refined carbohydrates with unrefined foods. The calorie level of the diet is essentially unchanged. The Dietary Reference Intake (recommended amount) for adult males is 38 grams per day. For adult females, it is 25 grams per day. Pregnant and lactating women should consume 28 grams of fiber per day. Fiber is the intact part of a plant that is not broken down during digestion. Functional fiber is fiber that has been isolated from the plant to provide a beneficial effect in the body. PURPOSE  Increase stool bulk.   Ease and regulate bowel movements.   Lower cholesterol.    REDUCE RISK OF COLON CANCER  INDICATIONS THAT YOU NEED MORE FIBER  Constipation and hemorrhoids.   Uncomplicated diverticulosis (intestine condition) and irritable bowel syndrome.   Weight management.   As a protective measure against hardening of the arteries (atherosclerosis), diabetes, and cancer.   GUIDELINES FOR INCREASING FIBER IN THE DIET  Start adding fiber to the diet slowly. A gradual increase of about 5 more grams (2 slices of whole-wheat bread, 2 servings of most fruits or vegetables, or 1 bowl of high-fiber cereal) per day is best. Too rapid an increase in fiber may result in constipation, flatulence, and bloating.   Drink enough water and fluids to keep your urine clear or pale yellow. Water, juice, or caffeine-free drinks are recommended. Not drinking enough fluid may cause constipation.   Eat a variety of high-fiber foods rather than one type of fiber.   Try to increase your intake of fiber through using high-fiber foods rather than fiber pills or supplements that contain small amounts of fiber.   The goal is to change the types of food eaten. Do not supplement your present diet with high-fiber foods, but replace foods in your present diet.   INCLUDE A VARIETY OF FIBER SOURCES  Replace refined and processed grains with whole grains, canned fruits with fresh fruits, and incorporate other fiber sources. White rice, white breads, and most bakery goods contain little or no fiber.   Brown whole-grain rice, buckwheat oats, and many fruits and vegetables are all good sources of fiber. These include: broccoli, Brussels sprouts, cabbage, cauliflower, beets, sweet potatoes, white potatoes (skin on), carrots, tomatoes, eggplant, squash, berries, fresh fruits, and dried fruits.   Cereals appear to be the richest source of fiber. Cereal fiber is found in whole grains and bran. Bran is the fiber-rich outer coat of cereal grain, which is largely removed in refining. In whole-grain  cereals, the bran remains. In breakfast cereals, the largest amount of fiber is found in those with "bran" in their names. The fiber content is sometimes indicated on the label.   You may need to include additional fruits and vegetables each day.   In baking, for 1 cup white flour, you may use the following substitutions:   1 cup whole-wheat flour minus 2 tablespoons.   1/2 cup white flour plus 1/2 cup whole-wheat flour.   Polyps, Colon  A polyp is extra tissue that grows inside your body. Colon polyps grow in the large intestine. The large intestine, also called the colon, is part of your digestive system. It is a long, hollow tube at the end of your digestive tract where your body makes and stores stool. Most polyps are not dangerous. They are benign. This means they are not cancerous. But over time, some types of polyps can turn into cancer. Polyps that are smaller than a pea are usually not harmful. But larger polyps  could someday become or may already be cancerous. To be safe, doctors remove all polyps and test them.   WHO GETS POLYPS? Anyone can get polyps, but certain people are more likely than others. You may have a greater chance of getting polyps if:  You are over 50.   You have had polyps before.   Someone in your family has had polyps.   Someone in your family has had cancer of the large intestine.   Find out if someone in your family has had polyps. You may also be more likely to get polyps if you:   Eat a lot of fatty foods   Smoke   Drink alcohol   Do not exercise  Eat too much   PREVENTION There is not one sure way to prevent polyps. You might be able to lower your risk of getting them if you:  Eat more fruits and vegetables and less fatty food.   Do not smoke.   Avoid alcohol.   Exercise every day.   Lose weight if you are overweight.   Eating more calcium and folate can also lower your risk of getting polyps. Some foods that are rich in calcium are  milk, cheese, and broccoli. Some foods that are rich in folate are chickpeas, kidney beans, and spinach.   Hemorrhoids Hemorrhoids are dilated (enlarged) veins around the rectum. Sometimes clots will form in the veins. This makes them swollen and painful. These are called thrombosed hemorrhoids. Causes of hemorrhoids include:  Constipation.   Straining to have a bowel movement.   HEAVY LIFTING  HOME CARE INSTRUCTIONS  Eat a well balanced diet and drink 6 to 8 glasses of water every day to avoid constipation. You may also use a bulk laxative.   Avoid straining to have bowel movements.   Keep anal area dry and clean.   Do not use a donut shaped pillow or sit on the toilet for long periods. This increases blood pooling and pain.   Move your bowels when your body has the urge; this will require less straining and will decrease pain and pressure.

## 2018-03-24 ENCOUNTER — Encounter (HOSPITAL_COMMUNITY): Payer: Self-pay | Admitting: Gastroenterology

## 2018-03-26 ENCOUNTER — Telehealth: Payer: Self-pay | Admitting: Internal Medicine

## 2018-03-26 NOTE — Telephone Encounter (Signed)
ERROR WRONG PATIENT/ DISREGARD

## 2018-03-26 NOTE — Telephone Encounter (Signed)
Pt was calling to see if her CT results were available, Please call [phone]

## 2018-03-30 ENCOUNTER — Telehealth: Payer: Self-pay | Admitting: Family Medicine

## 2018-03-30 ENCOUNTER — Other Ambulatory Visit: Payer: Self-pay

## 2018-03-30 DIAGNOSIS — I10 Essential (primary) hypertension: Secondary | ICD-10-CM

## 2018-03-30 MED ORDER — MONTELUKAST SODIUM 10 MG PO TABS: ORAL_TABLET | ORAL | 5 refills | Status: DC

## 2018-03-30 MED ORDER — ASPIRIN 81 MG PO TBEC: 81.0000 mg | DELAYED_RELEASE_TABLET | Freq: Every day | ORAL | 5 refills | Status: DC

## 2018-03-30 MED ORDER — TRIAMTERENE-HCTZ 75-50 MG PO TABS: 1.0000 | ORAL_TABLET | Freq: Every day | ORAL | 5 refills | Status: DC

## 2018-03-30 MED ORDER — ATORVASTATIN CALCIUM 40 MG PO TABS: ORAL_TABLET | ORAL | 5 refills | Status: DC

## 2018-03-30 MED ORDER — RANITIDINE HCL 300 MG PO TABS: 300.0000 mg | ORAL_TABLET | Freq: Every day | ORAL | 5 refills | Status: DC

## 2018-03-30 NOTE — Telephone Encounter (Signed)
Done

## 2018-03-30 NOTE — Telephone Encounter (Signed)
Aspirin 81 mg  Lipitor 40 Coreg 6.25 Lasix 40 mg Singulair 10 mg Zantac 300 mg  Maxzide 75-50 mg   [employer] --

## 2018-03-31 ENCOUNTER — Telehealth: Payer: Self-pay | Admitting: Gastroenterology

## 2018-03-31 NOTE — Telephone Encounter (Signed)
TWO SIMPLE ADENOMAS AND ONE LYMPHOID POLYP REMOVED.     DRINK WATER TO KEEP YOUR URINE LIGHT YELLOW.  FOLLOW A HIGH FIBER DIET. AVOID ITEMS THAT CAUSE BLOATING & GAS.   CONTINUE YOUR WEIGHT LOSS EFFORTS.  A WEIGHT OF 215 LBS OR LESS    WILL GET YOUR BODY MASS INDEX(BMI) UNDER 40.  A WEIGHT OF 160 LBS OR LESS WILL GET YOUR BODY MASS INDEX(BMI) UNDER 30.    PLEASE LET ME KNOW IF YOU WOULD LIKE A REFERRAL TO THE WEIGHT LOSS SURGERY CLINIC.  Next colonoscopy in IN 5-10 YEARS NOT 3 years.

## 2018-04-01 NOTE — Telephone Encounter (Signed)
ON RECALL  °

## 2018-04-01 NOTE — Telephone Encounter (Signed)
LMOM to call.

## 2018-04-05 NOTE — Telephone Encounter (Signed)
Pt notified of results and will contact Dr. Oneida Alar if she wants to pursue getting a weight loss referral.

## 2018-06-22 ENCOUNTER — Encounter: Payer: Self-pay | Admitting: Family Medicine

## 2018-06-22 ENCOUNTER — Ambulatory Visit (INDEPENDENT_AMBULATORY_CARE_PROVIDER_SITE_OTHER): Payer: BLUE CROSS/BLUE SHIELD | Admitting: Family Medicine

## 2018-06-22 VITALS — BP 178/110 | HR 94 | Resp 16 | Ht 62.0 in | Wt 228.0 lb

## 2018-06-22 DIAGNOSIS — R4689 Other symptoms and signs involving appearance and behavior: Secondary | ICD-10-CM

## 2018-06-22 DIAGNOSIS — I5042 Chronic combined systolic (congestive) and diastolic (congestive) heart failure: Secondary | ICD-10-CM

## 2018-06-22 DIAGNOSIS — I10 Essential (primary) hypertension: Secondary | ICD-10-CM

## 2018-06-22 DIAGNOSIS — E785 Hyperlipidemia, unspecified: Secondary | ICD-10-CM | POA: Diagnosis not present

## 2018-06-22 DIAGNOSIS — K089 Disorder of teeth and supporting structures, unspecified: Secondary | ICD-10-CM

## 2018-06-22 MED ORDER — TRIAMTERENE-HCTZ 75-50 MG PO TABS: 1.0000 | ORAL_TABLET | Freq: Every day | ORAL | 11 refills | Status: DC

## 2018-06-22 MED ORDER — ATORVASTATIN CALCIUM 40 MG PO TABS: 40.0000 mg | ORAL_TABLET | Freq: Every day | ORAL | 11 refills | Status: DC

## 2018-06-22 MED ORDER — CARVEDILOL 6.25 MG PO TABS: 6.2500 mg | ORAL_TABLET | Freq: Two times a day (BID) | ORAL | 3 refills | Status: DC

## 2018-06-22 MED ORDER — SACUBITRIL-VALSARTAN 49-51 MG PO TABS: 1.0000 | ORAL_TABLET | Freq: Two times a day (BID) | ORAL | 3 refills | Status: DC

## 2018-06-22 NOTE — Assessment & Plan Note (Signed)
Decompensation based on h/o increased cough , dry, and exertional fatigue, the fact that pt is non compliant with her antihypertensive and heart failure medication is he underlying issue. I spend 5 minutes explaining how serious the consequences of her non compliance are , she is to start medication  regularly

## 2018-06-22 NOTE — Assessment & Plan Note (Signed)
Deteriorated. Patient re-educated about  the importance of commitment to a  minimum of 150 minutes of exercise per week.  The importance of healthy food choices with portion control discussed. Encouraged to start a food diary, count calories and to consider  joining a support group. Sample diet sheets offered. Goals set by the patient for the next several months.   Weight /BMI 06/22/2018 03/19/2018 01/18/2018  WEIGHT 228 lb 222 lb 222 lb  HEIGHT 5\' 2"  5\' 2"  5\' 2"   BMI 41.7 kg/m2 40.6 kg/m2 40.6 kg/m2

## 2018-06-22 NOTE — Assessment & Plan Note (Signed)
Pharmacy hanged per pt request to [place]

## 2018-06-22 NOTE — Patient Instructions (Addendum)
Physical exam in 6 weeks, call if you need me before ( afternoon appt per pt)    PLEASE start blood pressure and heart medication  TODAY, your new pharamcy as requested is [facility]    Fasting lipid cmp and eGFR, CBC and TSH 1 week befopre next appt  You are being provided with information to obtain assistance with medication through the health dept since you are noyt able to afford medications which you really do need on a daily basis  to be healthy

## 2018-06-22 NOTE — Assessment & Plan Note (Signed)
Uncontrolled, not taking medication regularly, states financial challenge is the reason, changed supplier and have provided resource info for med assistanc DASH diet and commitment to daily physical activity for a minimum of 30 minutes discussed and encouraged, as a part of hypertension management. The importance of attaining a healthy weight is also discussed.  BP/Weight 06/22/2018 03/19/2018 01/18/2018 10/22/2017 08/20/2017 07/21/2017 11/21/5425  Systolic BP 062 376 283 151 761 607 371  Diastolic BP 062 54 84 92 76 78 80  Wt. (Lbs) 228 222 222 222 218.5 215 210  BMI 41.7 40.6 40.6 40.6 39.96 39.32 38.41   F/u in 6 weeks

## 2018-06-22 NOTE — Progress Notes (Signed)
Michelle David     MRN: [id number]      DOB: 09-Nov-1960   HPI Michelle David is here for follow up and re-evaluation of chronic medical conditions, medication management and review of any available recent lab and radiology data.  Preventive health is updated, specifically  Cancer screening and Immunization.   She has not taken most of her medications, which she ABSOLUTELY NEEDS since January and others since June. Finnacial stress is the problem she reports. C/o increased fatigue and poor exercise tolerance in the past several weeks Info for assistance through health dept is provided and she has changed her scripts to [place] Denies recent fever or chills. Denies sinus pressure, nasal congestion, ear pain or sore throat. Denies chest congestion, productive cough or wheezing. Denies chest pains, palpitations and leg swelling, notes increased shortness of breath and cough Denies abdominal pain, nausea, vomiting,diarrhea or constipation.   Denies dysuria, frequency, hesitancy or incontinence. Denies joint pain, swelling and limitation in mobility. Denies headaches, seizures, numbness, or tingling. Denies depression, anxiety or insomnia. Denies skin break down or rash.   PE  BP (!) 178/110   Pulse 94   Resp 16   Ht 5\' 2"  (1.575 m)   Wt 228 lb (103.4 kg)   BMI 41.70 kg/m   Patient alert and oriented and in  cardiopulmonary distress.  HEENT: No facial asymmetry, EOMI,   oropharynx pink and moist.  Neck supple no JVD, no mass.  Chest: Clear to auscultation bilaterally.  CVS: S1, S2 systolic  murmur, no S3.Regular rate.  ABD: Soft non tender.   Ext: No edema  MS: Adequate ROM spine, shoulders, hips and knees.  Skin: Intact, no ulcerations or rash noted.  Psych: Good eye contact, normal affect. Memory intact not anxious or depressed appearing.  CNS: CN 2-12 intact, power,  normal throughout.no focal deficits noted.   Assessment & Plan  HTN (hypertension),  malignant Uncontrolled, not taking medication regularly, states financial challenge is the reason, changed supplier and have provided resource info for med assistanc DASH diet and commitment to daily physical activity for a minimum of 30 minutes discussed and encouraged, as a part of hypertension management. The importance of attaining a healthy weight is also discussed.  BP/Weight 06/22/2018 03/19/2018 01/18/2018 10/22/2017 08/20/2017 07/21/2017 4/65/6812  Systolic BP 751 700 174 944 967 591 638  Diastolic BP 466 54 84 92 76 78 80  Wt. (Lbs) 228 222 222 222 218.5 215 210  BMI 41.7 40.6 40.6 40.6 39.96 39.32 38.41   F/u in 6 weeks    Morbid obesity Deteriorated. Patient re-educated about  the importance of commitment to a  minimum of 150 minutes of exercise per week.  The importance of healthy food choices with portion control discussed. Encouraged to start a food diary, count calories and to consider  joining a support group. Sample diet sheets offered. Goals set by the patient for the next several months.   Weight /BMI 06/22/2018 03/19/2018 01/18/2018  WEIGHT 228 lb 222 lb 222 lb  HEIGHT 5\' 2"  5\' 2"  5\' 2"   BMI 41.7 kg/m2 40.6 kg/m2 40.6 kg/m2      Poor dentition Unchanged  Non-compliant behavior Pharmacy hanged per pt request to [facility]  Chronic combined systolic and diastolic CHF (congestive heart failure) ([place]) Decompensation based on h/o increased cough , dry, and exertional fatigue, the fact that pt is non compliant with her antihypertensive and heart failure medication is he underlying issue. I spend 5  minutes explaining how serious the consequences of her non compliance are , she is to start medication  regularly

## 2018-06-22 NOTE — Assessment & Plan Note (Signed)
Unchanged

## 2018-08-02 ENCOUNTER — Ambulatory Visit (HOSPITAL_COMMUNITY)
Admission: RE | Admit: 2018-08-02 | Discharge: 2018-08-02 | Disposition: A | Discharge status: Home / Self Care | Payer: BLUE CROSS/BLUE SHIELD | Source: Ambulatory Visit | Attending: Family Medicine | Admitting: Family Medicine

## 2018-08-02 ENCOUNTER — Ambulatory Visit (INDEPENDENT_AMBULATORY_CARE_PROVIDER_SITE_OTHER): Payer: BLUE CROSS/BLUE SHIELD | Admitting: Family Medicine

## 2018-08-02 ENCOUNTER — Encounter: Payer: Self-pay | Admitting: Family Medicine

## 2018-08-02 VITALS — BP 178/110 | HR 110 | Resp 12 | Ht 62.0 in | Wt 228.0 lb

## 2018-08-02 DIAGNOSIS — R059 Cough, unspecified: Secondary | ICD-10-CM

## 2018-08-02 DIAGNOSIS — Z23 Encounter for immunization: Secondary | ICD-10-CM

## 2018-08-02 DIAGNOSIS — Z0000 Encounter for general adult medical examination without abnormal findings: Secondary | ICD-10-CM | POA: Diagnosis not present

## 2018-08-02 DIAGNOSIS — I509 Heart failure, unspecified: Secondary | ICD-10-CM | POA: Diagnosis present

## 2018-08-02 DIAGNOSIS — R05 Cough: Secondary | ICD-10-CM

## 2018-08-02 DIAGNOSIS — R7303 Prediabetes: Secondary | ICD-10-CM

## 2018-08-02 DIAGNOSIS — I5042 Chronic combined systolic (congestive) and diastolic (congestive) heart failure: Secondary | ICD-10-CM

## 2018-08-02 DIAGNOSIS — E8881 Metabolic syndrome: Secondary | ICD-10-CM

## 2018-08-02 DIAGNOSIS — I10 Essential (primary) hypertension: Secondary | ICD-10-CM

## 2018-08-02 DIAGNOSIS — E785 Hyperlipidemia, unspecified: Secondary | ICD-10-CM

## 2018-08-02 NOTE — Assessment & Plan Note (Signed)
1 month h/o increased cough and chest congestion, needs cardiology evaluation past due

## 2018-08-02 NOTE — Patient Instructions (Addendum)
F/U in 3 month, call if you need me before  CXR today''Flu vaccine today  Labs today next door, lipid, cmp and EGFr, BNP , CBC, TSh and HBA1C today  You are referred to cardiology pls if you do not hear about the date from us call/ us or the office by Thursday or pass in 

## 2018-08-03 LAB — COMPLETE METABOLIC PANEL WITH GFR
AG RATIO: 0.7 (calc) — AB (ref 1.0–2.5)
ALT: 38 U/L — ABNORMAL HIGH (ref 6–29)
AST: 34 U/L (ref 10–35)
Albumin: 3.4 g/dL — ABNORMAL LOW (ref 3.6–5.1)
Alkaline phosphatase (APISO): 83 U/L (ref 33–130)
BILIRUBIN TOTAL: 0.3 mg/dL (ref 0.2–1.2)
BUN: 16 mg/dL (ref 7–25)
CALCIUM: 8.7 mg/dL (ref 8.6–10.4)
CHLORIDE: 101 mmol/L (ref 98–110)
CO2: 28 mmol/L (ref 20–32)
Creat: 0.89 mg/dL (ref 0.50–1.05)
GFR, EST AFRICAN AMERICAN: 85 mL/min/{1.73_m2} (ref >=60)
GFR, EST NON AFRICAN AMERICAN: 73 mL/min/{1.73_m2} (ref >=60)
GLOBULIN: 5 g/dL — AB (ref 1.9–3.7)
Glucose, Bld: 93 mg/dL (ref 65–99)
POTASSIUM: 4.3 mmol/L (ref 3.5–5.3)
SODIUM: 136 mmol/L (ref 135–146)
TOTAL PROTEIN: 8.4 g/dL — AB (ref 6.1–8.1)

## 2018-08-03 LAB — CBC
HEMATOCRIT: 36.6 % (ref 35.0–45.0)
HEMOGLOBIN: 11.8 g/dL (ref 11.7–15.5)
MCH: 27.7 pg (ref 27.0–33.0)
MCHC: 32.2 g/dL (ref 32.0–36.0)
MCV: 85.9 fL (ref 80.0–100.0)
MPV: 8.8 fL (ref 7.5–12.5)
Platelets: 487 10*3/uL — ABNORMAL HIGH (ref 140–400)
RBC: 4.26 10*6/uL (ref 3.80–5.10)
RDW: 14.3 % (ref 11.0–15.0)
WBC: 5.4 10*3/uL (ref 3.8–10.8)

## 2018-08-03 LAB — HEMOGLOBIN A1C
EAG (MMOL/L): 5.8 (calc)
Hgb A1c MFr Bld: 5.3 % of total Hgb (ref <5.7)
Mean Plasma Glucose: 105 (calc)

## 2018-08-03 LAB — LIPID PANEL
CHOL/HDL RATIO: 3.7 (calc) (ref <5.0)
CHOLESTEROL: 165 mg/dL (ref <200)
HDL: 45 mg/dL — AB (ref >50)
LDL Cholesterol (Calc): 103 mg/dL (calc) — ABNORMAL HIGH
Non-HDL Cholesterol (Calc): 120 mg/dL (calc) (ref <130)
TRIGLYCERIDES: 84 mg/dL (ref <150)

## 2018-08-03 LAB — BRAIN NATRIURETIC PEPTIDE: Brain Natriuretic Peptide: 74 pg/mL (ref <100)

## 2018-08-03 LAB — TSH: TSH: 2.72 mIU/L

## 2018-08-11 ENCOUNTER — Encounter: Payer: Self-pay | Admitting: Cardiology

## 2018-08-11 NOTE — Progress Notes (Signed)
Cardiology Office Note  Date: 08/12/2018   ID: Michelle David, Michelle David Nov 19, 1960, MRN [id number]  PCP: Fayrene Helper, MD  Primary Cardiologist: Rozann Lesches, MD   Chief Complaint  Patient presents with  . Cardiomyopathy    History of Present Illness: Michelle David is a 57 y.o. female seen in October 2018.  She presents for routine follow-up visit.  She parked a long distance from the office today and had to walk more than usual, short of breath and tachycardic today.  She does state however that at baseline she has had increasing dyspnea on exertion.  She stopped Entresto at some point since I last saw her, cannot recall why.  Her weight is stable.  Blood pressure has not been well controlled.  Last echocardiogram in October 2018 revealed LVEF 35 to 40% range with mild diastolic dysfunction.  We went over her current medications.  Plan is to increase Coreg to 12.5 mg twice daily and reinitiate Entresto at 24/26 mg twice daily.  Continue Lasix.  Depending on follow-up BMET I will likely take her off Maxide and initiate Aldactone.  We will need to see her back for reassessment along the way.  Past Medical History:  Diagnosis Date  . Cardiomyopathy [facility])    Diagnosed March 2018  . Dyspepsia   . Essential hypertension   . Hyperlipidemia   . Impaired glucose tolerance   . Obesity     Past Surgical History:  Procedure Laterality Date  . COLONOSCOPY N/A 03/19/2018   Procedure: COLONOSCOPY;  Surgeon: Danie Binder, MD;  Location: [facility];  Service: Endoscopy;  Laterality: N/A;  2:00  . OOPHORECTOMY    . TUBAL LIGATION      Current Outpatient Medications  Medication Sig Dispense Refill  . aspirin (ASPIRIN LOW DOSE) 81 MG EC tablet Take 1 tablet (81 mg total) by mouth daily. Swallow whole. 30 tablet 5  . atorvastatin (LIPITOR) 40 MG tablet Take 1 tablet (40 mg total) by mouth daily. 30 tablet 11  . carvedilol (COREG) 12.5 MG tablet Take 1 tablet (12.5 mg  total) by mouth 2 (two) times daily. 60 tablet 6  . furosemide (LASIX) 40 MG tablet Take 1 tablet (40 mg total) by mouth daily. 90 tablet 3  . ranitidine (ZANTAC) 300 MG tablet Take 1 tablet (300 mg total) by mouth at bedtime. 30 tablet 5  . triamterene-hydrochlorothiazide (MAXZIDE) 75-50 MG tablet Take 1 tablet by mouth daily. 30 tablet 11  . sacubitril-valsartan (ENTRESTO) 24-26 MG Take 1 tablet by mouth 2 (two) times daily. 60 tablet 6   No current facility-administered medications for this visit.    Allergies:  Patient has no known allergies.   Social History: The patient  reports that she has never smoked. She has never used smokeless tobacco. She reports that she drinks alcohol. She reports that she does not use drugs.   ROS:  Please see the history of present illness. Otherwise, complete review of systems is positive for dyspnea exertion.  All other systems are reviewed and negative.   Physical Exam: VS:  BP (!) 140/94   Pulse (!) 116   Ht 5\' 2"  (1.575 m)   Wt 228 lb (103.4 kg)   SpO2 97%   BMI 41.70 kg/m , BMI Body mass index is 41.7 kg/m.  Wt Readings from Last 3 Encounters:  08/12/18 228 lb (103.4 kg)  08/02/18 228 lb 0.6 oz (103.4 kg)  06/22/18 228 lb (103.4 kg)  General: Overweight woman, no distress. HEENT: Conjunctiva and lids normal, oropharynx clear. Neck: Supple, no elevated JVP or carotid bruits, no thyromegaly. Lungs: No wheezing, nonlabored breathing at rest. Cardiac: Indistinct PMI, RRR, no S3 or significant systolic murmur. Abdomen: Soft, nontender, bowel sounds present. Extremities: No pitting edema, distal pulses 2+. Skin: Warm and dry. Musculoskeletal: No kyphosis. Neuropsychiatric: Alert and oriented x3, affect grossly appropriate.  ECG: I personally reviewed the tracing from 03/26/2017 which showed sinus tachycardia with PAC and increased voltage.  Recent Labwork: 08/02/2018: ALT 38; AST 34; Brain Natriuretic Peptide 74; BUN 16; Creat 0.89;  Hemoglobin 11.8; Platelets 487; Potassium 4.3; Sodium 136; TSH 2.72     Component Value Date/Time   CHOL 165 08/02/2018 1631   TRIG 84 08/02/2018 1631   HDL 45 (L) 08/02/2018 1631   CHOLHDL 3.7 08/02/2018 1631   VLDL 26 01/15/2017 1816   LDLCALC 103 (H) 08/02/2018 1631    Other Studies Reviewed Today:  Echocardiogram 07/24/2017: Study Conclusions  - Left ventricle: The cavity size was normal. There was mild   concentric hypertrophy. Systolic function was moderately reduced.   The estimated ejection fraction was in the range of 35% to 40%.   Diffuse hypokinesis. Doppler parameters are consistent with   abnormal left ventricular relaxation (grade 1 diastolic   dysfunction). Doppler parameters are consistent with high   ventricular filling pressure. - Aortic valve: Transvalvular velocity was within the normal range.   There was no stenosis. There was mild regurgitation. - Mitral valve: Transvalvular velocity was within the normal range.   There was no evidence for stenosis. There was mild regurgitation. - Right ventricle: The cavity size was normal. Wall thickness was   normal. Systolic function was normal.  Assessment and Plan:  1.  History of presumed nonischemic cardiomyopathy based on previous work-up, LVEF was 35 to 40% as of October 2018.  She reports worsening dyspnea on exertion, stopped Entresto since our last encounter.  Her weight is stable and she has continued on Lasix along with Coreg.  Plan at this time is to increase Coreg to 12.5 mg twice daily, reinitiate Entresto starting at 24/26 mg twice daily, and follow-up echocardiogram for reassessment.  She will need a BMET in 2 weeks and clinical follow-up thereafter for continued medication adjustments.  Would aim towards switching from [facility] to Aldactone depending on follow-up lab work.  2.  Essential hypertension, blood pressure not well controlled.  Medication adjustments are being made.  3.  Mixed hyperlipidemia, on  Lipitor with follow-up per Dr. Moshe Cipro.  Recent LDL 103.  Current medicines were reviewed with the patient today.   Orders Placed This Encounter  Procedures  . Basic Metabolic Panel (BMET)  . ECHOCARDIOGRAM COMPLETE    Disposition: Follow-up in the next 4 weeks.  Signed, Satira Sark, MD, [facility] 08/12/2018 4:54 PM    [facility] at [facility] [address] [place], [place] [address] Phone: [phone]; Fax: [phone]

## 2018-08-12 ENCOUNTER — Ambulatory Visit: Payer: BLUE CROSS/BLUE SHIELD | Admitting: Cardiology

## 2018-08-12 ENCOUNTER — Encounter: Payer: Self-pay | Admitting: Cardiology

## 2018-08-12 VITALS — BP 140/94 | HR 116 | Ht 62.0 in | Wt 228.0 lb

## 2018-08-12 DIAGNOSIS — I10 Essential (primary) hypertension: Secondary | ICD-10-CM

## 2018-08-12 DIAGNOSIS — I428 Other cardiomyopathies: Secondary | ICD-10-CM | POA: Diagnosis not present

## 2018-08-12 DIAGNOSIS — E782 Mixed hyperlipidemia: Secondary | ICD-10-CM | POA: Diagnosis not present

## 2018-08-12 MED ORDER — SACUBITRIL-VALSARTAN 24-26 MG PO TABS: 1.0000 | ORAL_TABLET | Freq: Two times a day (BID) | ORAL | 6 refills | Status: DC

## 2018-08-12 MED ORDER — CARVEDILOL 12.5 MG PO TABS: 12.5000 mg | ORAL_TABLET | Freq: Two times a day (BID) | ORAL | 6 refills | Status: DC

## 2018-08-12 NOTE — Patient Instructions (Signed)
Medication Instructions:  INCREASE Coreg to 12.5 mg twice a day  START Entresto 24/26 mg 1 tablet twice a day  If you need a refill on your cardiac medications before your next appointment, please call your pharmacy.   Lab work: BMET in 2 weeks If you have labs (blood work) drawn today and your tests are completely normal, you will receive your results only by: Marland Kitchen MyChart Message (if you have MyChart) OR . A paper copy in the mail If you have any lab test that is abnormal or we need to change your treatment, we will call you to review the results.  Testing/Procedures: Your physician has requested that you have an echocardiogram. Echocardiography is a painless test that uses sound waves to create images of your heart. It provides your doctor with information about the size and shape of your heart and how well your heart's chambers and valves are working. This procedure takes approximately one hour. There are no restrictions for this procedure.    Follow-Up:In 4-6 weeks with Dr.McDowell      Thank you for choosing [facility] !

## 2018-08-15 ENCOUNTER — Encounter: Payer: Self-pay | Admitting: Family Medicine

## 2018-08-15 DIAGNOSIS — Z0000 Encounter for general adult medical examination without abnormal findings: Secondary | ICD-10-CM | POA: Insufficient documentation

## 2018-08-15 NOTE — Progress Notes (Signed)
Michelle David     MRN: [id number]      DOB: 1961-10-17  HPI: Patient is in for annual physical exam. Immunization is reviewed , and  Updated 1 month h/o cough and fatigue, non productive, denies sputum , fever or chills. Has discontinued her entresto and is out of some of the medication which she does take  PE: BP (!) 178/110 (BP Location: Left Arm, Patient Position: Sitting, Cuff Size: Large)   Pulse (!) 110   Resp 12   Ht 5\' 2"  (1.575 m)   Wt 228 lb 0.6 oz (103.4 kg)   SpO2 97% Comment: room air  BMI 41.71 kg/m   Pleasant  female, alert and oriented x 3, in mild  cardio-pulmonary distress. Afebrile. HEENT No facial trauma or asymetry. Sinuses non tender.  Extra occullar muscles intact, External ears normal, tympanic membranes clear. Oropharynx moist, no exudate.Very poor dentition  Neck: supple, no adenopathy,JVD or thyromegaly.No bruits.  Chest: Clear to ascultation bilaterally.No crackles or wheezes. Non tender to palpation  Breast: No asymetry,no masses or lumps. No tenderness. No nipple discharge or inversion. No axillary or supraclavicular adenopathy  Cardiovascular system; Heart sounds normal,  S1 and  S2 ,no S3.  No murmur, or thrill. Apical beat not displaced Peripheral pulses normal.  Abdomen: Soft, non tender, no organomegaly or masses. No bruits. Bowel sounds normal. No guarding, tenderness or rebound.     Musculoskeletal exam: Full ROM of spine, hips , shoulders and knees. No deformity ,swelling or crepitus noted. No muscle wasting or atrophy.   Neurologic: Cranial nerves 2 to 12 intact. Power, tone ,sensation and reflexes normal throughout. No disturbance in gait. No tremor.  Skin: Intact, no ulceration, erythema , scaling or rash noted. Pigmentation normal throughout  Psych; Normal mood and affect. Judgement and concentration normal   Assessment & Plan:  Chronic combined systolic and diastolic CHF (congestive heart failure)  (HCC) 1 month h/o increased cough and chest congestion, needs cardiology evaluation past due   Cough Pt with known heart failure who is non compliant with medication c/o excess cough and fatigue. CXR, today, resume antihypertensive medication and cardiology evaluation is past due   HTN (hypertension), malignant Uncontrolled due to non compliance with treatment plan Re educated re danger of non compliance and she is to resume all meds as prescribe DASH diet and commitment to daily physical activity for a minimum of 30 minutes discussed and encouraged, as a part of hypertension management. The importance of attaining a healthy weight is also discussed.  BP/Weight 08/12/2018 08/02/2018 06/22/2018 03/19/2018 01/18/2018 10/22/2017 53/03/1442  Systolic BP 154 008 676 195 093 267 124  Diastolic BP 94 580 998 54 84 92 76  Wt. (Lbs) 228 228.04 228 222 222 222 218.5  BMI 41.7 41.71 41.7 40.6 40.6 40.6 39.96       Morbid obesity Unchanged Patient re-educated about  the importance of commitment to a  minimum of 150 minutes of exercise per week.  The importance of healthy food choices with portion control discussed. Encouraged to start a food diary, count calories and to consider  joining a support group. Sample diet sheets offered. Goals set by the patient for the next several months.   Weight /BMI 08/12/2018 08/02/2018 06/22/2018  WEIGHT 228 lb 228 lb 0.6 oz 228 lb  HEIGHT 5\' 2"  5\' 2"  5\' 2"   BMI 41.7 kg/m2 41.71 kg/m2 41.7 kg/m2      Hyperlipidemia LDL goal <100 Hyperlipidemia:Low fat diet discussed and encouraged.  Lipid Panel  Lab Results  Component Value Date   CHOL 165 08/02/2018   HDL 45 (L) 08/02/2018   LDLCALC 103 (H) 08/02/2018   TRIG 84 08/02/2018   CHOLHDL 3.7 08/02/2018  needs to reduce fat intake and commit to regular exercise

## 2018-08-15 NOTE — Assessment & Plan Note (Signed)
Hyperlipidemia:Low fat diet discussed and encouraged.   Lipid Panel  Lab Results  Component Value Date   CHOL 165 08/02/2018   HDL 45 (L) 08/02/2018   LDLCALC 103 (H) 08/02/2018   TRIG 84 08/02/2018   CHOLHDL 3.7 08/02/2018  needs to reduce fat intake and commit to regular exercise

## 2018-08-15 NOTE — Assessment & Plan Note (Signed)
Pt with known heart failure who is non compliant with medication c/o excess cough and fatigue. CXR, today, resume antihypertensive medication and cardiology evaluation is past due

## 2018-08-15 NOTE — Assessment & Plan Note (Signed)
Uncontrolled due to non compliance with treatment plan Re educated re danger of non compliance and she is to resume all meds as prescribe DASH diet and commitment to daily physical activity for a minimum of 30 minutes discussed and encouraged, as a part of hypertension management. The importance of attaining a healthy weight is also discussed.  BP/Weight 08/12/2018 08/02/2018 06/22/2018 03/19/2018 01/18/2018 10/22/2017 03/22/6947  Systolic BP 546 270 350 093 818 299 371  Diastolic BP 94 696 789 54 84 92 76  Wt. (Lbs) 228 228.04 228 222 222 222 218.5  BMI 41.7 41.71 41.7 40.6 40.6 40.6 39.96

## 2018-08-15 NOTE — Assessment & Plan Note (Signed)
Unchanged Patient re-educated about  the importance of commitment to a  minimum of 150 minutes of exercise per week.  The importance of healthy food choices with portion control discussed. Encouraged to start a food diary, count calories and to consider  joining a support group. Sample diet sheets offered. Goals set by the patient for the next several months.   Weight /BMI 08/12/2018 08/02/2018 06/22/2018  WEIGHT 228 lb 228 lb 0.6 oz 228 lb  HEIGHT 5\' 2"  5\' 2"  5\' 2"   BMI 41.7 kg/m2 41.71 kg/m2 41.7 kg/m2

## 2018-08-18 ENCOUNTER — Ambulatory Visit (HOSPITAL_COMMUNITY)
Admission: RE | Admit: 2018-08-18 | Discharge: 2018-08-18 | Disposition: A | Discharge status: Home / Self Care | Payer: BLUE CROSS/BLUE SHIELD | Source: Ambulatory Visit | Attending: Cardiology | Admitting: Cardiology

## 2018-08-18 ENCOUNTER — Other Ambulatory Visit (HOSPITAL_COMMUNITY): Payer: Self-pay | Admitting: *Deleted

## 2018-08-18 DIAGNOSIS — I110 Hypertensive heart disease with heart failure: Secondary | ICD-10-CM | POA: Insufficient documentation

## 2018-08-18 DIAGNOSIS — I428 Other cardiomyopathies: Secondary | ICD-10-CM

## 2018-08-18 DIAGNOSIS — E785 Hyperlipidemia, unspecified: Secondary | ICD-10-CM | POA: Insufficient documentation

## 2018-08-18 DIAGNOSIS — I340 Nonrheumatic mitral (valve) insufficiency: Secondary | ICD-10-CM | POA: Insufficient documentation

## 2018-08-18 DIAGNOSIS — Z6841 Body Mass Index (BMI) 40.0 and over, adult: Secondary | ICD-10-CM | POA: Diagnosis not present

## 2018-08-18 DIAGNOSIS — I5042 Chronic combined systolic (congestive) and diastolic (congestive) heart failure: Secondary | ICD-10-CM | POA: Insufficient documentation

## 2018-08-18 DIAGNOSIS — I429 Cardiomyopathy, unspecified: Secondary | ICD-10-CM | POA: Diagnosis present

## 2018-08-18 MED ORDER — PERFLUTREN LIPID MICROSPHERE
1.0000 mL | INTRAVENOUS | Status: AC | PRN
  Administered 2018-08-18: 1 mL via INTRAVENOUS
  Administered 2018-08-18: 2 mL via INTRAVENOUS
  Filled 2018-08-18: qty 10

## 2018-08-18 NOTE — Progress Notes (Signed)
*  PRELIMINARY RESULTS* Echocardiogram 2D Echocardiogram has been performed with Definity.  Samuel Germany 08/18/2018, 4:09 PM

## 2018-09-09 ENCOUNTER — Ambulatory Visit: Payer: BLUE CROSS/BLUE SHIELD | Admitting: Cardiology

## 2018-09-09 ENCOUNTER — Encounter: Payer: Self-pay | Admitting: Cardiology

## 2018-09-09 VITALS — BP 126/82 | HR 102 | Wt 223.0 lb

## 2018-09-09 DIAGNOSIS — I428 Other cardiomyopathies: Secondary | ICD-10-CM

## 2018-09-09 DIAGNOSIS — E782 Mixed hyperlipidemia: Secondary | ICD-10-CM

## 2018-09-09 DIAGNOSIS — I10 Essential (primary) hypertension: Secondary | ICD-10-CM | POA: Diagnosis not present

## 2018-09-09 MED ORDER — FUROSEMIDE 40 MG PO TABS: ORAL_TABLET | ORAL | 3 refills | Status: DC

## 2018-09-09 MED ORDER — CARVEDILOL 25 MG PO TABS: 25.0000 mg | ORAL_TABLET | Freq: Two times a day (BID) | ORAL | 6 refills | Status: DC

## 2018-09-09 MED ORDER — LOSARTAN POTASSIUM 25 MG PO TABS: 12.5000 mg | ORAL_TABLET | Freq: Every day | ORAL | 3 refills | Status: DC

## 2018-09-09 NOTE — Patient Instructions (Signed)
Medication Instructions:  STOP Aspirin  STOP Entresto  STOP Maxzide     START Losartan 12.5 mg daily  INCREASE Coreg to 25 mg twice a day    Take Lasix 40 mg ONLY if you have weight gain over 2-3 lbs in 24 hours    If you need a refill on your cardiac medications before your next appointment, please call your pharmacy.   Lab work: Probation officer in 1 week  ( 09/15/18)  If you have labs (blood work) drawn today and your tests are completely normal, you will receive your results only by: Marland Kitchen MyChart Message (if you have MyChart) OR . A paper copy in the mail If you have any lab test that is abnormal or we need to change your treatment, we will call you to review the results.  Testing/Procedures: NONE    FOLLOW UP IN 6- 8 WEEKS

## 2018-09-09 NOTE — Progress Notes (Signed)
Cardiology Office Note  Date: 09/09/2018   ID: Michelle, David Mar 03, 1961, MRN [id number]  PCP: Fayrene Helper, MD  Primary Cardiologist: Rozann Lesches, MD   Chief Complaint  Patient presents with  . Cardiomyopathy    History of Present Illness: Michelle David is a 57 y.o. female seen recently in October.  We made medication adjustments at that time.  Since then however she indicates that [name] has been too expensive for her to get with her current insurance coverage.  She also ran out of Lasix, not taking Maxide either.  Complains of cough, no chest pain or syncope.  Recent follow-up echocardiogram in October revealed moderate LVH with LVEF 45 to 93%, mild diastolic dysfunction.  This actually shows some improvement compared to prior testing.  I reviewed the results with her today.  Today we discussed modifications in medical therapy to all generics and will continue titration depending on how she does.  Past Medical History:  Diagnosis Date  . Cardiomyopathy [facility])    Diagnosed March 2018  . Dyspepsia   . Essential hypertension   . Hyperlipidemia   . Impaired glucose tolerance   . Obesity     Past Surgical History:  Procedure Laterality Date  . COLONOSCOPY N/A 03/19/2018   Procedure: COLONOSCOPY;  Surgeon: Danie Binder, MD;  [facility];  Service: Endoscopy;  Laterality: N/A;  2:00  . OOPHORECTOMY    . TUBAL LIGATION      Current Outpatient Medications  Medication Sig Dispense Refill  . atorvastatin (LIPITOR) 40 MG tablet Take 1 tablet (40 mg total) by mouth daily. 30 tablet 11  . carvedilol (COREG) 25 MG tablet Take 1 tablet (25 mg total) by mouth 2 (two) times daily. 60 tablet 6  . furosemide (LASIX) 40 MG tablet Take 40 mg daily if you gain more than 2- 3 lbs in 24 hours 60 tablet 3  . ranitidine (ZANTAC) 300 MG tablet Take 1 tablet (300 mg total) by mouth at bedtime. 30 tablet 5  . losartan (COZAAR) 25 MG tablet Take 0.5  tablets (12.5 mg total) by mouth daily. 45 tablet 3   No current facility-administered medications for this visit.    Allergies:  Patient has no known allergies.   Social History: The patient  reports that she has never smoked. She has never used smokeless tobacco. She reports that she drinks alcohol. She reports that she does not use drugs.   ROS:  Please see the history of present illness. Otherwise, complete review of systems is positive for cough.  All other systems are reviewed and negative.   Physical Exam: VS:  BP 126/82   Pulse (!) 102 Comment: coughing  Wt 223 lb (101.2 kg)   SpO2 95%   BMI 40.79 kg/m , BMI Body mass index is 40.79 kg/m.  Wt Readings from Last 3 Encounters:  09/09/18 223 lb (101.2 kg)  08/12/18 228 lb (103.4 kg)  08/02/18 228 lb 0.6 oz (103.4 kg)    General: Patient appears comfortable at rest. HEENT: Conjunctiva and lids normal, oropharynx clear. Neck: Supple, no elevated JVP or carotid bruits, no thyromegaly. Lungs: Clear to auscultation, nonlabored breathing at rest. Cardiac: Regular rate and rhythm, no S3 or significant systolic murmur. Abdomen: Soft, nontender, bowel sounds present. Extremities: No pitting edema, distal pulses 2+. Skin: Warm and dry. Musculoskeletal: No kyphosis. Neuropsychiatric: Alert and oriented x3, affect grossly appropriate.  ECG: I personally reviewed the tracing from 03/26/2017 which showed sinus  tachycardia with PAC and increased voltage.  Recent Labwork: 08/02/2018: ALT 38; AST 34; Brain Natriuretic Peptide 74; BUN 16; Creat 0.89; Hemoglobin 11.8; Platelets 487; Potassium 4.3; Sodium 136; TSH 2.72     Component Value Date/Time   CHOL 165 08/02/2018 1631   TRIG 84 08/02/2018 1631   HDL 45 (L) 08/02/2018 1631   CHOLHDL 3.7 08/02/2018 1631   VLDL 26 01/15/2017 1816   LDLCALC 103 (H) 08/02/2018 1631    Other Studies Reviewed Today:  Echocardiogram 08/18/2018: Study Conclusions  - Left ventricle: The cavity size  was normal. Wall thickness was   increased in a pattern of moderate LVH. Systolic function was   mildly reduced. The estimated ejection fraction was in the range   of 45% to 50%. Doppler parameters are consistent with abnormal   left ventricular relaxation (grade 1 diastolic dysfunction). - Aortic valve: Valve area (VTI): 1.86 cm^2. Valve area (Vmax):   2.01 cm^2. Valve area (Vmean): 1.97 cm^2. - Mitral valve: There was mild regurgitation. - Technically difficult study. Echocontrast was used to enhance   visualization.  Assessment and Plan:  1.  Suspected nonischemic cardiomyopathy, follow-up echocardiogram showing moderate LVH with LVEF 45 to 50% range which represents an improvement compared to previous testing.  Plan is to increase Coreg to 25 mg twice daily, switch from Entresto to Cozaar at 12.5 mg daily, use Lasix on an as-needed basis, and then follow-up BMET in 1 week.  Next step would be further advancing Cozaar or perhaps adding Aldactone.  2.  Essential hypertension, blood pressure is adequately controlled today.  3.  Hyperlipidemia on Lipitor.  She continues to follow with Dr. Moshe Cipro.  Current medicines were reviewed with the patient today.   Orders Placed This Encounter  Procedures  . Basic Metabolic Panel (BMET)    Disposition: Follow-up in 6 to 8 weeks.  Signed, Satira Sark, MD, [facility] 09/09/2018 4:17 PM    [facility] [address], [place], [place] [address] Phone: [phone]; Fax: [phone]

## 2018-09-12 ENCOUNTER — Inpatient Hospital Stay (HOSPITAL_COMMUNITY)
Admission: EM | Admit: 2018-09-12 | Discharge: 2018-09-15 | DRG: 193 | Disposition: A | Discharge status: Home / Self Care | Payer: BLUE CROSS/BLUE SHIELD | Attending: Internal Medicine | Admitting: Internal Medicine

## 2018-09-12 ENCOUNTER — Inpatient Hospital Stay (HOSPITAL_COMMUNITY): Payer: BLUE CROSS/BLUE SHIELD

## 2018-09-12 ENCOUNTER — Other Ambulatory Visit: Payer: Self-pay

## 2018-09-12 ENCOUNTER — Encounter (HOSPITAL_COMMUNITY): Payer: Self-pay | Admitting: Emergency Medicine

## 2018-09-12 ENCOUNTER — Emergency Department (HOSPITAL_COMMUNITY): Payer: BLUE CROSS/BLUE SHIELD

## 2018-09-12 DIAGNOSIS — E662 Morbid (severe) obesity with alveolar hypoventilation: Secondary | ICD-10-CM | POA: Diagnosis present

## 2018-09-12 DIAGNOSIS — Z6841 Body Mass Index (BMI) 40.0 and over, adult: Secondary | ICD-10-CM | POA: Diagnosis not present

## 2018-09-12 DIAGNOSIS — I110 Hypertensive heart disease with heart failure: Secondary | ICD-10-CM | POA: Diagnosis present

## 2018-09-12 DIAGNOSIS — I248 Other forms of acute ischemic heart disease: Secondary | ICD-10-CM | POA: Diagnosis present

## 2018-09-12 DIAGNOSIS — E785 Hyperlipidemia, unspecified: Secondary | ICD-10-CM | POA: Diagnosis present

## 2018-09-12 DIAGNOSIS — I429 Cardiomyopathy, unspecified: Secondary | ICD-10-CM | POA: Diagnosis present

## 2018-09-12 DIAGNOSIS — I5042 Chronic combined systolic (congestive) and diastolic (congestive) heart failure: Secondary | ICD-10-CM | POA: Diagnosis present

## 2018-09-12 DIAGNOSIS — J181 Lobar pneumonia, unspecified organism: Secondary | ICD-10-CM | POA: Diagnosis present

## 2018-09-12 DIAGNOSIS — E66813 Obesity, class 3: Secondary | ICD-10-CM

## 2018-09-12 DIAGNOSIS — J189 Pneumonia, unspecified organism: Secondary | ICD-10-CM | POA: Diagnosis present

## 2018-09-12 DIAGNOSIS — J9601 Acute respiratory failure with hypoxia: Secondary | ICD-10-CM | POA: Diagnosis present

## 2018-09-12 LAB — BASIC METABOLIC PANEL
ANION GAP: 7 (ref 5–15)
BUN: 15 mg/dL (ref 6–20)
CHLORIDE: 102 mmol/L (ref 98–111)
CO2: 28 mmol/L (ref 22–32)
Calcium: 8.7 mg/dL — ABNORMAL LOW (ref 8.9–10.3)
Creatinine, Ser: 0.85 mg/dL (ref 0.44–1.00)
GFR calc Af Amer: >60 mL/min (ref >60)
GFR calc non Af Amer: >60 mL/min (ref >60)
GLUCOSE: 91 mg/dL (ref 70–99)
POTASSIUM: 3.8 mmol/L (ref 3.5–5.1)
Sodium: 137 mmol/L (ref 135–145)

## 2018-09-12 LAB — HEPATIC FUNCTION PANEL
ALT: 55 U/L — AB (ref 0–44)
AST: 38 U/L (ref 15–41)
Albumin: 3.1 g/dL — ABNORMAL LOW (ref 3.5–5.0)
Alkaline Phosphatase: 84 U/L (ref 38–126)
BILIRUBIN DIRECT: 0.1 mg/dL (ref 0.0–0.2)
Indirect Bilirubin: 0.5 mg/dL (ref 0.3–0.9)
Total Bilirubin: 0.6 mg/dL (ref 0.3–1.2)
Total Protein: 9.1 g/dL — ABNORMAL HIGH (ref 6.5–8.1)

## 2018-09-12 LAB — CBC
HCT: 40.5 % (ref 36.0–46.0)
HEMOGLOBIN: 12.1 g/dL (ref 12.0–15.0)
MCH: 26.9 pg (ref 26.0–34.0)
MCHC: 29.9 g/dL — ABNORMAL LOW (ref 30.0–36.0)
MCV: 90 fL (ref 80.0–100.0)
Platelets: 402 10*3/uL — ABNORMAL HIGH (ref 150–400)
RBC: 4.5 MIL/uL (ref 3.87–5.11)
RDW: 15.9 % — ABNORMAL HIGH (ref 11.5–15.5)
WBC: 5.3 10*3/uL (ref 4.0–10.5)
nRBC: 0 % (ref 0.0–0.2)

## 2018-09-12 LAB — DIFFERENTIAL
Abs Immature Granulocytes: 0.01 10*3/uL (ref 0.00–0.07)
Basophils Absolute: 0 10*3/uL (ref 0.0–0.1)
Basophils Relative: 0 %
EOS PCT: 4 %
Eosinophils Absolute: 0.2 10*3/uL (ref 0.0–0.5)
IMMATURE GRANULOCYTES: 0 %
LYMPHS PCT: 18 %
Lymphs Abs: 1 10*3/uL (ref 0.7–4.0)
MONO ABS: 0.4 10*3/uL (ref 0.1–1.0)
Monocytes Relative: 8 %
NEUTROS ABS: 3.8 10*3/uL (ref 1.7–7.7)
Neutrophils Relative %: 70 %

## 2018-09-12 LAB — BRAIN NATRIURETIC PEPTIDE: B Natriuretic Peptide: 61 pg/mL (ref 0.0–100.0)

## 2018-09-12 LAB — D-DIMER, QUANTITATIVE (NOT AT ARMC): D DIMER QUANT: 3.14 ug{FEU}/mL — AB (ref 0.00–0.50)

## 2018-09-12 LAB — TROPONIN I: Troponin I: 0.03 ng/mL (ref <0.03)

## 2018-09-12 MED ORDER — ONDANSETRON HCL 4 MG PO TABS: 4.0000 mg | ORAL_TABLET | Freq: Four times a day (QID) | ORAL | Status: DC | PRN

## 2018-09-12 MED ORDER — ENOXAPARIN SODIUM 40 MG/0.4ML ~~LOC~~ SOLN
40.0000 mg | SUBCUTANEOUS | Status: DC
  Administered 2018-09-12 – 2018-09-14 (×3): 40 mg via SUBCUTANEOUS
  Filled 2018-09-12 (×3): qty 0.4

## 2018-09-12 MED ORDER — HYDRALAZINE HCL 20 MG/ML IJ SOLN: 10.0000 mg | INTRAMUSCULAR | Status: DC | PRN

## 2018-09-12 MED ORDER — CARVEDILOL 12.5 MG PO TABS
25.0000 mg | ORAL_TABLET | Freq: Two times a day (BID) | ORAL | Status: DC
  Administered 2018-09-12 – 2018-09-15 (×6): 25 mg via ORAL
  Filled 2018-09-12 (×7): qty 2

## 2018-09-12 MED ORDER — DM-GUAIFENESIN ER 30-600 MG PO TB12
1.0000 | ORAL_TABLET | Freq: Two times a day (BID) | ORAL | Status: DC
  Administered 2018-09-12 – 2018-09-15 (×6): 1 via ORAL
  Filled 2018-09-12 (×6): qty 1

## 2018-09-12 MED ORDER — IOPAMIDOL (ISOVUE-370) INJECTION 76%
100.0000 mL | Freq: Once | INTRAVENOUS | Status: AC | PRN
  Administered 2018-09-12: 100 mL via INTRAVENOUS

## 2018-09-12 MED ORDER — ALBUTEROL SULFATE (2.5 MG/3ML) 0.083% IN NEBU
5.0000 mg | INHALATION_SOLUTION | Freq: Once | RESPIRATORY_TRACT | Status: AC
  Administered 2018-09-12: 5 mg via RESPIRATORY_TRACT
  Filled 2018-09-12: qty 6

## 2018-09-12 MED ORDER — POLYETHYLENE GLYCOL 3350 17 G PO PACK
17.0000 g | PACK | Freq: Every day | ORAL | Status: DC | PRN
  Administered 2018-09-14: 17 g via ORAL
  Filled 2018-09-12: qty 1

## 2018-09-12 MED ORDER — SODIUM CHLORIDE 0.9 % IV SOLN
1.0000 g | INTRAVENOUS | Status: DC
  Administered 2018-09-13 – 2018-09-15 (×3): 1 g via INTRAVENOUS
  Filled 2018-09-12: qty 10
  Filled 2018-09-12 (×2): qty 1
  Filled 2018-09-12: qty 10
  Filled 2018-09-12: qty 1

## 2018-09-12 MED ORDER — IPRATROPIUM-ALBUTEROL 0.5-2.5 (3) MG/3ML IN SOLN: 3.0000 mL | Freq: Four times a day (QID) | RESPIRATORY_TRACT | Status: DC | PRN

## 2018-09-12 MED ORDER — LEVOFLOXACIN IN D5W 500 MG/100ML IV SOLN
500.0000 mg | Freq: Once | INTRAVENOUS | Status: AC
  Administered 2018-09-12: 500 mg via INTRAVENOUS
  Filled 2018-09-12: qty 100

## 2018-09-12 MED ORDER — SODIUM CHLORIDE 0.9 % IV SOLN
100.0000 mg | Freq: Two times a day (BID) | INTRAVENOUS | Status: DC
  Administered 2018-09-13 – 2018-09-15 (×5): 100 mg via INTRAVENOUS
  Filled 2018-09-12 (×9): qty 100

## 2018-09-12 MED ORDER — ATORVASTATIN CALCIUM 40 MG PO TABS
40.0000 mg | ORAL_TABLET | Freq: Every day | ORAL | Status: DC
  Administered 2018-09-12 – 2018-09-15 (×4): 40 mg via ORAL
  Filled 2018-09-12 (×5): qty 1

## 2018-09-12 MED ORDER — ONDANSETRON HCL 4 MG/2ML IJ SOLN: 4.0000 mg | Freq: Four times a day (QID) | INTRAMUSCULAR | Status: DC | PRN

## 2018-09-12 NOTE — H&P (Signed)
History and Physical    Michelle David YBO:[id number] DOB: 1960/10/30 DOA: 09/12/2018  PCP: Fayrene Helper, MD   Patient coming from: Home  Chief Complaint: Redness of breath, cough  HPI: Michelle David is a 57 y.o. female with medical history significant for diastolic and systolic CHF, HTN, who presented to the ED complaints of shortness of breath and cough productive of yellowish sputum of 2 to 3 months.  She denies chest pain.  No lower extremity swelling redness or pain.  No recent trips.. Denies fevers or chills.   ED Course: tachycardia - 115, intermittent tachypnea to 32, O2 sats initially 88% on room air dropped to 84% with ambulation, with 2 L O2.  Two-view chest x-ray right lower lobe pneumonia small-to-moderate parapneumonic effusion.  WBC normal 5.3.  Elevated d-dimer 3.14.  Normal BNP 61.  Cultures obtained just after antibiotics given in ED.  Considering elevated d-dimer CTA chest was ordered.  IV Levaquin started for community-acquired pneumonia.  Review of Systems: As per HPI all other systems reviewed and negative  Past Medical History:  Diagnosis Date  . Cardiomyopathy [facility])    Diagnosed March 2018  . CHF (congestive heart failure) ([place])   . Dyspepsia   . Essential hypertension   . Hyperlipidemia   . Impaired glucose tolerance   . Obesity     Past Surgical History:  Procedure Laterality Date  . COLONOSCOPY N/A 03/19/2018   Procedure: COLONOSCOPY;  Surgeon: Danie Binder, MD;  [facility];  Service: Endoscopy;  Laterality: N/A;  2:00  . OOPHORECTOMY    . TUBAL LIGATION       reports that she has never smoked. She has never used smokeless tobacco. She reports that she drinks alcohol. She reports that she does not use drugs.  No Known Allergies  Family History  Problem Relation Age of Onset  . Heart disease Mother        Died in her 40s with MI  . Hypertension Mother   . Lung cancer Maternal Aunt     Prior to Admission  medications   Medication Sig Start Date End Date Taking? Authorizing Provider  carvedilol (COREG) 25 MG tablet Take 1 tablet (25 mg total) by mouth 2 (two) times daily. 09/09/18  Yes Satira Sark, MD  furosemide (LASIX) 40 MG tablet Take 40 mg daily if you gain more than 2- 3 lbs in 24 hours Patient taking differently: Take 40 mg by mouth daily as needed for fluid or edema (for weight gain of 2-3 pounds). Take 40 mg daily if you gain more than 2- 3 lbs in 24 hours 09/09/18  Yes Satira Sark, MD  losartan (COZAAR) 25 MG tablet Take 0.5 tablets (12.5 mg total) by mouth daily. 09/09/18 12/08/18 Yes Satira Sark, MD  atorvastatin (LIPITOR) 40 MG tablet Take 1 tablet (40 mg total) by mouth daily. Patient not taking: Reported on 09/12/2018 06/22/18   Fayrene Helper, MD    Physical Exam: Vitals:   09/12/18 1630 09/12/18 1645 09/12/18 1730 09/12/18 1742  BP:   (!) 154/81 (!) 178/89  Pulse: (!) 107 (!) 110 (!) 114 90  Resp: (!) 32 (!) 28 (!) 23 18  Temp:    98.1 F (36.7 C)  TempSrc:    Oral  SpO2: 93% 93% 94% 96%  Weight:      Height:        Constitutional: NAD, calm, comfortable Vitals:   09/12/18 1630 09/12/18 1645 09/12/18  1730 09/12/18 1742  BP:   (!) 154/81 (!) 178/89  Pulse: (!) 107 (!) 110 (!) 114 90  Resp: (!) 32 (!) 28 (!) 23 18  Temp:    98.1 F (36.7 C)  TempSrc:    Oral  SpO2: 93% 93% 94% 96%  Weight:      Height:       Eyes: PERRL, lids and conjunctivae normal ENMT: Mucous membranes are moist. Posterior pharynx clear of any exudate or lesions.  Neck: normal, supple, no masses, no thyromegaly Respiratory: clear to auscultation bilaterally, no wheezing, no crackles. Normal respiratory effort. No accessory muscle use.  Cardiovascular: Mild Tachycardia but regular rate and rhythm, no murmurs / rubs / gallops. No extremity edema. 2+ pedal pulses.  Abdomen: no tenderness, no masses palpated. No hepatosplenomegaly. Bowel sounds positive.  Musculoskeletal:  no clubbing / cyanosis. No joint deformity upper and lower extremities. Good ROM, no contractures. Normal muscle tone.  Skin: no rashes, lesions, ulcers. No induration Neurologic: CN 2-12 grossly intact. Strength 5/5 in all 4.  Psychiatric: Normal judgment and insight. Alert and oriented x 3. Normal mood.    Labs on Admission: I have personally reviewed following labs and imaging studies  CBC: Recent Labs  Lab 09/12/18 1547  WBC 5.3  NEUTROABS 3.8  HGB 12.1  HCT 40.5  MCV 90.0  PLT 517*   Basic Metabolic Panel: Recent Labs  Lab 09/12/18 1547  NA 137  K 3.8  CL 102  CO2 28  GLUCOSE 91  BUN 15  CREATININE 0.85  CALCIUM 8.7*   Liver Function Tests: Recent Labs  Lab 09/12/18 1547  AST 38  ALT 55*  ALKPHOS 84  BILITOT 0.6  PROT 9.1*  ALBUMIN 3.1*   Cardiac Enzymes: Recent Labs  Lab 09/12/18 1547  TROPONINI 0.03*   Urine analysis:    Component Value Date/Time   COLORURINE YELLOW 08/05/2016 [place] 08/05/2016 1414   LABSPEC 1.020 08/05/2016 1414   PHURINE 6.0 08/05/2016 1414   GLUCOSEU NEGATIVE 08/05/2016 1414   GLUCOSEU NEG mg/dL 12/11/2007 0434   HGBUR TRACE (A) 08/05/2016 1414   HGBUR trace-intact 03/04/2010 0000   BILIRUBINUR NEGATIVE 08/05/2016 1414   BILIRUBINUR neg 04/10/2016 1529   KETONESUR NEGATIVE 08/05/2016 1414   PROTEINUR NEGATIVE 08/05/2016 1414   UROBILINOGEN 1.0 04/10/2016 1529   UROBILINOGEN 1.0 03/04/2010 0000   NITRITE NEGATIVE 08/05/2016 1414   LEUKOCYTESUR TRACE (A) 08/05/2016 1414    Radiological Exams on Admission: Dg Chest 2 View  Result Date: 09/12/2018 CLINICAL DATA:  57 year old female with shortness of breath for the past 3 months worsening today. History of congestive heart failure. EXAM: CHEST - 2 VIEW COMPARISON:  Chest x-ray 08/02/2018. FINDINGS: Airspace consolidation in the right lower lobe. Small to moderate right parapneumonic pleural effusion. Left lung is clear. Trace left pleural effusion. No  evidence of pulmonary edema. Heart size is normal. IMPRESSION: 1. Right lower lobe pneumonia with small to moderate parapneumonic pleural effusion. Followup PA and lateral chest X-ray is recommended in 3-4 weeks following trial of antibiotic therapy to ensure resolution and exclude underlying malignancy. 2. Trace left pleural effusion. Electronically Signed   By: Vinnie Langton M.D.   On: 09/12/2018 17:28    EKG: Independently reviewed.  Sinus rhythm.  Rate 86.  QTc mildly prolonged 486.  Assessment/Plan Active Problems:   Community acquired pneumonia   Community Acquired pneumonia-productive cough, shortness of breath.  Hypoxia to 84% with activity.  Not on home O2. WBC-  5.3.  Troponin 0.03.  Elevated d-dimer 3.14.  BNP normal 61.  Two-view chest x-ray RLL PNA, mild to moderate parapneumonic effusion. IV Levaquin given in ED. CTA chest ordered and pending -Follow-up CTA chest- r/o PE -Considering mild prolonged QTC, IV ceftriaxone and IV doxycycline -Mucolytics, DUonebs  Elevated troponin- 0.03. No Chest pain.  EKG doubt ST or T wave abnormalities.  Unchanged from prior. -Trend troponin  Combined systolic diastolic CHF-appears stable.  BNP- 61, normal compared to prior.  On Lasix 40 mg as needed.  Last echo 07/2018 shows improved EF 45 to 50%, G1DD, from 2018-30 to 35%.  Follows with Dr. Conni Elliot. - Hold lasix for now, and Losartan with contrast exposure - Cont home coreg 25 BID  HTN- Elevated - Hold lasix and lorsartan for now - PRN hydralazine - Cont Hom coreg  HIV as part of routine health screening  DVT prophylaxis: Lovenox Code Status: Full Family Communication: None at bedside Disposition Plan: Per rounding team Consults called: None Admission status: Inpt, tele   Bethena Roys MD [facility] Pager [phone] From 3PM-11PM.  Otherwise please contact night-coverage [email or web address] Password [facility]  09/12/2018, 8:45 PM

## 2018-09-12 NOTE — ED Notes (Signed)
Pt ambulated to bathroom without oxygen as she was feeling better and returned short of breath with sats of 84%.  Placed back on 2L [place] with improvement in s/s and O2 sat.

## 2018-09-12 NOTE — ED Notes (Signed)
Patient transported to CT 

## 2018-09-12 NOTE — ED Notes (Signed)
Date and time results received: 09/12/18 4:51 PM  (use smartphrase ".now" to insert current time)  Test: Troponin Critical Value: 0.03  Name of Provider Notified: [place]  Orders Received? Or Actions Taken?: Orders Received - See Orders for details

## 2018-09-12 NOTE — ED Triage Notes (Signed)
Patient c/o shortness of breath x2-3 months that is progressively getting worse. Per patient sever shortness of breath with exertion. Patient reports hx of CHF. Denies any swelling in legs. Patient states soreness in chest and back from productive cough. Per patient thick yellow sputum. Denies any fevers. Patient reports urinary inconstancy with coughing.

## 2018-09-12 NOTE — ED Notes (Signed)
Pt refused to be stuck for blood cultures at this time.

## 2018-09-12 NOTE — ED Provider Notes (Addendum)
[facility] EMERGENCY DEPARTMENT Provider Note   CSN: [id number] Arrival date & time: 09/12/18  1505     History   Chief Complaint Chief Complaint  Patient presents with  . Shortness of Breath    HPI Michelle David is a 57 y.o. female.  Patient complains of shortness of breath.  Also coughing up yellow-green sputum  The history is provided by the patient. No language interpreter was used.  Shortness of Breath  This is a new problem. The problem occurs rarely.The current episode started more than 1 week ago. The problem has not changed since onset.Pertinent negatives include no fever, no headaches, no cough, no chest pain, no abdominal pain and no rash. Precipitated by: Unknown. Risk factors: Congestive heart failure. She has tried nothing for the symptoms. She has had prior hospitalizations. She has had no prior ED visits. She has had no prior ICU admissions. Associated medical issues do not include asthma.    Past Medical History:  Diagnosis Date  . Cardiomyopathy [facility])    Diagnosed March 2018  . CHF (congestive heart failure) ([place])   . Dyspepsia   . Essential hypertension   . Hyperlipidemia   . Impaired glucose tolerance   . Obesity     Patient Active Problem List   Diagnosis Date Noted  . Annual physical exam 08/15/2018  . Osteoarthritis of both knees 01/22/2018  . Snoring 01/22/2018  . Poor dentition 08/20/2017  . Allergic rhinitis 06/13/2017  . Chronic combined systolic and diastolic CHF (congestive heart failure) ([place]) 03/30/2017  . Sleep apnea 12/17/2016  . Cough 12/17/2016  . Solitary thyroid nodule 03/01/2015  . Special screening for malignant neoplasms, colon 04/24/2014  . Seasonal allergies 04/24/2014  . Hyperlipidemia LDL goal <100 05/29/2013  . Non-compliant behavior 05/29/2013  . Heart murmur 04/22/2011  . Morbid obesity 03/31/2008  . HTN (hypertension), malignant 03/31/2008  . DYSPEPSIA 03/31/2008    Past Surgical History:  Procedure  Laterality Date  . COLONOSCOPY N/A 03/19/2018   Procedure: COLONOSCOPY;  Surgeon: Danie Binder, MD;  Location: [facility] ENDO SUITE;  Service: Endoscopy;  Laterality: N/A;  2:00  . OOPHORECTOMY    . TUBAL LIGATION       OB History   None      Home Medications    Prior to Admission medications   Medication Sig Start Date End Date Taking? Authorizing Provider  carvedilol (COREG) 25 MG tablet Take 1 tablet (25 mg total) by mouth 2 (two) times daily. 09/09/18  Yes Satira Sark, MD  furosemide (LASIX) 40 MG tablet Take 40 mg daily if you gain more than 2- 3 lbs in 24 hours Patient taking differently: Take 40 mg by mouth daily as needed for fluid or edema (for weight gain of 2-3 pounds). Take 40 mg daily if you gain more than 2- 3 lbs in 24 hours 09/09/18  Yes Satira Sark, MD  losartan (COZAAR) 25 MG tablet Take 0.5 tablets (12.5 mg total) by mouth daily. 09/09/18 12/08/18 Yes Satira Sark, MD  atorvastatin (LIPITOR) 40 MG tablet Take 1 tablet (40 mg total) by mouth daily. Patient not taking: Reported on 09/12/2018 06/22/18   Fayrene Helper, MD    Family History Family History  Problem Relation Age of Onset  . Heart disease Mother        Died in her 107s with MI  . Hypertension Mother   . Lung cancer Maternal Aunt     Social History Social History  Tobacco Use  . Smoking status: Never Smoker  . Smokeless tobacco: Never Used  Substance Use Topics  . Alcohol use: Yes    Comment: special occasions  . Drug use: No     Allergies   Patient has no known allergies.   Review of Systems Review of Systems  Constitutional: Negative for appetite change, fatigue and fever.  HENT: Negative for congestion, ear discharge and sinus pressure.   Eyes: Negative for discharge.  Respiratory: Positive for shortness of breath. Negative for cough.   Cardiovascular: Negative for chest pain.  Gastrointestinal: Negative for abdominal pain and diarrhea.  Genitourinary: Negative  for frequency and hematuria.  Musculoskeletal: Negative for back pain.  Skin: Negative for rash.  Neurological: Negative for seizures and headaches.  Psychiatric/Behavioral: Negative for hallucinations.     Physical Exam Updated Vital Signs BP (!) 178/89 (BP Location: Right Arm)   Pulse 90   Temp 98.1 F (36.7 C) (Oral)   Resp 18   Ht 5\' 2"  (1.575 m)   Wt 101.2 kg   SpO2 96%   BMI 40.79 kg/m   Physical Exam  Constitutional: She is oriented to person, place, and time. She appears well-developed.  HENT:  Head: Normocephalic.  Eyes: Conjunctivae and EOM are normal. No scleral icterus.  Neck: Neck supple. No thyromegaly present.  Cardiovascular: Normal rate and regular rhythm. Exam reveals no gallop and no friction rub.  No murmur heard. Pulmonary/Chest: No stridor. She has no wheezes. She has no rales. She exhibits no tenderness.  Abdominal: She exhibits no distension. There is no tenderness. There is no rebound.  Musculoskeletal: Normal range of motion. She exhibits no edema.  Lymphadenopathy:    She has no cervical adenopathy.  Neurological: She is oriented to person, place, and time. She exhibits normal muscle tone. Coordination normal.  Skin: No rash noted. No erythema.  Psychiatric: She has a normal mood and affect. Her behavior is normal.     ED Treatments / Results  Labs (all labs ordered are listed, but only abnormal results are displayed) Labs Reviewed  BASIC METABOLIC PANEL - Abnormal; Notable for the following components:      Result Value   Calcium 8.7 (*)    All other components within normal limits  CBC - Abnormal; Notable for the following components:   MCHC 29.9 (*)    RDW 15.9 (*)    Platelets 402 (*)    All other components within normal limits  TROPONIN I - Abnormal; Notable for the following components:   Troponin I 0.03 (*)    All other components within normal limits  HEPATIC FUNCTION PANEL - Abnormal; Notable for the following components:    Total Protein 9.1 (*)    Albumin 3.1 (*)    ALT 55 (*)    All other components within normal limits  D-DIMER, QUANTITATIVE (NOT AT [place]) - Abnormal; Notable for the following components:   D-Dimer, Quant 3.14 (*)    All other components within normal limits  CULTURE, BLOOD (ROUTINE X 2)  CULTURE, BLOOD (ROUTINE X 2)  DIFFERENTIAL  BRAIN NATRIURETIC PEPTIDE    EKG None  Radiology Dg Chest 2 View  Result Date: 09/12/2018 CLINICAL DATA:  57 year old female with shortness of breath for the past 3 months worsening today. History of congestive heart failure. EXAM: CHEST - 2 VIEW COMPARISON:  Chest x-ray 08/02/2018. FINDINGS: Airspace consolidation in the right lower lobe. Small to moderate right parapneumonic pleural effusion. Left lung is clear. Trace left pleural  effusion. No evidence of pulmonary edema. Heart size is normal. IMPRESSION: 1. Right lower lobe pneumonia with small to moderate parapneumonic pleural effusion. Followup PA and lateral chest X-ray is recommended in 3-4 weeks following trial of antibiotic therapy to ensure resolution and exclude underlying malignancy. 2. Trace left pleural effusion. Electronically Signed   By: Vinnie Langton M.D.   On: 09/12/2018 17:28    Procedures Procedures (including critical care time)  Medications Ordered in ED Medications  levofloxacin (LEVAQUIN) IVPB 500 mg ( Intravenous Rate/Dose Verify 09/12/18 1854)  albuterol (PROVENTIL) (2.5 MG/3ML) 0.083% nebulizer solution 5 mg (5 mg Nebulization Given 09/12/18 1601)     Initial Impression / Assessment and Plan / ED Course  I have reviewed the triage vital signs and the nursing notes.  Pertinent labs & imaging results that were available during my care of the patient were reviewed by me and considered in my medical decision making (see chart for details).      EKG Interpretation  Date/Time:    Ventricular Rate:    PR Interval:    QRS Duration:   QT Interval:    QTC Calculation:   R  Axis:     Text Interpretation:        Ekg, vr 85, pr 20, qtc 486, nl axis, poor r wave progression with nsr  Patient with pneumonia and hypoxia.  Will be admitted to medicine she also will get a CT angios of the chest  Final Clinical Impressions(s) / ED Diagnoses   Final diagnoses:  Community acquired pneumonia of right lower lobe of lung [facility])    ED Discharge Orders    None       Milton Ferguson, MD 09/12/18 1911    Milton Ferguson, MD 09/19/18 [phone]

## 2018-09-12 NOTE — ED Notes (Signed)
Pt eating [employer] brought by family.

## 2018-09-12 NOTE — ED Notes (Signed)
ED TO INPATIENT HANDOFF REPORT  Name/Age/Gender Michelle David 57 y.o. female  Code Status Code Status History    Date Active Date Inactive Code Status Order ID Comments User Context   01/15/2017 1806 01/20/2017 1419 Full Code [id number]  Karmen Bongo, MD Inpatient      Home/SNF/Other Home  Chief Complaint shortness of breath  Level of Care/Admitting Diagnosis ED Disposition    ED Disposition Condition [place]: [facility] [[id number]]  Level of Care: Telemetry [5]  Diagnosis: Community acquired pneumonia [063016]  Admitting Physician: Bethena Roys [[id number]]  Attending Physician: Bethena Roys [phone]  Estimated length of stay: past midnight tomorrow  Certification:: I certify this patient will need inpatient services for at least 2 midnights  PT Class (Do Not Modify): Inpatient [101]  PT Acc Code (Do Not Modify): Private [1]       Medical History Past Medical History:  Diagnosis Date  . Cardiomyopathy [facility])    Diagnosed March 2018  . CHF (congestive heart failure) ([place])   . Dyspepsia   . Essential hypertension   . Hyperlipidemia   . Impaired glucose tolerance   . Obesity     Allergies No Known Allergies  IV Location/Drains/Wounds Patient Lines/Drains/Airways Status   Active Line/Drains/Airways    Name:   Placement date:   Placement time:   Site:   Days:   Peripheral IV 07/24/17 Right Antecubital   07/24/17    1515    Antecubital   415   Peripheral IV 09/12/18 Right Antecubital   09/12/18    1552    Antecubital   less than 1          Labs/Imaging Results for orders placed or performed during the hospital encounter of 09/12/18 (from the past 48 hour(s))  Basic metabolic panel     Status: Abnormal   Collection Time: 09/12/18  3:47 PM  Result Value Ref Range   Sodium 137 135 - 145 mmol/L   Potassium 3.8 3.5 - 5.1 mmol/L   Chloride 102 98 - 111 mmol/L   CO2 28 22 - 32 mmol/L   Glucose, Bld 91 70 - 99 mg/dL    BUN 15 6 - 20 mg/dL   Creatinine, Ser 0.85 0.44 - 1.00 mg/dL   Calcium 8.7 (L) 8.9 - 10.3 mg/dL   GFR calc non Af Amer >60 >60 mL/min   GFR calc Af Amer >60 >60 mL/min    Comment: (NOTE) The eGFR has been calculated using the CKD EPI equation. This calculation has not been validated in all clinical situations. eGFR's persistently <60 mL/min signify possible Chronic Kidney Disease.    Anion gap 7 5 - 15    Comment: Performed at [facility], [address], [place], [place] [address]  CBC     Status: Abnormal   Collection Time: 09/12/18  3:47 PM  Result Value Ref Range   WBC 5.3 4.0 - 10.5 K/uL   RBC 4.50 3.87 - 5.11 MIL/uL   Hemoglobin 12.1 12.0 - 15.0 g/dL   HCT 40.5 36.0 - 46.0 %   MCV 90.0 80.0 - 100.0 fL   MCH 26.9 26.0 - 34.0 pg   MCHC 29.9 (L) 30.0 - 36.0 g/dL   RDW 15.9 (H) 11.5 - 15.5 %   Platelets 402 (H) 150 - 400 K/uL   nRBC 0.0 0.0 - 0.2 %    Comment: Performed at [facility], [address], [place], [place] [address]  Troponin  I - ONCE - STAT     Status: Abnormal   Collection Time: 09/12/18  3:47 PM  Result Value Ref Range   Troponin I 0.03 (HH) <0.03 ng/mL    Comment: CRITICAL RESULT CALLED TO, READ BACK BY AND VERIFIED WITH: CREWS,M. AT 1651 ON 09/12/2018 BY EVA Performed at [facility], [address] [place], [place] [address]   Hepatic function panel     Status: Abnormal   Collection Time: 09/12/18  3:47 PM  Result Value Ref Range   Total Protein 9.1 (H) 6.5 - 8.1 g/dL   Albumin 3.1 (L) 3.5 - 5.0 g/dL   AST 38 15 - 41 U/L   ALT 55 (H) 0 - 44 U/L   Alkaline Phosphatase 84 38 - 126 U/L   Total Bilirubin 0.6 0.3 - 1.2 mg/dL   Bilirubin, Direct 0.1 0.0 - 0.2 mg/dL   Indirect Bilirubin 0.5 0.3 - 0.9 mg/dL    Comment: Performed at [facility], [address], [place], [place] [address]  Differential     Status: None   Collection Time: 09/12/18  3:47 PM  Result Value Ref Range   Neutrophils Relative % 70 %   Neutro Abs 3.8 1.7 - 7.7 K/uL    Lymphocytes Relative 18 %   Lymphs Abs 1.0 0.7 - 4.0 K/uL   Monocytes Relative 8 %   Monocytes Absolute 0.4 0.1 - 1.0 K/uL   Eosinophils Relative 4 %   Eosinophils Absolute 0.2 0.0 - 0.5 K/uL   Basophils Relative 0 %   Basophils Absolute 0.0 0.0 - 0.1 K/uL   Immature Granulocytes 0 %   Abs Immature Granulocytes 0.01 0.00 - 0.07 K/uL    Comment: Performed at [facility], [address] [place], [place] [address]  Brain natriuretic peptide     Status: None   Collection Time: 09/12/18  3:47 PM  Result Value Ref Range   B Natriuretic Peptide 61.0 0.0 - 100.0 pg/mL    Comment: Performed at [facility], [address] [place], [place] [address]  D-dimer, quantitative (not at [facility])     Status: Abnormal   Collection Time: 09/12/18  3:47 PM  Result Value Ref Range   D-Dimer, Quant 3.14 (H) 0.00 - 0.50 ug/mL-FEU    Comment: (NOTE) At the manufacturer cut-off of 0.50 ug/mL FEU, this assay has been documented to exclude PE with a sensitivity and negative predictive value of 97 to 99%.  At this time, this assay has not been approved by the FDA to exclude DVT/VTE. Results should be correlated with clinical presentation. Performed at [facility], [address] [place], [place] [address]    Dg Chest 2 View  Result Date: 09/12/2018 CLINICAL DATA:  57 year old female with shortness of breath for the past 3 months worsening today. History of congestive heart failure. EXAM: CHEST - 2 VIEW COMPARISON:  Chest x-ray 08/02/2018. FINDINGS: Airspace consolidation in the right lower lobe. Small to moderate right parapneumonic pleural effusion. Left lung is clear. Trace left pleural effusion. No evidence of pulmonary edema. Heart size is normal. IMPRESSION: 1. Right lower lobe pneumonia with small to moderate parapneumonic pleural effusion. Followup PA and lateral chest X-ray is recommended in 3-4 weeks following trial of antibiotic therapy to ensure resolution and exclude underlying malignancy. 2. Trace  left pleural effusion. Electronically Signed   By: Vinnie Langton M.D.   On: 09/12/2018 17:28    Pending Labs [employer] (From admission, onward)    Start  Ordered   09/12/18 1751  Blood culture (routine x 2)  BLOOD CULTURE X 2,   STAT     09/12/18 1751   Signed and Held  HIV antibody (Routine Testing)  Once,   R     Signed and Held   Signed and Held  Basic metabolic panel  Tomorrow morning,   R     Signed and Held   Signed and Held  CBC  Tomorrow morning,   R     Signed and Held          Vitals/Pain Today's Vitals   09/12/18 1730 09/12/18 1742 09/12/18 1742 09/12/18 1940  BP: (!) 154/81  (!) 178/89 (!) 158/96  Pulse: (!) 114  90 (!) 106  Resp: (!) 23  18 20   Temp:   98.1 F (36.7 C)   TempSrc:   Oral   SpO2: 94%  96% 96%  Weight:      Height:      PainSc:  0-No pain      Isolation Precautions No active isolations  Medications Medications  iopamidol (ISOVUE-370) 76 % injection 100 mL (has no administration in time range)  albuterol (PROVENTIL) (2.5 MG/3ML) 0.083% nebulizer solution 5 mg (5 mg Nebulization Given 09/12/18 1601)  levofloxacin (LEVAQUIN) IVPB 500 mg (0 mg Intravenous Stopped 09/12/18 1935)    Mobility walks

## 2018-09-12 NOTE — ED Notes (Signed)
Blood cultures x 2 drawn by Herbert Spires, RN.

## 2018-09-13 DIAGNOSIS — J9601 Acute respiratory failure with hypoxia: Secondary | ICD-10-CM

## 2018-09-13 DIAGNOSIS — I5042 Chronic combined systolic (congestive) and diastolic (congestive) heart failure: Secondary | ICD-10-CM

## 2018-09-13 DIAGNOSIS — J181 Lobar pneumonia, unspecified organism: Secondary | ICD-10-CM

## 2018-09-13 LAB — BASIC METABOLIC PANEL
ANION GAP: 4 — AB (ref 5–15)
BUN: 18 mg/dL (ref 6–20)
CHLORIDE: 105 mmol/L (ref 98–111)
CO2: 29 mmol/L (ref 22–32)
Calcium: 8.4 mg/dL — ABNORMAL LOW (ref 8.9–10.3)
Creatinine, Ser: 0.94 mg/dL (ref 0.44–1.00)
GFR calc non Af Amer: >60 mL/min (ref >60)
Glucose, Bld: 98 mg/dL (ref 70–99)
POTASSIUM: 4.4 mmol/L (ref 3.5–5.1)
SODIUM: 138 mmol/L (ref 135–145)

## 2018-09-13 LAB — CBC
HEMATOCRIT: 38.2 % (ref 36.0–46.0)
HEMOGLOBIN: 11.2 g/dL — AB (ref 12.0–15.0)
MCH: 27 pg (ref 26.0–34.0)
MCHC: 29.3 g/dL — ABNORMAL LOW (ref 30.0–36.0)
MCV: 92 fL (ref 80.0–100.0)
Platelets: 352 10*3/uL (ref 150–400)
RBC: 4.15 MIL/uL (ref 3.87–5.11)
RDW: 15.9 % — AB (ref 11.5–15.5)
WBC: 4.8 10*3/uL (ref 4.0–10.5)
nRBC: 0 % (ref 0.0–0.2)

## 2018-09-13 LAB — TROPONIN I: Troponin I: <0.03 ng/mL (ref <0.03)

## 2018-09-13 MED ORDER — ACETAMINOPHEN 325 MG PO TABS: 650.0000 mg | ORAL_TABLET | Freq: Four times a day (QID) | ORAL | Status: DC | PRN

## 2018-09-13 NOTE — Progress Notes (Signed)
Pt's home medications (carvedilol 25 mg tabs and losartan 25 mg tabs) are in pt's drawer in the medication room.

## 2018-09-13 NOTE — Progress Notes (Signed)
PROGRESS NOTE  Michelle David YTK:[id number] DOB: Nov 29, 1960 DOA: 09/12/2018 PCP: Fayrene Helper, MD  Brief History:  57 year old female with a history of systolic and diastolic CHF, hyperlipidemia, hypertension presenting with 2 to 51-month history of worsening shortness of breath, coughing, and yellow sputum production.  The patient states that her breathing has gradually worsened to the point where she is unable to perform her activities of daily living such as bathing and putting on her clothes without shortness of breath.  She denies any fevers, chills, nausea, vomiting, diarrhea, abdominal pain.  She has some chest discomfort particular with coughing.  She denies any hemoptysis.  Upon presentation, the patient was afebrile hemodynamically stable with oxygen saturation 88% on room air.  Chest x-ray showed right lower lobe consolidation with moderate pleural effusion on the right.  CT angiogram chest was negative for central pulmonary embolus but showed right perihilar consolidation with air bronchograms and patchy left lower lobe consolidation.  There is a moderate to large right pleural effusion.  The patient was started on ceftriaxone and doxycycline.  Assessment/Plan: Acute respiratory failure with hypoxia -Secondary to pneumonia -Presently stable on 2 L -Wean oxygen for saturation greater 92% -Pulmonary hygiene  Lobar pneumonia -Continue ceftriaxone and doxycycline -Urine Legionella antigen -Urine Streptococcus pneumoniae antigen -follow blood cultures  Right pleural effusion -Discussed the risk, benefits, and alternatives of thoracocentesis -Patient expressed understanding, but does not want thoracocentesis at this time  Systolic and diastolic CHF -Clinically euvolemic -Continue carvedilol -Daily weights -08/18/2018 echo EF 45-50%, grade 1 DD, mild MR  Essential hypertension -Continue carvedilol  Hyperlipidemia -Continue statin  Elevated  troponin -Minimally elevated secondary to demand ischemia -Personally reviewed EKG--sinus rhythm, no ST-T wave changes -Personally reviewed chest x-ray--right lower lobe consolidation with pleural effusion  Morbid obesity -BMI 40.32 -Lifestyle modification    Disposition Plan:   Home in 2-3 days  Family Communication:  no Family at bedside  Consultants:  none  Code Status:  FULL   DVT Prophylaxis: [place] Lovenox   Procedures: As Listed in Progress Note Above  Antibiotics: Ceftriaxone 11/24>>> Doxy 11/24>>>       Subjective: Patient states that she is breathing was slightly better than yesterday.  She continues to have a cough with yellow sputum.  She denies any nausea, vomiting, diarrhea, abdominal pain, dysuria, hematuria.  She has some chest discomfort with coughing.  Objective: Vitals:   09/12/18 2114 09/12/18 2258 09/13/18 0500 09/13/18 0609  BP: (!) 149/90   112/64  Pulse: (!) 124   60  Resp: 18   19  Temp:  98.2 F (36.8 C)  (!) 97.5 F (36.4 C)  TempSrc:    Oral  SpO2: 97%   94%  Weight: 100.4 kg  100 kg   Height: 5\' 2"  (1.575 m)       Intake/Output Summary (Last 24 hours) at 09/13/2018 0753 Last data filed at 09/12/2018 1935 Gross per 24 hour  Intake 169.33 ml  Output -  Net 169.33 ml   Weight change:  Exam:   General:  Pt is alert, follows commands appropriately, not in acute distress  HEENT: No icterus, No thrush, No neck mass, [place]/AT  Cardiovascular: RRR, S1/S2, no rubs, no gallops  Respiratory: Bibasilar rales.  Diminished breath sounds on the right.  No wheezing.  Abdomen: Soft/+BS, non tender, non distended, no guarding  Extremities: No edema, No lymphangitis, No petechiae, No rashes, no synovitis   Data Reviewed: I  have personally reviewed following labs and imaging studies Basic Metabolic Panel: Recent Labs  Lab 09/12/18 1547 09/13/18 0404  NA 137 138  K 3.8 4.4  CL 102 105  CO2 28 29  GLUCOSE 91 98  BUN 15 18   CREATININE 0.85 0.94  CALCIUM 8.7* 8.4*   Liver Function Tests: Recent Labs  Lab 09/12/18 1547  AST 38  ALT 55*  ALKPHOS 84  BILITOT 0.6  PROT 9.1*  ALBUMIN 3.1*   No results for input(s): LIPASE, AMYLASE in the last 168 hours. No results for input(s): AMMONIA in the last 168 hours. Coagulation Profile: No results for input(s): INR, PROTIME in the last 168 hours. CBC: Recent Labs  Lab 09/12/18 1547 09/13/18 0404  WBC 5.3 4.8  NEUTROABS 3.8  --   HGB 12.1 11.2*  HCT 40.5 38.2  MCV 90.0 92.0  PLT 402* 352   Cardiac Enzymes: Recent Labs  Lab 09/12/18 1547 09/12/18 2310 09/13/18 0404  TROPONINI 0.03* <0.03 <0.03   BNP: Invalid input(s): POCBNP CBG: No results for input(s): GLUCAP in the last 168 hours. HbA1C: No results for input(s): HGBA1C in the last 72 hours. Urine analysis:    Component Value Date/Time   COLORURINE YELLOW 08/05/2016 [place] 08/05/2016 1414   LABSPEC 1.020 08/05/2016 1414   PHURINE 6.0 08/05/2016 1414   GLUCOSEU NEGATIVE 08/05/2016 1414   GLUCOSEU NEG mg/dL 12/11/2007 0434   HGBUR TRACE (A) 08/05/2016 1414   HGBUR trace-intact 03/04/2010 0000   BILIRUBINUR NEGATIVE 08/05/2016 1414   BILIRUBINUR neg 04/10/2016 1529   KETONESUR NEGATIVE 08/05/2016 1414   PROTEINUR NEGATIVE 08/05/2016 1414   UROBILINOGEN 1.0 04/10/2016 1529   UROBILINOGEN 1.0 03/04/2010 0000   NITRITE NEGATIVE 08/05/2016 1414   LEUKOCYTESUR TRACE (A) 08/05/2016 1414   Sepsis Labs: @LABRCNTIP (procalcitonin:4,lacticidven:4) ) Recent Results (from the past 240 hour(s))  Blood culture (routine x 2)     Status: None (Preliminary result)   Collection Time: 09/12/18  7:15 PM  Result Value Ref Range Status   Specimen Description LEFT ANTECUBITAL  Final   Special Requests   Final    BOTTLES DRAWN AEROBIC AND ANAEROBIC Blood Culture adequate volume   Culture   Final    NO GROWTH < 12 HOURS Performed at [facility], [address], [place], [place]  [address]    Report Status PENDING  Incomplete  Blood culture (routine x 2)     Status: None (Preliminary result)   Collection Time: 09/12/18  7:19 PM  Result Value Ref Range Status   Specimen Description BLOOD RIGHT ARM  Final   Special Requests   Final    BOTTLES DRAWN AEROBIC ONLY Blood Culture adequate volume   Culture   Final    NO GROWTH < 12 HOURS Performed at [facility], [address], [place], [place] [address]    Report Status PENDING  Incomplete     Scheduled Meds: . atorvastatin  40 mg Oral Daily  . carvedilol  25 mg Oral BID  . dextromethorphan-guaiFENesin  1 tablet Oral BID  . enoxaparin (LOVENOX) injection  40 mg Subcutaneous Q24H   Continuous Infusions: . cefTRIAXone (ROCEPHIN)  IV    . doxycycline (VIBRAMYCIN) IV      Procedures/Studies: Dg Chest 2 View  Result Date: 09/12/2018 CLINICAL DATA:  57 year old female with shortness of breath for the past 3 months worsening today. History of congestive heart failure. EXAM: CHEST - 2 VIEW COMPARISON:  Chest x-ray 08/02/2018. FINDINGS: Airspace consolidation in  the right lower lobe. Small to moderate right parapneumonic pleural effusion. Left lung is clear. Trace left pleural effusion. No evidence of pulmonary edema. Heart size is normal. IMPRESSION: 1. Right lower lobe pneumonia with small to moderate parapneumonic pleural effusion. Followup PA and lateral chest X-ray is recommended in 3-4 weeks following trial of antibiotic therapy to ensure resolution and exclude underlying malignancy. 2. Trace left pleural effusion. Electronically Signed   By: Vinnie Langton M.D.   On: 09/12/2018 17:28   Ct Angio Chest Pe W And/or Wo Contrast  Result Date: 09/12/2018 CLINICAL DATA:  Progressively worsening shortness of breath for 3 months. Productive cough. Follow-up RIGHT pneumonia. EXAM: CT ANGIOGRAPHY CHEST WITH CONTRAST TECHNIQUE: Multidetector CT imaging of the chest was performed using the standard protocol during bolus  administration of intravenous contrast. Multiplanar CT image reconstructions and MIPs were obtained to evaluate the vascular anatomy. CONTRAST:  185mL ISOVUE-370 IOPAMIDOL (ISOVUE-370) INJECTION 76% COMPARISON:  None. FINDINGS: Moderate to severely respiratory motion degraded examination, per technologist note patient was coughing and talking during examination. CARDIOVASCULAR: Inadequate contrast opacification of the pulmonary artery's, delayed phase. Main pulmonary artery is not enlarged. Central pulmonary arterial filling defect. Heart size is mildly enlarged. No pericardial effusion. Thoracic aorta is normal course and caliber, mild calcific atherosclerosis. MEDIASTINUM/NODES: Mediastinal lymphadenopathy, pre tear occult 21 mm lymph node. Pre-vascular 12 mm lymph node and additional enlarged lymph nodes. LUNGS/PLEURA: No pneumothorax. Limited assessment tracheobronchial tree due to motion. Moderate to large RIGHT pleural effusion. RIGHT perihilar consolidation with air bronchograms, patchy LEFT lower lobe consolidation. RIGHT lower lobe atelectasis versus consolidation. UPPER ABDOMEN: Non-acute. MUSCULOSKELETAL: Non-acute the limited by motion. Review of the MIP images confirms the above findings. IMPRESSION: 1. Moderate to severely motion degraded examination, delayed phase. No central pulmonary embolus, limited assessment. 2. Multifocal pneumonia and moderate to large RIGHT pleural effusion. Given degree of motion, recommend follow-up CT in 4-6 weeks after treatment to exclude mass. 3. Mediastinal lymphadenopathy, likely reactive though recommend close attention on follow-up imaging. 4. Mild cardiomegaly. Aortic Atherosclerosis (ICD10-I70.0). Electronically Signed   By: Elon Alas M.D.   On: 09/12/2018 21:26    Orson Eva, DO  [facility] Pager [phone]  If 7PM-7AM, please contact night-coverage [email or web address] Password [email or web address] 09/13/2018, 7:53 AM   LOS: 1 day

## 2018-09-14 LAB — HIV ANTIBODY (ROUTINE TESTING W REFLEX): HIV SCREEN 4TH GENERATION: NONREACTIVE

## 2018-09-14 LAB — STREP PNEUMONIAE URINARY ANTIGEN: Strep Pneumo Urinary Antigen: NEGATIVE

## 2018-09-14 NOTE — Progress Notes (Signed)
PROGRESS NOTE  Michelle David DVV:[id number] DOB: June 13, 1961 DOA: 09/12/2018 PCP: Fayrene Helper, MD  Brief History:  57 year old female with a history of systolic and diastolic CHF, hyperlipidemia, hypertension presenting with 2 to 71-month history of worsening shortness of breath, coughing, and yellow sputum production.  The patient states that her breathing has gradually worsened to the point where she is unable to perform her activities of daily living such as bathing and putting on her clothes without shortness of breath.  She denies any fevers, chills, nausea, vomiting, diarrhea, abdominal pain.  She has some chest discomfort particular with coughing.  She denies any hemoptysis.  Upon presentation, the patient was afebrile hemodynamically stable with oxygen saturation 88% on room air.  Chest x-ray showed right lower lobe consolidation with moderate pleural effusion on the right.  CT angiogram chest was negative for central pulmonary embolus but showed right perihilar consolidation with air bronchograms and patchy left lower lobe consolidation.  There is a moderate to large right pleural effusion.  The patient was started on ceftriaxone and doxycycline.  Assessment/Plan: Acute respiratory failure with hypoxia -Secondary to pneumonia -Presently stable on 2 L -Wean oxygen for saturation greater 92% -Pulmonary hygiene -pt not wearing oxygen as directed--counseled on importance  Lobar pneumonia -Continue ceftriaxone and doxycycline -Urine Legionella antigen -Urine Streptococcus pneumoniae antigen--neg -follow blood cultures  Right pleural effusion -Discussed again the risk, benefits, and alternatives of thoracocentesis -Patient expressed understanding, but does not want thoracocentesis at this time  Chronic Systolic and diastolic CHF -Clinically euvolemic -Continue carvedilol -Daily weights -08/18/2018 echo EF 45-50%, grade 1 DD, mild MR  Essential  hypertension -Continue carvedilol  Hyperlipidemia -Continue statin  Elevated troponin -Minimally elevated secondary to demand ischemia -Personally reviewed EKG--sinus rhythm, no ST-T wave changes -Personally reviewed chest x-ray--right lower lobe consolidation with pleural effusion  Morbid obesity -BMI 40.32 -Lifestyle modification    Disposition Plan:   Home in 1-2 days  Family Communication:  daughter updated at bedside 11/26  Consultants:  none  Code Status:  FULL   DVT Prophylaxis: [place] Lovenox   Procedures: As Listed in Progress Note Above  Antibiotics: Ceftriaxone 11/24>>> Doxy 11/24>>>  Subjective: Pt breathing a little better but still has dyspnea with exertion.  Denies cp, n/v/d, abd pain. No f/c  Objective: Vitals:   09/14/18 0517 09/14/18 0656 09/14/18 0848 09/14/18 1454  BP: 124/69   (!) 119/49  Pulse: (!) 52   64  Resp: 19   19  Temp: 97.6 F (36.4 C)   97.9 F (36.6 C)  TempSrc: Oral   Oral  SpO2: 95%  92% 94%  Weight:  100 kg    Height:        Intake/Output Summary (Last 24 hours) at 09/14/2018 1659 Last data filed at 09/14/2018 1425 Gross per 24 hour  Intake 1556.92 ml  Output -  Net 1556.92 ml   Weight change: -1.152 kg Exam:   General:  Pt is alert, follows commands appropriately, not in acute distress  HEENT: No icterus, No thrush, No neck mass, [place]/AT  Cardiovascular: RRR, S1/S2, no rubs, no gallops  Respiratory: bibasilar rales R>L.  Diminished breath sounds on R.  Abdomen: Soft/+BS, non tender, non distended, no guarding  Extremities: No edema, No lymphangitis, No petechiae, No rashes, no synovitis   Data Reviewed: I have personally reviewed following labs and imaging studies Basic Metabolic Panel: Recent Labs  Lab 09/12/18 1547 09/13/18 0404  NA 137  138  K 3.8 4.4  CL 102 105  CO2 28 29  GLUCOSE 91 98  BUN 15 18  CREATININE 0.85 0.94  CALCIUM 8.7* 8.4*   Liver Function Tests: Recent Labs  Lab  09/12/18 1547  AST 38  ALT 55*  ALKPHOS 84  BILITOT 0.6  PROT 9.1*  ALBUMIN 3.1*   No results for input(s): LIPASE, AMYLASE in the last 168 hours. No results for input(s): AMMONIA in the last 168 hours. Coagulation Profile: No results for input(s): INR, PROTIME in the last 168 hours. CBC: Recent Labs  Lab 09/12/18 1547 09/13/18 0404  WBC 5.3 4.8  NEUTROABS 3.8  --   HGB 12.1 11.2*  HCT 40.5 38.2  MCV 90.0 92.0  PLT 402* 352   Cardiac Enzymes: Recent Labs  Lab 09/12/18 1547 09/12/18 2310 09/13/18 0404  TROPONINI 0.03* <0.03 <0.03   BNP: Invalid input(s): POCBNP CBG: No results for input(s): GLUCAP in the last 168 hours. HbA1C: No results for input(s): HGBA1C in the last 72 hours. Urine analysis:    Component Value Date/Time   COLORURINE YELLOW 08/05/2016 [place] 08/05/2016 1414   LABSPEC 1.020 08/05/2016 1414   PHURINE 6.0 08/05/2016 1414   GLUCOSEU NEGATIVE 08/05/2016 1414   GLUCOSEU NEG mg/dL 12/11/2007 0434   HGBUR TRACE (A) 08/05/2016 1414   HGBUR trace-intact 03/04/2010 0000   BILIRUBINUR NEGATIVE 08/05/2016 1414   BILIRUBINUR neg 04/10/2016 1529   KETONESUR NEGATIVE 08/05/2016 1414   PROTEINUR NEGATIVE 08/05/2016 1414   UROBILINOGEN 1.0 04/10/2016 1529   UROBILINOGEN 1.0 03/04/2010 0000   NITRITE NEGATIVE 08/05/2016 1414   LEUKOCYTESUR TRACE (A) 08/05/2016 1414   Sepsis Labs: @LABRCNTIP (procalcitonin:4,lacticidven:4) ) Recent Results (from the past 240 hour(s))  Blood culture (routine x 2)     Status: None (Preliminary result)   Collection Time: 09/12/18  7:15 PM  Result Value Ref Range Status   Specimen Description LEFT ANTECUBITAL  Final   Special Requests   Final    BOTTLES DRAWN AEROBIC AND ANAEROBIC Blood Culture adequate volume   Culture   Final    NO GROWTH 1 DAY Performed at [facility], [address], [place], [place] [address]    Report Status PENDING  Incomplete  Blood culture (routine x 2)     Status: None  (Preliminary result)   Collection Time: 09/12/18  7:19 PM  Result Value Ref Range Status   Specimen Description BLOOD RIGHT ARM  Final   Special Requests   Final    BOTTLES DRAWN AEROBIC ONLY Blood Culture adequate volume   Culture   Final    NO GROWTH 1 DAY Performed at [facility], [address], [place], [place] [address]    Report Status PENDING  Incomplete     Scheduled Meds: . atorvastatin  40 mg Oral Daily  . carvedilol  25 mg Oral BID  . dextromethorphan-guaiFENesin  1 tablet Oral BID  . enoxaparin (LOVENOX) injection  40 mg Subcutaneous Q24H   Continuous Infusions: . cefTRIAXone (ROCEPHIN)  IV Stopped (09/14/18 1007)  . doxycycline (VIBRAMYCIN) IV 125 mL/hr at 09/14/18 1027    Procedures/Studies: Dg Chest 2 View  Result Date: 09/12/2018 CLINICAL DATA:  57 year old female with shortness of breath for the past 3 months worsening today. History of congestive heart failure. EXAM: CHEST - 2 VIEW COMPARISON:  Chest x-ray 08/02/2018. FINDINGS: Airspace consolidation in the right lower lobe. Small to moderate right parapneumonic pleural effusion. Left lung is clear. Trace left pleural effusion. No  evidence of pulmonary edema. Heart size is normal. IMPRESSION: 1. Right lower lobe pneumonia with small to moderate parapneumonic pleural effusion. Followup PA and lateral chest X-ray is recommended in 3-4 weeks following trial of antibiotic therapy to ensure resolution and exclude underlying malignancy. 2. Trace left pleural effusion. Electronically Signed   By: Vinnie Langton M.D.   On: 09/12/2018 17:28   Ct Angio Chest Pe W And/or Wo Contrast  Result Date: 09/12/2018 CLINICAL DATA:  Progressively worsening shortness of breath for 3 months. Productive cough. Follow-up RIGHT pneumonia. EXAM: CT ANGIOGRAPHY CHEST WITH CONTRAST TECHNIQUE: Multidetector CT imaging of the chest was performed using the standard protocol during bolus administration of intravenous contrast. Multiplanar CT  image reconstructions and MIPs were obtained to evaluate the vascular anatomy. CONTRAST:  144mL ISOVUE-370 IOPAMIDOL (ISOVUE-370) INJECTION 76% COMPARISON:  None. FINDINGS: Moderate to severely respiratory motion degraded examination, per technologist note patient was coughing and talking during examination. CARDIOVASCULAR: Inadequate contrast opacification of the pulmonary artery's, delayed phase. Main pulmonary artery is not enlarged. Central pulmonary arterial filling defect. Heart size is mildly enlarged. No pericardial effusion. Thoracic aorta is normal course and caliber, mild calcific atherosclerosis. MEDIASTINUM/NODES: Mediastinal lymphadenopathy, pre tear occult 21 mm lymph node. Pre-vascular 12 mm lymph node and additional enlarged lymph nodes. LUNGS/PLEURA: No pneumothorax. Limited assessment tracheobronchial tree due to motion. Moderate to large RIGHT pleural effusion. RIGHT perihilar consolidation with air bronchograms, patchy LEFT lower lobe consolidation. RIGHT lower lobe atelectasis versus consolidation. UPPER ABDOMEN: Non-acute. MUSCULOSKELETAL: Non-acute the limited by motion. Review of the MIP images confirms the above findings. IMPRESSION: 1. Moderate to severely motion degraded examination, delayed phase. No central pulmonary embolus, limited assessment. 2. Multifocal pneumonia and moderate to large RIGHT pleural effusion. Given degree of motion, recommend follow-up CT in 4-6 weeks after treatment to exclude mass. 3. Mediastinal lymphadenopathy, likely reactive though recommend close attention on follow-up imaging. 4. Mild cardiomegaly. Aortic Atherosclerosis (ICD10-I70.0). Electronically Signed   By: Elon Alas M.D.   On: 09/12/2018 21:26    Orson Eva, DO  [facility] Pager [phone]  If 7PM-7AM, please contact night-coverage [email or web address] Password [email or web address] 09/14/2018, 4:59 PM   LOS: 2 days

## 2018-09-15 LAB — BASIC METABOLIC PANEL
ANION GAP: 6 (ref 5–15)
BUN: 17 mg/dL (ref 6–20)
CALCIUM: 8.6 mg/dL — AB (ref 8.9–10.3)
CHLORIDE: 107 mmol/L (ref 98–111)
CO2: 26 mmol/L (ref 22–32)
Creatinine, Ser: 0.79 mg/dL (ref 0.44–1.00)
GFR calc non Af Amer: >60 mL/min (ref >60)
GLUCOSE: 80 mg/dL (ref 70–99)
POTASSIUM: 3.9 mmol/L (ref 3.5–5.1)
Sodium: 139 mmol/L (ref 135–145)

## 2018-09-15 LAB — CBC
HCT: 38.5 % (ref 36.0–46.0)
HEMOGLOBIN: 11.4 g/dL — AB (ref 12.0–15.0)
MCH: 27.5 pg (ref 26.0–34.0)
MCHC: 29.6 g/dL — ABNORMAL LOW (ref 30.0–36.0)
MCV: 93 fL (ref 80.0–100.0)
PLATELETS: 354 10*3/uL (ref 150–400)
RBC: 4.14 MIL/uL (ref 3.87–5.11)
RDW: 15.6 % — ABNORMAL HIGH (ref 11.5–15.5)
WBC: 4.2 10*3/uL (ref 4.0–10.5)
nRBC: 0 % (ref 0.0–0.2)

## 2018-09-15 MED ORDER — GUAIFENESIN ER 600 MG PO TB12: 600.0000 mg | ORAL_TABLET | Freq: Two times a day (BID) | ORAL | 0 refills | Status: DC

## 2018-09-15 MED ORDER — AMOXICILLIN-POT CLAVULANATE 875-125 MG PO TABS: 1.0000 | ORAL_TABLET | Freq: Two times a day (BID) | ORAL | 0 refills | Status: AC

## 2018-09-15 MED ORDER — CARVEDILOL 25 MG PO TABS: 12.5000 mg | ORAL_TABLET | Freq: Two times a day (BID) | ORAL | Status: DC

## 2018-09-15 MED ORDER — GUAIFENESIN-DM 100-10 MG/5ML PO SYRP
5.0000 mL | ORAL_SOLUTION | ORAL | Status: DC | PRN
  Administered 2018-09-15: 5 mL via ORAL
  Filled 2018-09-15: qty 5

## 2018-09-15 MED ORDER — FUROSEMIDE 40 MG PO TABS: ORAL_TABLET | ORAL | 3 refills | Status: DC

## 2018-09-15 NOTE — Progress Notes (Signed)
IV discontinued,catheter intact. Discharge instructions given on medications and follow up visits,patient verbalized understanding. Prescriptions  Sent to Pharmacy of choice documented on AVS. Accompanied by staff to an awaiting vehicle.

## 2018-09-15 NOTE — Progress Notes (Signed)
SATURATION QUALIFICATIONS: (This note is used to comply with regulatory documentation for home oxygen)  Patient Saturations on Room Air at Rest = 90%  Patient Saturations on Room Air while Ambulating = 85%  Patient Saturations on 2 Liters of oxygen while Ambulating = 92%  Please briefly explain why patient needs home oxygen: 

## 2018-09-15 NOTE — Discharge Summary (Signed)
Physician Discharge Summary  SANAZ SCARLETT ZOX:[id number] DOB: Nov 15, 1960 DOA: 09/12/2018  PCP: Fayrene Helper, MD  Admit date: 09/12/2018 Discharge date: 09/15/2018  Time spent: 35 minutes  Recommendations for Outpatient Follow-up:  1. Repeat basic metabolic panel to follow electrolytes and renal function 2. Reassess need of oxygen supplementation and repeat chest x-ray/chest CT to further determine the need of thoracentesis after completing antibiotic therapy.  (This is studies can be safely follow in the next 4-6 weeks; unless patient's condition deteriorates or symptoms worsen.).   Discharge Diagnoses:  Active Problems:   Chronic combined systolic and diastolic CHF (congestive heart failure) ([place])   Community acquired pneumonia   Acute respiratory failure with hypoxia (HCC)   Lobar pneumonia (HCC)   Obesity, Class III, BMI 40-49.9 (morbid obesity) ([place])   Discharge Condition: Stable and improved.  Patient discharged home with instructions to follow-up with PCP in 10 days.  Oxygen supplementation has been arranged at time of discharge.  Diet recommendation: Heart healthy diet and low-calorie diet.  Filed Weights   09/12/18 2114 09/13/18 0500 09/14/18 0656  Weight: 100.4 kg 100 kg 100 kg    History of present illness:  As per H&P written by Dr. Denton Brick on 09/12/2018 57 y.o. female with medical history significant for diastolic and systolic CHF, HTN, who presented to the ED complaints of shortness of breath and cough productive of yellowish sputum of 2 to 3 months.  She denies chest pain.  No lower extremity swelling redness or pain.  No recent trips.. Denies fevers or chills.   ED Course: tachycardia - 115, intermittent tachypnea to 32, O2 sats initially 88% on room air dropped to 84% with ambulation, with 2 L O2.  Two-view chest x-ray right lower lobe pneumonia small-to-moderate parapneumonic effusion.  WBC normal 5.3.  Elevated d-dimer 3.14.  Normal BNP 61.  Cultures  obtained just after antibiotics given in ED.  Considering elevated d-dimer CTA chest was ordered (which was negative for pulmonary embolism).  Hospital Course:  1-acute respiratory failure with hypoxia -Appears to be secondary to right lower lobe pneumonia and pleural effusion. -Patient requiring oxygen supplementation to maintain O2 sat above 90% -She is afebrile, normal WBCs with a respiratory rate in the 18-20/min. -Patient antibiotic has been transitioned to Augmentin to complete therapy -She has been discharged on oxygen supplementation to further slowly wean herself back to room air. -Patient with right pleural effusion; but she declined thoracentesis during this admission.  There was no signs of loculation and at discharge no signs of systemic infection. -She will complete antibiotic as mentioned above I will have repeat images during follow-up with her PCP to further assess the need for thoracentesis down the road. -Patient instructed to follow low-sodium diet I will to 3 consecutive days of 20 mg of Lasix to try to assist with diuresis/fluid shifting.  2-essential hypertension -Continue carvedilol and low-dose Cozaar -She is advised to follow low-sodium diet  3-chronic systolic and diastolic heart failure -Overall compensated -Continue beta-blocker and ARB -Patient instructed to follow daily weights and low-sodium diet -Continue as needed Lasix as previously instructed by her cardiologist. -Outpatient follow-up with Dr. Domenic Polite as previously instructed.  4-morbid obesity -Body mass index is 40.32 kg/m. -Low calorie diet, portion control and increase physical activity has been discussed with patient. -She may have a component of obesity hypoventilation syndrome and will benefit of a sleep study as an outpatient.  5-HLD -continue statins    Procedures:  See below for x-ray reports.  Consultations:  None  Discharge Exam: Vitals:   09/15/18 0438 09/15/18 1508  BP:  117/70 136/66  Pulse: (!) 57 60  Resp: 20 18  Temp: 97.8 F (36.6 C) 97.8 F (36.6 C)  SpO2: 95% 99%    General: afebrile, patient denies chest pain reports some improvement in her breathing.  Still requiring oxygen supplementation to maintain O2 sat above 90% especially with exertion.  No nausea, no vomiting.  Tolerating diet. Cardiovascular: S1 and S2, no rubs, no gallops, no murmurs, no JVD appreciated. Respiratory: Positive rhonchi, no wheezing, no frank crackles.  Patient is no using accessory muscles. Abdomen: Obese, soft, nontender, nondistended, positive bowel sounds Extremities: Trace edema bilaterally, no cyanosis, no clubbing.  Discharge Instructions   Discharge Instructions    (HEART FAILURE PATIENTS) Call MD:  Anytime you have any of the following symptoms: 1) 3 pound weight gain in 24 hours or 5 pounds in 1 week 2) shortness of breath, with or without a dry hacking cough 3) swelling in the hands, feet or stomach 4) if you have to sleep on extra pillows at night in order to breathe.   Complete by:  As directed    Diet - low sodium heart healthy   Complete by:  As directed    Discharge instructions   Complete by:  As directed    Take medications as prescribed Continue the use of oxygen supplementation and follow-up with PCP in 10 days Keep yourself well-hydrated Follow low-sodium diet Check your weight on daily basis Remember to take Lasix 20 mg by mouth daily for the next 3 days and then resume as needed usage as previously instructed.     Allergies as of 09/15/2018   No Known Allergies     Medication List    TAKE these medications   amoxicillin-clavulanate 875-125 MG tablet Commonly known as:  AUGMENTIN Take 1 tablet by mouth 2 (two) times daily for 7 days.   atorvastatin 40 MG tablet Commonly known as:  LIPITOR Take 1 tablet (40 mg total) by mouth daily.   carvedilol 25 MG tablet Commonly known as:  COREG Take 0.5 tablets (12.5 mg total) by mouth 2  (two) times daily. What changed:  how much to take   furosemide 40 MG tablet Commonly known as:  LASIX Take 40 mg daily if you gain more than 2- 3 lbs in 24 hours. For the next 3 days take 20mg  daily What changed:  additional instructions   guaiFENesin 600 MG 12 hr tablet Commonly known as:  MUCINEX Take 1 tablet (600 mg total) by mouth 2 (two) times daily.   losartan 25 MG tablet Commonly known as:  COZAAR Take 0.5 tablets (12.5 mg total) by mouth daily.            Durable Medical Equipment  (From admission, onward)         Start     Ordered   09/15/18 1047  For home use only DME oxygen  Once    Comments:  Deliver portable oxygen concentrator  Question Answer Comment  Mode or (Route) Nasal cannula   Liters per Minute 2   Frequency Continuous (stationary and portable oxygen unit needed)   Oxygen conserving device Yes   Oxygen delivery system Gas      09/15/18 1047         No Known Allergies Follow-up Information    Fayrene Helper, MD. Schedule an appointment as soon as possible for a visit in 10 day(s).   Specialty:  Family Medicine Contact information: [address], [address] [place] [address] [phone]        Satira Sark, MD .   Specialty:  Cardiology Contact information: [place] [address] [phone]           The results of significant diagnostics from this hospitalization (including imaging, microbiology, ancillary and laboratory) are listed below for reference.    Significant Diagnostic Studies: Dg Chest 2 View  Result Date: 09/12/2018 CLINICAL DATA:  57 year old female with shortness of breath for the past 3 months worsening today. History of congestive heart failure. EXAM: CHEST - 2 VIEW COMPARISON:  Chest x-ray 08/02/2018. FINDINGS: Airspace consolidation in the right lower lobe. Small to moderate right parapneumonic pleural effusion. Left lung is clear. Trace left pleural effusion. No evidence  of pulmonary edema. Heart size is normal. IMPRESSION: 1. Right lower lobe pneumonia with small to moderate parapneumonic pleural effusion. Followup PA and lateral chest X-ray is recommended in 3-4 weeks following trial of antibiotic therapy to ensure resolution and exclude underlying malignancy. 2. Trace left pleural effusion. Electronically Signed   By: Vinnie Langton M.D.   On: 09/12/2018 17:28   Ct Angio Chest Pe W And/or Wo Contrast  Result Date: 09/12/2018 CLINICAL DATA:  Progressively worsening shortness of breath for 3 months. Productive cough. Follow-up RIGHT pneumonia. EXAM: CT ANGIOGRAPHY CHEST WITH CONTRAST TECHNIQUE: Multidetector CT imaging of the chest was performed using the standard protocol during bolus administration of intravenous contrast. Multiplanar CT image reconstructions and MIPs were obtained to evaluate the vascular anatomy. CONTRAST:  134mL ISOVUE-370 IOPAMIDOL (ISOVUE-370) INJECTION 76% COMPARISON:  None. FINDINGS: Moderate to severely respiratory motion degraded examination, per technologist note patient was coughing and talking during examination. CARDIOVASCULAR: Inadequate contrast opacification of the pulmonary artery's, delayed phase. Main pulmonary artery is not enlarged. Central pulmonary arterial filling defect. Heart size is mildly enlarged. No pericardial effusion. Thoracic aorta is normal course and caliber, mild calcific atherosclerosis. MEDIASTINUM/NODES: Mediastinal lymphadenopathy, pre tear occult 21 mm lymph node. Pre-vascular 12 mm lymph node and additional enlarged lymph nodes. LUNGS/PLEURA: No pneumothorax. Limited assessment tracheobronchial tree due to motion. Moderate to large RIGHT pleural effusion. RIGHT perihilar consolidation with air bronchograms, patchy LEFT lower lobe consolidation. RIGHT lower lobe atelectasis versus consolidation. UPPER ABDOMEN: Non-acute. MUSCULOSKELETAL: Non-acute the limited by motion. Review of the MIP images confirms the above  findings. IMPRESSION: 1. Moderate to severely motion degraded examination, delayed phase. No central pulmonary embolus, limited assessment. 2. Multifocal pneumonia and moderate to large RIGHT pleural effusion. Given degree of motion, recommend follow-up CT in 4-6 weeks after treatment to exclude mass. 3. Mediastinal lymphadenopathy, likely reactive though recommend close attention on follow-up imaging. 4. Mild cardiomegaly. Aortic Atherosclerosis (ICD10-I70.0). Electronically Signed   By: Elon Alas M.D.   On: 09/12/2018 21:26   Microbiology: Recent Results (from the past 240 hour(s))  Blood culture (routine x 2)     Status: None (Preliminary result)   Collection Time: 09/12/18  7:15 PM  Result Value Ref Range Status   Specimen Description LEFT ANTECUBITAL  Final   Special Requests   Final    BOTTLES DRAWN AEROBIC AND ANAEROBIC Blood Culture adequate volume   Culture   Final    NO GROWTH 2 DAYS Performed at [facility], [address] [place], [place] [address]    Report Status PENDING  Incomplete  Blood culture (routine x 2)     Status: None (Preliminary result)   Collection Time: 09/12/18  7:19 PM  Result Value Ref Range Status   Specimen Description BLOOD RIGHT ARM  Final   Special Requests   Final    BOTTLES DRAWN AEROBIC ONLY Blood Culture adequate volume   Culture   Final    NO GROWTH 2 DAYS Performed at [facility], [address] [place], [place] [address]    Report Status PENDING  Incomplete     Labs: Basic Metabolic Panel: Recent Labs  Lab 09/12/18 1547 09/13/18 0404 09/15/18 0535  NA 137 138 139  K 3.8 4.4 3.9  CL 102 105 107  CO2 28 29 26   GLUCOSE 91 98 80  BUN 15 18 17   CREATININE 0.85 0.94 0.79  CALCIUM 8.7* 8.4* 8.6*   Liver Function Tests: Recent Labs  Lab 09/12/18 1547  AST 38  ALT 55*  ALKPHOS 84  BILITOT 0.6  PROT 9.1*  ALBUMIN 3.1*   CBC: Recent Labs  Lab 09/12/18 1547 09/13/18 0404 09/15/18 0535  WBC 5.3 4.8 4.2  NEUTROABS  3.8  --   --   HGB 12.1 11.2* 11.4*  HCT 40.5 38.2 38.5  MCV 90.0 92.0 93.0  PLT 402* 352 354   Cardiac Enzymes: Recent Labs  Lab 09/12/18 1547 09/12/18 2310 09/13/18 0404  TROPONINI 0.03* <0.03 <0.03   BNP: BNP (last 3 results) Recent Labs    08/02/18 1631 09/12/18 1547  BNP 74 61.0    Signed:  Barton Dubois MD.  [facility] 09/15/2018, 4:04 PM

## 2018-09-15 NOTE — Care Management (Signed)
Patient Information   Patient Name Michelle David, Michelle David ([id number]) Sex Female DOB June 06, 1961  Room Bed  A331 A331-01  Patient Demographics   Address [place] [address] Phone [phone] [facility]) [phone] (Work) [phone] (Mobile) E-mail Address [email or web address]  Patient Ethnicity & Race   Ethnic Group Patient Race  Not Hispanic or Latino Black or African American  Emergency Contact(s)   Name Relation Home Work [place] Spouse [phone]  [phone]  Corleen, [place]   [phone]  Documents on File    Status Date Received Description  Documents for the Patient  EMR Medication Summary Not Received    EMR Immunization Summary Not Received    EMR Problem Summary Not Received    EMR Patient Summary Not Received    Historic Radiology Documentation Not Received    Historic Radiology Documentation Not Received    [place] Received 04/22/11   [place] E-Signature HIPAA Notice of Privacy Received 07/12/12   [place] E-Signature HIPAA Notice of Privacy Spanish Not Received    Driver's License Received 31/54/00 EXP 09/16/2024  Insurance Card Received 04/22/11   Advance Directives/Living Will/HCPOA/POA Not Received    Insurance Card Not Received    [place] Received 86/76/19   Financial Application Not Received    Google Card Not Received    Insurance Card Not Received    [place] Not Received    Insurance Card Received 01/26/12   Insurance Card Not Received    Insurance Card Received 04/30/12 [facility]  Insurance Card Received 05/26/13   Insurance Card Not Received    Insurance Card Received 11/21/13   Insurance Card Not Received    Insurance Card Received 03/07/14   Insurance Card Received 06/12/14   AMB Correspondence  10/03/14 LETTER MISSED APPT. [place] Card Received 11/13/14  BCBS  AMB Correspondence  11/23/14 LETTER  PRIMARY CARE  Other Photo ID Not Received    Insurance Card Received 03/01/15   Insurance Card Received 07/04/15   Insurance Card Received 07/26/15   AMB Correspondence  07/26/15 SOAP NOTE TEOH MD, S  AMB Correspondence  10/04/15 SOAP NOTE TEOH MD, [place] Card   BCBS 11/2015  Insurance Card Received 06/08/17   AMB Correspondence  08/05/16 OFFICE VISIT [facility]  HIM ROI Authorization  02/12/17 PATIENT  [place] E-Signature HIPAA Notice of Privacy Signed 03/26/17   Insurance Card Received 10/22/17 BCBS  Release of Information Not Received    Advanced Beneficiary Notice (ABN) Not Received    E-Signature AOB Spanish Not Received    Release of Information   rga  Documents for the Encounter  AOB (Assignment of Insurance Benefits) Not Received    E-signature AOB Signed 09/12/18   MEDICARE RIGHTS Not Received    E-signature Medicare Rights     ED Patient Billing Extract   ED PB Billing Extract  Cardiac Monitoring Strip Received 09/12/18   Cardiac Monitoring Strip Shift Summary Received 09/12/18   EKG Received 09/13/18   Admission Information   Attending Provider Admitting Provider Admission Type Admission Date/Time  Barton Dubois, MD Bethena Roys, MD Emergency 09/12/18 1514  Discharge Date Hospital Service Auth/Cert Status Service Area   Internal Medicine Incomplete [facility]  Unit Room/Bed Admission Status   AP-DEPT 300 A331/A331-01 Admission (Confirmed)   Admission   Complaint  shortness of breath  Hospital Account   Name Acct ID Class Status Primary Coverage  [place], [place] [id number] Inpatient Open BLUE CROSS BLUE SHIELD - BCBS OTHER      Guarantor Account (for Hospital Account [id number])   Name Relation to Pt Service Area Active? Acct Type  Dorris Fetch R Self CHSA Yes Personal/Family  Address Phone    [place], [place] [address]  [phone])        Coverage Information (for Hospital Account [id number])   F/O Payor/Plan Precert #  [facility] SHIELD/BCBS OTHER   Subscriber Subscriber #  Rajah, Lamb[place] [id number]  Address Phone  [address] [place], [place] [address] [phone]       Care Everywhere ID:  [id number]

## 2018-09-15 NOTE — Care Management Note (Signed)
Case Management Note  Patient Details  Name: Michelle David MRN: [id number] Date of Birth: 05/08/1961  Subjective/Objective:       Admitted with CAP, has hx of CHF. Pt from home, ind with ADL's. Has insurance and PCP. Will need home oxygen at DC, has chosen [facility] from list of DME providers.              Action/Plan: DC home today with home O2. Referral sent to [facility] and port O2 device will be delivered to pt room prior to DC.  Expected Discharge Date:    09/15/18              Expected Discharge Plan:  Home/Self Care  In-House Referral:  NA  Discharge planning Services  CM Consult  Post Acute Care Choice:  Durable Medical Equipment Choice offered to:  Patient  DME Arranged:  Oxygen DME Agency:  [facility]  Status of Service:  Completed, signed off  Sherald Barge, RN 09/15/2018, 10:50 AM

## 2018-09-16 LAB — LEGIONELLA PNEUMOPHILA SEROGP 1 UR AG: L. PNEUMOPHILA SEROGP 1 UR AG: NEGATIVE

## 2018-09-18 LAB — CULTURE, BLOOD (ROUTINE X 2)
CULTURE: NO GROWTH
Culture: NO GROWTH
SPECIAL REQUESTS: ADEQUATE
Special Requests: ADEQUATE

## 2018-09-21 ENCOUNTER — Telehealth: Payer: Self-pay

## 2018-09-21 NOTE — Telephone Encounter (Signed)
Transition Care Management Follow-up Telephone Call   Date discharged?    09-15-18           How have you been since you were released from the hospital? On oxygen, but doing better   Do you understand why you were in the hospital? Pneumonia   Do you understand the discharge instructions? Yes    Where were you discharged to? Home    Items Reviewed:  Medications reviewed: Yes   Allergies reviewed: Yes   Dietary changes reviewed: heart healthy   Referrals reviewed: Yes    Functional Questionnaire:   Activities of Daily Living (ADLs):   Able to perform with help of her husband    Any transportation issues/concerns?: None   Any patient concerns?  None right now    Confirmed importance and date/time of follow-up visits scheduled Scheduled for 09-23-18     Confirmed with patient if condition begins to worsen call PCP or go to the ER.  Patient was given the office number and encouraged to call back with question or concerns.  :

## 2018-09-23 ENCOUNTER — Ambulatory Visit (INDEPENDENT_AMBULATORY_CARE_PROVIDER_SITE_OTHER): Payer: BLUE CROSS/BLUE SHIELD | Admitting: Family Medicine

## 2018-09-23 ENCOUNTER — Encounter: Payer: Self-pay | Admitting: Family Medicine

## 2018-09-23 VITALS — BP 148/100 | HR 88 | Resp 12 | Ht 60.0 in | Wt 221.1 lb

## 2018-09-23 DIAGNOSIS — J189 Pneumonia, unspecified organism: Secondary | ICD-10-CM | POA: Diagnosis not present

## 2018-09-23 DIAGNOSIS — J90 Pleural effusion, not elsewhere classified: Secondary | ICD-10-CM

## 2018-09-23 DIAGNOSIS — I10 Essential (primary) hypertension: Secondary | ICD-10-CM

## 2018-09-23 DIAGNOSIS — Z09 Encounter for follow-up examination after completed treatment for conditions other than malignant neoplasm: Secondary | ICD-10-CM

## 2018-09-23 DIAGNOSIS — R0683 Snoring: Secondary | ICD-10-CM

## 2018-09-23 NOTE — Patient Instructions (Addendum)
Please change f/u to first week in [place], she is  To be put out of work from the day of admission until November 01, 2018  Need to continue oxygen  Chem 7 and EGFr tioday

## 2018-09-24 LAB — BASIC METABOLIC PANEL WITH GFR
BUN: 9 mg/dL (ref 7–25)
CHLORIDE: 97 mmol/L — AB (ref 98–110)
CO2: 38 mmol/L — ABNORMAL HIGH (ref 20–32)
Calcium: 9.2 mg/dL (ref 8.6–10.4)
Creat: 0.88 mg/dL (ref 0.50–1.05)
GFR, Est African American: 86 mL/min/{1.73_m2} (ref >=60)
GFR, Est Non African American: 74 mL/min/{1.73_m2} (ref >=60)
GLUCOSE: 72 mg/dL (ref 65–139)
Potassium: 3.8 mmol/L (ref 3.5–5.3)
SODIUM: 139 mmol/L (ref 135–146)

## 2018-09-26 ENCOUNTER — Encounter: Payer: Self-pay | Admitting: Family Medicine

## 2018-09-26 ENCOUNTER — Other Ambulatory Visit: Payer: Self-pay | Admitting: Family Medicine

## 2018-09-26 DIAGNOSIS — J189 Pneumonia, unspecified organism: Secondary | ICD-10-CM

## 2018-09-26 DIAGNOSIS — R0902 Hypoxemia: Secondary | ICD-10-CM

## 2018-09-26 NOTE — Assessment & Plan Note (Signed)
Hospital course reviewed with patient and questions/ concerns addressed. Medication reconciliation done Labs required ordered and chest scan to be repeated  In 4 weeks Needs referral for sleep study and will ;likely need pulmonary evaluation to determine ongoing need for oxygen esp in light of co morbidity of heart failure , in the context of her ability to return to work

## 2018-09-26 NOTE — Progress Notes (Signed)
   Michelle David     MRN: [id number]      DOB: 08-Jan-1961   HPI Michelle David is here for follow up of hospitalization from 11/24 to 09/15/2018 when she was admitted with multifocal pneumonia and a large right pleural effusion discharged on supplemental oxygen , ;long term need to be decided,. Other dx include chronic combined systolic and diastolic heart failure, acute respiratory failure with hypoxia Hospital course and studies reviewed with patient. She needs a sleep study and cardiology follow up as well as a likely pulmonary consult due to complexity of her chest scan and new oxygen requirement  ROS See HPI Still c/o exertional fatigue, poor exercise tolerance and requires supplemental oxygen continually Denies abdominal pain, nausea, vomiting,diarrhea or constipation.   Denies dysuria, frequency, hesitancy or incontinence. Denies joint pain, swelling and limitation in mobility. Denies headaches, seizures, numbness, or tingling. Denies skin break down or rash.   PE  BP (!) 148/100 (BP Location: Left Arm, Patient Position: Sitting, Cuff Size: Large)   Pulse 88   Resp 12   Ht 5' (1.524 m)   Wt 221 lb 1.9 oz (100.3 kg)   HC 2" (5.1 cm)   SpO2 94% Comment: with 2lpm supplemental oxygen  BMI 43.18 kg/m   Patient alert and oriented and in mild/ cardiopulmonary distress.  HEENT: No facial asymmetry, EOMI,   oropharynx pink and moist.  Neck supple no JVD, no mass.  Chest: decreased air entry bilaterally,  Most marked on right lower lobe with branchial breath sounds  CVS: S1, S2 no murmurs, no S3.Regular rate.  ABD: Soft non tender.   Ext: No edema  MS: Adequate ROM spine, shoulders, hips and knees.  Skin: Intact, no ulcerations or rash noted.  Psych: Good eye contact, normal affect. Memory intact not anxious or depressed appearing.  CNS: CN 2-12 intact, power,  normal throughout.no focal deficits noted.   [facility] discharge follow-up Hospital  course reviewed with patient and questions/ concerns addressed. Medication reconciliation done Labs required ordered and chest scan to be repeated  In 4 weeks Needs referral for sleep study and will ;likely need pulmonary evaluation to determine ongoing need for oxygen esp in light of co morbidity of heart failure , in the context of her ability to return to work

## 2018-09-29 DIAGNOSIS — J189 Pneumonia, unspecified organism: Secondary | ICD-10-CM

## 2018-09-29 DIAGNOSIS — J9601 Acute respiratory failure with hypoxia: Secondary | ICD-10-CM

## 2018-09-29 DIAGNOSIS — J181 Lobar pneumonia, unspecified organism: Secondary | ICD-10-CM

## 2018-09-29 DIAGNOSIS — I5042 Chronic combined systolic (congestive) and diastolic (congestive) heart failure: Secondary | ICD-10-CM

## 2018-10-12 ENCOUNTER — Ambulatory Visit (HOSPITAL_COMMUNITY): Payer: BLUE CROSS/BLUE SHIELD

## 2018-10-27 ENCOUNTER — Ambulatory Visit: Payer: BLUE CROSS/BLUE SHIELD | Admitting: Family Medicine

## 2018-10-28 ENCOUNTER — Telehealth: Payer: Self-pay | Admitting: Family Medicine

## 2018-10-28 NOTE — Telephone Encounter (Signed)
I have paperwork documenting approval for patient's chest scan dated 09/28/2018. I am leaving this in your area. She would have needed the f/u scan early Jan, , 4 to 6 weeks after the first abnormal scan  I do not see the f/u actually scheduled , can you help to sort this out please, thank you

## 2018-11-01 ENCOUNTER — Ambulatory Visit: Payer: BLUE CROSS/BLUE SHIELD | Admitting: Family Medicine

## 2018-11-01 ENCOUNTER — Encounter: Payer: Self-pay | Admitting: Family Medicine

## 2018-11-01 ENCOUNTER — Ambulatory Visit (INDEPENDENT_AMBULATORY_CARE_PROVIDER_SITE_OTHER): Payer: Managed Care, Other (non HMO) | Admitting: Family Medicine

## 2018-11-01 VITALS — BP 146/90 | HR 96 | Resp 14 | Ht 62.0 in | Wt 214.0 lb

## 2018-11-01 DIAGNOSIS — J189 Pneumonia, unspecified organism: Secondary | ICD-10-CM

## 2018-11-01 DIAGNOSIS — R0902 Hypoxemia: Secondary | ICD-10-CM

## 2018-11-01 DIAGNOSIS — R350 Frequency of micturition: Secondary | ICD-10-CM

## 2018-11-01 DIAGNOSIS — I10 Essential (primary) hypertension: Secondary | ICD-10-CM

## 2018-11-01 HISTORY — DX: Hypoxemia: R09.02

## 2018-11-01 HISTORY — DX: Pneumonia, unspecified organism: J18.9

## 2018-11-01 LAB — POCT URINALYSIS DIP (CLINITEK)
BILIRUBIN UA: NEGATIVE
Blood, UA: NEGATIVE
Glucose, UA: NEGATIVE mg/dL
Ketones, POC UA: NEGATIVE mg/dL
LEUKOCYTES UA: NEGATIVE
NITRITE UA: NEGATIVE
PH UA: 7 (ref 5.0–8.0)
POC PROTEIN,UA: NEGATIVE
Spec Grav, UA: 1.02 (ref 1.010–1.025)
Urobilinogen, UA: 1 E.U./dL

## 2018-11-01 MED ORDER — ATORVASTATIN CALCIUM 40 MG PO TABS: 40.0000 mg | ORAL_TABLET | Freq: Every day | ORAL | 11 refills | Status: DC

## 2018-11-01 NOTE — Assessment & Plan Note (Addendum)
rept chest scan needed ,prior to return to work

## 2018-11-01 NOTE — Progress Notes (Signed)
Michelle David     MRN: [id number]      DOB: 01/18/1961   HPI Michelle David is here for follow up and re-evaluation of chronic medical conditions, She is here for follow up, was taken out of work and should have had both cardiology and pulmonary evaluations as well as a repeat chest scan, none of these have been done and she understands that without these evaluations I am unable to determine her ability to resume work and only a letter will be provided with this informnmation. Tearful and overwhelmed due to inability to work and no income, states feels very tired with minimal exertion, has to sit and rest C/o left flank pain with cloudy urine and increased frequency x several days  ROS Denies recent fever or chills. Denies sinus pressure, nasal congestion, ear pain or sore throat.  Denies abdominal pain, nausea, vomiting,diarrhea or constipation.   . Denies skin break down or rash.   PE  BP (!) 146/90 (BP Location: Left Arm, Patient Position: Sitting, Cuff Size: Large)   Pulse 96   Resp 14   Ht _0  (1.575 m)   Wt 214 lb 0.6 oz (97.1 kg)   SpO2 93% Comment: with 2 lpm oxygen  BMI 39.15 kg/m   With ambulation and no oxygen, pt desats to 86% and HR increased to 126 Once oxygen was reapplied at 2l/min her pulse ox returned to 92%  Patient alert and oriented tearful and in CP distress without oxygen  HEENT: No facial asymmetry, EOMI,   oropharynx pink and moist.  Neck supple no JVD, no mass.  Chest: Clear to auscultation bilaterally.Decreased air entry bilaterally, no crackles or wheezes  CVS: S1, S2 no murmurs, no S3.Regular rate.No JVD  ABD: Soft non tender.   Ext: No edema  MS: Adequate ROM spine, shoulders, hips and knees.  Skin: Intact, no ulcerations or rash noted.  Psych: Good eye contact, tearful, cring , anxious and depressed  CNS: CN 2-12 intact, power,  normal throughout.no focal deficits noted.   Assessment & Plan  Multifocal pneumonia rept chest  scan needed ,prior to return to work  Urinary frequency 5 day h/o frequency, cloudy urine and flank pain, will test for infection  Hypoxia Requirement for chronic oxygent met , desaturates with limited activity to less than 86%, oxygen returns to 92% when she is put back on oxygen  Morbid obesity Deteriorated. Patient re-educated about  the importance of commitment to a  minimum of 150 minutes of exercise per week.  The importance of healthy food choices with portion control discussed. Encouraged to start a food diary, count calories and to consider  joining a support group. Sample diet sheets offered. Goals set by the patient for the next several months.   Weight /BMI 11/01/2018 09/23/2018 09/14/2018  WEIGHT 214 lb 0.6 oz 221 lb 1.9 oz 220 lb 7.4 oz  HEIGHT _1  _2  -  BMI 39.15 kg/m2 43.18 kg/m2 -      HTN (hypertension), malignant Not at goal, reviewed with pt her need to  take all medication as directed DASH diet and commitment to daily physical activity for a minimum of 30 minutes discussed and encouraged, as a part of hypertension management. The importance of attaining a healthy weight is also discussed.  BP/Weight 11/01/2018 09/23/2018 09/15/2018 09/14/2018 09/09/2018 08/18/2018 72/53/6644  Systolic BP 034 742 595 - 638 756 433  Diastolic BP 90 295 66 - 82 103 94  Wt. (Lbs) 214.04 221.12 -  220.46 223 - 228  BMI 39.15 43.18 - 40.32 40.79 - 41.7

## 2018-11-01 NOTE — Patient Instructions (Signed)
F/u in 4.5 months, call if you need me sooner  It is vital that you keep appointments with cardiology, pulmonary and also get the repeat chest scanYour oxygen falls to 86% off of  Oxygen and your heart rate increases over 100 when you walk for a short distance   I do recommend starting the p[rocess of permanent disability application , social services/ security  is the office you check with  Urine being tested for infection

## 2018-11-01 NOTE — Assessment & Plan Note (Addendum)
Requirement for chronic oxygent met , desaturates with limited activity to less than 86%, oxygen returns to 92% when she is put back on oxygen

## 2018-11-01 NOTE — Telephone Encounter (Signed)
She missed her last appt but she said she will call today to get it rescheduled so she can hopefully get before she goes back to work

## 2018-11-01 NOTE — Assessment & Plan Note (Signed)
5 day h/o frequency, cloudy urine and flank pain, will test for infection

## 2018-11-01 NOTE — Telephone Encounter (Signed)
noted 

## 2018-11-04 ENCOUNTER — Ambulatory Visit (INDEPENDENT_AMBULATORY_CARE_PROVIDER_SITE_OTHER): Payer: Managed Care, Other (non HMO) | Admitting: Student

## 2018-11-04 ENCOUNTER — Encounter: Payer: Self-pay | Admitting: Student

## 2018-11-04 ENCOUNTER — Encounter: Payer: Self-pay | Admitting: Family Medicine

## 2018-11-04 VITALS — BP 148/92 | HR 88 | Wt 214.0 lb

## 2018-11-04 DIAGNOSIS — I5042 Chronic combined systolic (congestive) and diastolic (congestive) heart failure: Secondary | ICD-10-CM

## 2018-11-04 DIAGNOSIS — I428 Other cardiomyopathies: Secondary | ICD-10-CM | POA: Diagnosis not present

## 2018-11-04 DIAGNOSIS — E782 Mixed hyperlipidemia: Secondary | ICD-10-CM

## 2018-11-04 DIAGNOSIS — I10 Essential (primary) hypertension: Secondary | ICD-10-CM | POA: Diagnosis not present

## 2018-11-04 DIAGNOSIS — Z79899 Other long term (current) drug therapy: Secondary | ICD-10-CM

## 2018-11-04 MED ORDER — LOSARTAN POTASSIUM 25 MG PO TABS: 25.0000 mg | ORAL_TABLET | Freq: Every day | ORAL | 3 refills | Status: DC

## 2018-11-04 NOTE — Assessment & Plan Note (Signed)
Deteriorated. Patient re-educated about  the importance of commitment to a  minimum of 150 minutes of exercise per week.  The importance of healthy food choices with portion control discussed. Encouraged to start a food diary, count calories and to consider  joining a support group. Sample diet sheets offered. Goals set by the patient for the next several months.   Weight /BMI 11/01/2018 09/23/2018 09/14/2018  WEIGHT 214 lb 0.6 oz 221 lb 1.9 oz 220 lb 7.4 oz  HEIGHT 5\' 2"  5\' 0"  -  BMI 39.15 kg/m2 43.18 kg/m2 -

## 2018-11-04 NOTE — Progress Notes (Signed)
Cardiology Office Note    Date:  11/04/2018   ID:  [name], [name] 12/12/1960, MRN [id number]  PCP:  Fayrene Helper, MD  Cardiologist: Rozann Lesches, MD    Chief Complaint  Patient presents with  . Follow-up    6 week visit    History of Present Illness:    Michelle David is a 58 y.o. female with past medical history of presumed nonischemic cardiomyopathy (EF 30-35% by echo in 12/2016, improved to 45-50% in 07/2018), HTN, and HLD who presents to the office today for 6-week follow-up.  She was last examined by Dr. Domenic Polite in 08/2018 reported overall doing well from a cardiac perspective at that time. Recent echocardiogram showed her EF had improved to 45 to 50% as compared to prior studies. She had been unable to afford Entresto and had stopped the medication, therefore Coreg was increased to 25 mg twice daily at the time of her office visit and she was started back on Losartan 12.5 mg daily and instructed to use Lasix as needed.  In the interim, she was admitted to [facility] later that month for acute hypoxic respiratory failure in the setting of right lower lobe pneumonia and a right pleural effusion. She was started on IV antibiotic therapy which was transitioned to Augmentin at the time of discharge. Was recommended to take Lasix 20 mg daily for 3 consecutive days then reduce back to as needed dosing for she refused a thoracentesis during admission.  In talking with the patient today, she reports that her respiratory status has improved since her recent hospitalization but is not back to baseline. She still remains on 2 L nasal cannula and is scheduled for a repeat Chest CT next week.  She denies any specific orthopnea, PND, or lower extremity edema. Says that weight has declined by over 10 pounds within the past few months. No recent chest pain or palpitations.  She does not check her blood pressure regularly at home and it was initially elevated to 154/110 during  today's visit, slightly improved to 148/92 on recheck. Does report good compliance with her current medication regimen.  Past Medical History:  Diagnosis Date  . Cardiomyopathy [facility])    Diagnosed March 2018  . CHF (congestive heart failure) ([place])    a. EF 30-35% by echo in 12/2016 b. EF improved to 45-50% in 07/2018  . Dyspepsia   . Essential hypertension   . Hyperlipidemia   . Impaired glucose tolerance   . Obesity     Past Surgical History:  Procedure Laterality Date  . COLONOSCOPY N/A 03/19/2018   Procedure: COLONOSCOPY;  Surgeon: Danie Binder, MD;  Location: [facility];  Service: Endoscopy;  Laterality: N/A;  2:00  . OOPHORECTOMY    . TUBAL LIGATION      Current Medications: Outpatient Medications Prior to Visit  Medication Sig Dispense Refill  . atorvastatin (LIPITOR) 40 MG tablet Take 1 tablet (40 mg total) by mouth daily. 30 tablet 11  . carvedilol (COREG) 25 MG tablet Take 0.5 tablets (12.5 mg total) by mouth 2 (two) times daily.    . furosemide (LASIX) 40 MG tablet Take 40 mg daily if you gain more than 2- 3 lbs in 24 hours. For the next 3 days take 20mg  daily 60 tablet 3  . losartan (COZAAR) 25 MG tablet Take 0.5 tablets (12.5 mg total) by mouth daily. 45 tablet 3   No facility-administered medications prior to visit.  Allergies:   Patient has no known allergies.   Social History   Socioeconomic History  . Marital status: Married    Spouse name: Not on file  . Number of children: Not on file  . Years of education: Not on file  . Highest education level: Not on file  Occupational History  . Occupation: Media planner: [employer]  . Financial resource strain: Not on file  . Food insecurity:    Worry: Not on file    Inability: Not on file  . Transportation needs:    Medical: Not on file    Non-medical: Not on file  Tobacco Use  . Smoking status: Never Smoker  . Smokeless tobacco: Never Used  Substance and  Sexual Activity  . Alcohol use: Not Currently    Comment: special occasions  . Drug use: No  . Sexual activity: Not Currently  Lifestyle  . Physical activity:    Days per week: Not on file    Minutes per session: Not on file  . Stress: Not on file  Relationships  . Social connections:    Talks on phone: Not on file    Gets together: Not on file    Attends religious service: Not on file    Active member of club or organization: Not on file    Attends meetings of clubs or organizations: Not on file    Relationship status: Not on file  Other Topics Concern  . Not on file  Social History Narrative  . Not on file   Family History:  The patient's family history includes Heart disease in her mother; Hypertension in her mother; Lung cancer in her maternal aunt.   Review of Systems:   Please see the history of present illness.     General:  No chills, fever, night sweats or weight changes.  Cardiovascular:  No chest pain, edema, orthopnea, palpitations, paroxysmal nocturnal dyspnea. Positive for dyspnea on exertion.  Dermatological: No rash, lesions/masses Respiratory: Positive for intermittent cough and dyspnea. Urologic: No hematuria, dysuria Abdominal:   No nausea, vomiting, diarrhea, bright red blood per rectum, melena, or hematemesis Neurologic:  No visual changes, wkns, changes in mental status. All other systems reviewed and are otherwise negative except as noted above.   Physical Exam:    VS:  BP (!) 148/92   Pulse 88   Wt 214 lb (97.1 kg)   SpO2 96%   BMI 39.14 kg/m    General: Well developed, well nourished,female appearing in no acute distress. Head: Normocephalic, atraumatic, sclera non-icteric, no xanthomas, nares are without discharge.  Neck: No carotid bruits. JVD not elevated.  Lungs: Respirations regular and unlabored, rales along right base.  Heart: Regular rate and rhythm. No S3 or S4.  No murmur, no rubs, or gallops appreciated. Abdomen: Soft, non-tender,  non-distended with normoactive bowel sounds. No hepatomegaly. No rebound/guarding. No obvious abdominal masses. Msk:  Strength and tone appear normal for age. No joint deformities or effusions. Extremities: No clubbing or cyanosis. No lower extremity edema.  Distal pedal pulses are 2+ bilaterally. Neuro: Alert and oriented X 3. Moves all extremities spontaneously. No focal deficits noted. Psych:  Responds to questions appropriately with a normal affect. Skin: No rashes or lesions noted  Wt Readings from Last 3 Encounters:  11/04/18 214 lb (97.1 kg)  11/01/18 214 lb 0.6 oz (97.1 kg)  09/23/18 221 lb 1.9 oz (100.3 kg)     Studies/Labs Reviewed:  EKG:  EKG is not ordered today.   Recent Labs: 08/02/2018: TSH 2.72 09/12/2018: ALT 55; B Natriuretic Peptide 61.0 09/15/2018: Hemoglobin 11.4; Platelets 354 09/23/2018: BUN 9; Creat 0.88; Potassium 3.8; Sodium 139   Lipid Panel    Component Value Date/Time   CHOL 165 08/02/2018 1631   TRIG 84 08/02/2018 1631   HDL 45 (L) 08/02/2018 1631   CHOLHDL 3.7 08/02/2018 1631   VLDL 26 01/15/2017 1816   LDLCALC 103 (H) 08/02/2018 1631    Additional studies/ records that were reviewed today include:   Echocardiogram: 07/2018 Study Conclusions  - Left ventricle: The cavity size was normal. Wall thickness was   increased in a pattern of moderate LVH. Systolic function was   mildly reduced. The estimated ejection fraction was in the range   of 45% to 50%. Doppler parameters are consistent with abnormal   left ventricular relaxation (grade 1 diastolic dysfunction). - Aortic valve: Valve area (VTI): 1.86 cm^2. Valve area (Vmax):   2.01 cm^2. Valve area (Vmean): 1.97 cm^2. - Mitral valve: There was mild regurgitation. - Technically difficult study. Echocontrast was used to enhance   visualization.  Assessment:    1. Chronic combined systolic and diastolic CHF (congestive heart failure) ([place])   2. Non-ischemic cardiomyopathy ([place])   3.  Essential hypertension   4. Mixed hyperlipidemia   5. Medication management      Plan:   In order of problems listed above:  1. Chronic Combined Systolic and Diastolic CHF/Nonischemic Cardiomyopathy - EF previously reduced to 30-35% by echo in 12/2016, improved to 45-50% in 07/2018. She reports having baseline dyspnea on exertion since her admission in 08/2018 for right lower lobe pneumonia and a pleural effusion. She does have rales along her right base on examination and I suspect she has a lingering effusion. She only takes Lasix as needed due to increased urination and I recommended she take this daily for at least the next 3 days then as needed for weight gain or edema. She is scheduled for a repeat Chest CT next week for reassessment. We reviewed that she may require a thoracentesis if this has not improved (patient declined during admission).  - she is currently on Coreg 12.5mg  BID and Losartan 12.5mg  daily (unable to afford Entresto). Will plan to titrate Losartan to 25mg  daily and obtain a repeat BMET in 2 weeks. Once respiratory status improves, would recommend further titration of Coreg to 25mg  BID pending BP response.   2. HTN - BP initially elevated to 154/110 during today's visit, slightly improved to 148/92 on recheck.  She is currently taking Coreg 12.5 mg twice daily (reduced from 25 mg daily during admission in 08/2018) and Losartan 12.5 mg daily. Will plan to further titrate Losartan to 25 mg daily as outlined above.   3. HLD - Followed by PCP. FLP in 07/2018 showed total cholesterol 165, HDL 45, triglycerides 84, and LDL 103. She remains on Atorvastatin 40 mg daily.    Medication Adjustments/Labs and Tests Ordered: Current medicines are reviewed at length with the patient today.  Concerns regarding medicines are outlined above.  Medication changes, Labs and Tests ordered today are listed in the Patient Instructions below. Patient Instructions  Medication Instructions:    Your physician has recommended you make the following change in your medication:  Increase Losartan to 25 mg Daily  Take Lasix daily for 3 days then resume taking as needed  If you need a refill on your cardiac medications before your next appointment, please  call your pharmacy.   Lab work: Your physician recommends that you return for lab work in: 2 weeks ( 11/18/18)  If you have labs (blood work) drawn today and your tests are completely normal, you will receive your results only by: Marland Kitchen MyChart Message (if you have MyChart) OR . A paper copy in the mail If you have any lab test that is abnormal or we need to change your treatment, we will call you to review the results.  Testing/Procedures: NONE   Follow-Up: At [facility], you and your health needs are our priority.  As part of our continuing mission to provide you with exceptional heart care, we have created designated Provider Care Teams.  These Care Teams include your primary Cardiologist (physician) and Advanced Practice Providers (APPs -  Physician Assistants and Nurse Practitioners) who all work together to provide you with the care you need, when you need it. You will need a follow up appointment in 2 months.  Please call our office 2 months in advance to schedule this appointment.  You may see Rozann Lesches, MD or one of the following Advanced Practice Providers on your designated Care Team:   Bernerd Pho, PA-C [facility]) . Ermalinda Barrios, PA-C ([place])  Any Other Special Instructions Will Be Listed Below (If Applicable). Thank you for choosing [facility]!     Signed, Erma Heritage, PA-C  11/04/2018 3:43 PM     [facility] [address] [place], [place] [address] Phone: [phone] Fax: [phone]

## 2018-11-04 NOTE — Assessment & Plan Note (Signed)
Not at goal, reviewed with pt her need to  take all medication as directed DASH diet and commitment to daily physical activity for a minimum of 30 minutes discussed and encouraged, as a part of hypertension management. The importance of attaining a healthy weight is also discussed.  BP/Weight 11/01/2018 09/23/2018 09/15/2018 09/14/2018 09/09/2018 08/18/2018 79/98/7215  Systolic BP 872 761 848 - 592 763 943  Diastolic BP 90 200 66 - 82 103 94  Wt. (Lbs) 214.04 221.12 - 220.46 223 - 228  BMI 39.15 43.18 - 40.32 40.79 - 41.7

## 2018-11-04 NOTE — Patient Instructions (Signed)
Medication Instructions:  Your physician has recommended you make the following change in your medication:  Increase Losartan to 25 mg Daily  Take Lasix daily for 3 days then resume taking as needed  If you need a refill on your cardiac medications before your next appointment, please call your pharmacy.   Lab work: Your physician recommends that you return for lab work in: 2 weeks ( 11/18/18)  If you have labs (blood work) drawn today and your tests are completely normal, you will receive your results only by: Marland Kitchen MyChart Message (if you have MyChart) OR . A paper copy in the mail If you have any lab test that is abnormal or we need to change your treatment, we will call you to review the results.  Testing/Procedures: NONE   Follow-Up: At [facility], you and your health needs are our priority.  As part of our continuing mission to provide you with exceptional heart care, we have created designated Provider Care Teams.  These Care Teams include your primary Cardiologist (physician) and Advanced Practice Providers (APPs -  Physician Assistants and Nurse Practitioners) who all work together to provide you with the care you need, when you need it. You will need a follow up appointment in 2 months.  Please call our office 2 months in advance to schedule this appointment.  You may see Michelle Lesches, MD or one of the following Advanced Practice Providers on your designated Care Team:   Michelle Pho, PA-C [facility]) . Michelle Barrios, PA-C ([place])  Any Other Special Instructions Will Be Listed Below (If Applicable). Thank you for choosing [facility]!

## 2018-11-12 ENCOUNTER — Ambulatory Visit (HOSPITAL_COMMUNITY): Payer: Managed Care, Other (non HMO)

## 2018-11-19 LAB — BASIC METABOLIC PANEL
BUN: 14 mg/dL (ref 7–25)
CHLORIDE: 102 mmol/L (ref 98–110)
CO2: 30 mmol/L (ref 20–32)
Calcium: 9.2 mg/dL (ref 8.6–10.4)
Creat: 0.82 mg/dL (ref 0.50–1.05)
Glucose, Bld: 86 mg/dL (ref 65–139)
Potassium: 3.8 mmol/L (ref 3.5–5.3)
Sodium: 139 mmol/L (ref 135–146)

## 2018-12-02 ENCOUNTER — Ambulatory Visit (HOSPITAL_COMMUNITY): Payer: Managed Care, Other (non HMO)

## 2018-12-22 ENCOUNTER — Ambulatory Visit (HOSPITAL_COMMUNITY): Payer: Self-pay

## 2019-01-10 ENCOUNTER — Telehealth: Payer: Self-pay | Admitting: Physician Assistant

## 2019-01-10 NOTE — Telephone Encounter (Signed)
° °  Cardiac Questionnaire:    Since your last visit or hospitalization:  1. Have you been having new or worsening chest pain? No   2. Have you been having new or worsening shortness of breath? No  3. Have you been having new or worsening leg swelling, wt gain, or increase in abdominal girth (pants fitting more tightly)? No  4. Have you had any passing out spells? No    *A YES to any of these questions would result in the appointment being kept. *If all the answers to these questions are NO, we should indicate that given the current situation regarding the worldwide coronarvirus pandemic, at the recommendation of the CDC, we are looking to limit gatherings in our waiting area, and thus will reschedule their appointment beyond four weeks from today.   _____________   COVID-19 Pre-Screening Questions:   Do you currently have a fever? No (yes = cancel and refer to pcp for e-visit)  Have you recently travelled on a cruise, internationally, or to [place], [place], [place], [place], [place], or [place], [place] [employer]) ? No (yes = cancel, stay home, monitor symptoms, and contact pcp or initiate e-visit if symptoms develop)  Have you been in contact with someone that is currently pending confirmation of Covid19 testing or has been confirmed to have the [place] virus?  No (yes = cancel, stay home, away from tested individual, monitor symptoms, and contact pcp or initiate e-visit if symptoms develop)  Are you currently experiencing fatigue or cough? No (yes = pt should be prepared to have a mask placed at the time of their visit).   Patient rescheduled for 6 wks out. Patient was in agrreance.

## 2019-01-10 NOTE — Telephone Encounter (Signed)
Left message for patient to call back before her upcoming appointment with Dr. Domenic Polite 01/13/2019 in lieu of the recent coronavirus.  Please do screening questions before she comes in or cancel appointment if appropriate.

## 2019-01-13 ENCOUNTER — Ambulatory Visit: Payer: Managed Care, Other (non HMO) | Admitting: Cardiology

## 2019-02-03 ENCOUNTER — Ambulatory Visit: Payer: Self-pay | Admitting: Cardiology

## 2019-03-02 ENCOUNTER — Telehealth: Payer: Self-pay | Admitting: Cardiology

## 2019-03-02 NOTE — Telephone Encounter (Signed)
Returned call to pt. NA, VM not set up yet.

## 2019-03-02 NOTE — Telephone Encounter (Signed)
Patient called stating that for the past 2 weeks she is having chest pains (states that she feels like she is having pins sticking in her.  States that she has had shortness of breath off and on. States that she uses 02 when she gets SOB.  Please call ([phone]) .

## 2019-03-02 NOTE — Telephone Encounter (Signed)
Called pt. No answer, no voicemail

## 2019-03-08 NOTE — Telephone Encounter (Signed)
Called pt, no answer. Unable to leave voicemail. I will mail pt letter asking her to return call if she is still having issues.

## 2019-03-15 NOTE — Telephone Encounter (Signed)
Pt received the letter in the mail and is calling back [phone] please call that number

## 2019-03-15 NOTE — Telephone Encounter (Signed)
Pt has no device, I think this was sent to wrong pool. Thanks!

## 2019-03-15 NOTE — Telephone Encounter (Signed)
Patient contacted about the symptoms she called about one week ago. Patient said that all of her symptoms have resolved. Patient reminded of her upcoming appointment on 04/26/2019 with Dr. Domenic Polite and advised to contact our office if he symptoms return. Verbalized understanding

## 2019-03-16 ENCOUNTER — Ambulatory Visit: Payer: Managed Care, Other (non HMO) | Admitting: Family Medicine

## 2019-03-24 ENCOUNTER — Encounter: Payer: Self-pay | Admitting: Family Medicine

## 2019-03-24 ENCOUNTER — Ambulatory Visit (INDEPENDENT_AMBULATORY_CARE_PROVIDER_SITE_OTHER): Payer: Self-pay | Admitting: Family Medicine

## 2019-03-24 ENCOUNTER — Encounter (INDEPENDENT_AMBULATORY_CARE_PROVIDER_SITE_OTHER): Payer: Self-pay

## 2019-03-24 ENCOUNTER — Other Ambulatory Visit: Payer: Self-pay

## 2019-03-24 VITALS — BP 156/98 | HR 83 | Temp 98.9°F | Resp 12 | Ht 62.0 in | Wt 218.0 lb

## 2019-03-24 DIAGNOSIS — I10 Essential (primary) hypertension: Secondary | ICD-10-CM

## 2019-03-24 DIAGNOSIS — E041 Nontoxic single thyroid nodule: Secondary | ICD-10-CM

## 2019-03-24 DIAGNOSIS — R221 Localized swelling, mass and lump, neck: Secondary | ICD-10-CM

## 2019-03-24 MED ORDER — CARVEDILOL 25 MG PO TABS: 12.5000 mg | ORAL_TABLET | Freq: Two times a day (BID) | ORAL | Status: DC

## 2019-03-24 MED ORDER — FUROSEMIDE 40 MG PO TABS: ORAL_TABLET | ORAL | 3 refills | Status: DC

## 2019-03-24 NOTE — Patient Instructions (Addendum)
    Thank you for coming into the office today. I appreciate the opportunity to provide you with the care for your health and wellness. Today we discussed: blood pressure, weight, and bump under skin at neck  Please follow up with [place] in 2 weeks. Take your blood pressure medicines as directed prior to coming in so we can know if your blood pressure medicines need adjustment.  Please work on walking 30 minutes on at least 5 days of the week. This will help with BP, weight loss, and overall health. Avoid drinking sugary sodas as well to promote weight loss.  I am ordering both a thyroid and neck ultrasound. You were recommended to follow up yearly for the thyroid in 2017.  I am also ordering a neck ultrasound to assess the "knot" we feel in the left base aspect of your neck. Once these have been completed we will let you know the results and any future plans if needed.   Please continue to practice social distancing.   [place] YOUR HANDS WELL AND FREQUENTLY. AVOID TOUCHING YOUR FACE, UNLESS YOUR HANDS ARE FRESHLY WASHED.  GET FRESH AIR DAILY. STAY HYDRATED WITH WATER.   It was a pleasure to see you and I look forward to continuing to work together on your health and well-being. Please do not hesitate to call the office if you need care or have questions about your care.  Have a wonderful day and week. With Gratitude, Cherly Beach, DNP, AGNP-BC

## 2019-03-24 NOTE — Progress Notes (Signed)
Subjective:     Patient ID: Michelle David, female   DOB: Aug 18, 1961, 58 y.o.   MRN: [id number]  Michelle David presents for Hypertension  Michelle David is a 58 year old female patient of Dr. Moshe Cipro.  Who presents today for follow-up on her hypertension.  She additionally also has a bump/knot that is come up on her throat that she is concerned about as well.   Hypertension: She reports that she is taking her medications as a directed.  Even though she is taking her Lasix daily while it is stated to take that she has gained weight.  She reports that she did not take it this morning.  And that is why she thinks her blood pressure is high.  In review of the chart she has had demonstrated elevated blood pressure for several years.  Denies having any headaches, dizziness, vision changes, chest pain or other signs or symptoms of elevated BP at this time.  Possibly has become adjusted to the elevation in her blood pressure.  Will need to assess medication regime and have her come back in when she is taking her lisinopril on a regular basis to see if regular diuretic  Bump on neck: She reports that last month she started having left upper neck and some arm pain.  It was sharp and never been that sharp before.  Not long after this discomfort and pain that she just let subsided did not take medicine for she noticed a knot on the lower aspect close to her collarbone.  She reports that is tender to palpitations.  She denies having any injury or trauma to the site.  Reports that it is worse than it had 10.  Today in the office is about a 3-4 out of 10.  She is not taking any meds for pain.  Reports that she previously had a thyroid ultrasound with biopsy secondary to a nodule being found.  She was supposed to follow-up with this this was back in 2017 she has not followed up since.  Overall today in the office she feels like she is doing okay.  Denies having any fevers, chills, shortness of breath, cough or  any other signs or symptoms of COVID or infection.  Denies having any changes in bowel or bladder.  Denies having any changes in memory or sleep.   Past Medical, Surgical, Social History, Allergies, and Medications have been Reviewed.   Past Medical History:  Diagnosis Date  . Cardiomyopathy [facility])    Diagnosed March 2018  . CHF (congestive heart failure) ([place])    a. EF 30-35% by echo in 12/2016 b. EF improved to 45-50% in 07/2018  . Dyspepsia   . Essential hypertension   . Hyperlipidemia   . Impaired glucose tolerance   . Obesity    Past Surgical History:  Procedure Laterality Date  . COLONOSCOPY N/A 03/19/2018   Procedure: COLONOSCOPY;  Surgeon: Danie Binder, MD;  Location[facility];  Service: Endoscopy;  Laterality: N/A;  2:00  . OOPHORECTOMY    . TUBAL LIGATION     Social History   Socioeconomic History  . Marital status: Married    Spouse name: Not on file  . Number of children: Not on file  . Years of education: Not on file  . Highest education level: Not on file  Occupational History  . Occupation: Media planner: [employer]  . Financial resource strain: Not on file  .  Food insecurity:    Worry: Not on file    Inability: Not on file  . Transportation needs:    Medical: Not on file    Non-medical: Not on file  Tobacco Use  . Smoking status: Never Smoker  . Smokeless tobacco: Never Used  Substance and Sexual Activity  . Alcohol use: Not Currently    Comment: special occasions  . Drug use: No  . Sexual activity: Not Currently  Lifestyle  . Physical activity:    Days per week: Not on file    Minutes per session: Not on file  . Stress: Not on file  Relationships  . Social connections:    Talks on phone: Not on file    Gets together: Not on file    Attends religious service: Not on file    Active member of club or organization: Not on file    Attends meetings of clubs or organizations: Not on file     Relationship status: Not on file  . Intimate partner violence:    Fear of current or ex partner: Not on file    Emotionally abused: Not on file    Physically abused: Not on file    Forced sexual activity: Not on file  Other Topics Concern  . Not on file  Social History Narrative  . Not on file    Outpatient Encounter Medications as of 03/24/2019  Medication Sig  . atorvastatin (LIPITOR) 40 MG tablet Take 1 tablet (40 mg total) by mouth daily.  . carvedilol (COREG) 25 MG tablet Take 0.5 tablets (12.5 mg total) by mouth 2 (two) times daily.  . furosemide (LASIX) 40 MG tablet Take 40 mg daily if you gain more than 2- 3 lbs in 24 hours. For the next 3 days take 20mg  daily  . losartan (COZAAR) 25 MG tablet Take 1 tablet (25 mg total) by mouth daily.   No facility-administered encounter medications on file as of 03/24/2019.    No Known Allergies  Review of Systems  Constitutional: Negative for chills and fever.  HENT: Negative.   Eyes: Negative for visual disturbance.  Respiratory: Negative for cough and shortness of breath.   Cardiovascular: Negative.  Negative for chest pain, palpitations and leg swelling.  Gastrointestinal: Negative.   Endocrine: Negative.   Genitourinary: Negative.   Musculoskeletal: Negative.        "bump" left lateral anterior neck  Skin: Negative.   Allergic/Immunologic: Negative.   Neurological: Negative.  Negative for dizziness and headaches.  Hematological: Positive for adenopathy.  Psychiatric/Behavioral: Negative.   All other systems reviewed and are negative.      Objective:     BP (!) 156/98   Pulse 83   Temp 98.9 F (37.2 C) (Oral)   Resp 12   Ht 5\' 2"  (1.575 m)   Wt 218 lb (98.9 kg)   SpO2 97%   BMI 39.87 kg/m   Physical Exam Vitals signs and nursing note reviewed.  Constitutional:      Appearance: Normal appearance. She is obese.  HENT:     Right Ear: External ear normal.     Left Ear: External ear normal.     Nose: Nose normal.   Eyes:     General:        Right eye: No discharge.        Left eye: No discharge.     Conjunctiva/sclera: Conjunctivae normal.  Neck:     Musculoskeletal: Normal range of motion and neck supple.  Comments: Lump on the lateral anterior aspect of the throat/neck: tenderness with palpitation  Thyroid nodule Cardiovascular:     Rate and Rhythm: Normal rate and regular rhythm.     Pulses: Normal pulses.     Heart sounds: Normal heart sounds.  Pulmonary:     Effort: Pulmonary effort is normal.     Breath sounds: Normal breath sounds.  Musculoskeletal: Normal range of motion.     Left shoulder: Normal. She exhibits normal range of motion and no tenderness.  Lymphadenopathy:     Cervical: Cervical adenopathy present.     Left cervical: Superficial cervical adenopathy present.  Skin:    General: Skin is warm.  Neurological:     Mental Status: She is alert and oriented to person, place, and time.  Psychiatric:        Mood and Affect: Mood normal.        Behavior: Behavior normal.        Thought Content: Thought content normal.        Judgment: Judgment normal.        Assessment and Plan        1. HTN (hypertension), malignant Uncontrolled, reports that she did not take her Lasix that she normally takes daily.  Even though it is not written for to take daily.  Reports that she did take her Coreg and her Cozaar.  Secondary to her not taking her Lasix and this is a normal basis for her to take.  We will be bringing her back in 2 weeks for assessment of her blood pressure at that time we will need to look at whether or not we need to keep her on a fluid pill daily and or adjust her medication to increase dosage and or change.    - carvedilol (COREG) 25 MG tablet; Take 0.5 tablets (12.5 mg total) by mouth 2 (two) times daily. - furosemide (LASIX) 40 MG tablet; Take 40 mg daily if you gain more than 2- 3 lbs in 24 hours. For the next 3 days take 20mg  daily  Dispense: 60 tablet; Refill:  3  2. Solitary thyroid nodule Previously biopsied in 2017 was supposed to follow-up in 1 year has not done so since.  Will be getting a ultrasound of the head and neck to look at possibly needing assessment of the ultrasound nodule again.  - US Soft Tissue Head/Neck  3. Mass in neck Noted nodule/mass on the left lateral anterior aspect of the neck close to clavicle.  Appears to be by palpation about 1 inch in size.  Tenderness with palpitation.  Question as to whether or not this is lymph node along the cervical chain versus a nodule or mass secondary to previously found thyroid nodule.  We will be assessing with ultrasound.  - US Soft Tissue Head/Neck   Follow Up: 2 weeks for BP check  Perlie Mayo, DNP, AGNP-BC [place], [place], [place] [address] Office Hours: Mon-Thurs 8 am-5 pm; Fri 8 am-12 pm Office Phone:  [phone]  Office Fax: [phone]

## 2019-03-28 ENCOUNTER — Telehealth: Payer: Self-pay | Admitting: Family Medicine

## 2019-03-28 ENCOUNTER — Other Ambulatory Visit: Payer: Self-pay

## 2019-03-28 MED ORDER — LOSARTAN POTASSIUM 25 MG PO TABS: 25.0000 mg | ORAL_TABLET | Freq: Every day | ORAL | 3 refills | Status: DC

## 2019-03-28 NOTE — Telephone Encounter (Signed)
Losartan refilled 

## 2019-03-28 NOTE — Telephone Encounter (Signed)
Pt needs a Refill on all medicine

## 2019-04-07 ENCOUNTER — Ambulatory Visit (INDEPENDENT_AMBULATORY_CARE_PROVIDER_SITE_OTHER): Payer: Self-pay | Admitting: Family Medicine

## 2019-04-07 ENCOUNTER — Encounter: Payer: Self-pay | Admitting: Family Medicine

## 2019-04-07 ENCOUNTER — Other Ambulatory Visit: Payer: Self-pay

## 2019-04-07 VITALS — BP 130/80 | HR 66 | Temp 98.4°F | Resp 12 | Ht 62.0 in | Wt 218.1 lb

## 2019-04-07 DIAGNOSIS — I10 Essential (primary) hypertension: Secondary | ICD-10-CM

## 2019-04-07 NOTE — Progress Notes (Signed)
Subjective:     Patient ID: Michelle David, female   DOB: 19-May-1961, 58 y.o.   MRN: [id number]  Michelle David presents for Hypertension (blood pressure check)  Michelle David is a 58 year old female patient of Dr. Moshe Cipro.  Who presents today for follow-up on her hypertension.  Hypertension: She reports that she is taking her medications as a directed.  Denies having any headaches, dizziness, vision changes, chest pain or other signs or symptoms of elevated BP at this time.   Denies side effects of current medications. Denies exercise. Not adherent to low salt diet, as she could be, per her. Does not check BP at home.   Overall today in the office she feels like she is doing okay.  Denies having any fevers, chills, shortness of breath, cough or any other signs or symptoms of COVID or infection.  Denies having any changes in bowel or bladder.  Denies having any changes in memory or sleep.  Past Medical, Surgical, Social History, Allergies, and Medications have been Reviewed.  Past Medical History:  Diagnosis Date  . Cardiomyopathy [facility])    Diagnosed March 2018  . CHF (congestive heart failure) ([place])    a. EF 30-35% by echo in 12/2016 b. EF improved to 45-50% in 07/2018  . Dyspepsia   . Essential hypertension   . Hyperlipidemia   . Impaired glucose tolerance   . Obesity    Past Surgical History:  Procedure Laterality Date  . COLONOSCOPY N/A 03/19/2018   Procedure: COLONOSCOPY;  Surgeon: Danie Binder, MD;  [facility];  Service: Endoscopy;  Laterality: N/A;  2:00  . OOPHORECTOMY    . TUBAL LIGATION     Social History   Socioeconomic History  . Marital status: Married    Spouse name: Not on file  . Number of children: Not on file  . Years of education: Not on file  . Highest education level: Not on file  Occupational History  . 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 Financial resource strain: Not on file  . Food  insecurity    Worry: Not on file    Inability: Not on file  . Transportation needs    Medical: Not on file    Non-medical: Not on file  Tobacco Use  . Smoking status: Never Smoker  . Smokeless tobacco: Never Used  Substance and Sexual Activity  . Alcohol use: Not Currently    Comment: special occasions  . Drug use: No  . Sexual activity: Not Currently  Lifestyle  . Physical activity    Days per week: Not on file    Minutes per session: Not on file  . Stress: Not on file  Relationships  . Social Herbalist on phone: Not on file    Gets together: Not on file    Attends religious service: Not on file    Active member of club or organization: Not on file    Attends meetings of clubs or organizations: Not on file    Relationship status: Not on file  . Intimate partner violence    Fear of current or ex partner: Not on file    Emotionally abused: Not on file    Physically abused: Not on file    Forced sexual activity: Not on file  Other Topics Concern  . Not on file  Social History Narrative  . Not on file    Outpatient  Encounter Medications as of 04/07/2019  Medication Sig  . atorvastatin (LIPITOR) 40 MG tablet Take 1 tablet (40 mg total) by mouth daily.  . carvedilol (COREG) 25 MG tablet Take 0.5 tablets (12.5 mg total) by mouth 2 (two) times daily.  . furosemide (LASIX) 40 MG tablet Take 40 mg daily if you gain more than 2- 3 lbs in 24 hours. For the next 3 days take 20mg  daily  . losartan (COZAAR) 25 MG tablet Take 1 tablet (25 mg total) by mouth daily.   No facility-administered encounter medications on file as of 04/07/2019.    No Known Allergies  Review of Systems  Constitutional: Negative for activity change, appetite change, chills and fever.  HENT: Negative.   Eyes: Negative for visual disturbance.  Respiratory: Negative for cough and shortness of breath.   Cardiovascular: Negative.   Endocrine: Negative.   Genitourinary: Negative.    Musculoskeletal: Negative.   Skin: Negative.   Allergic/Immunologic: Negative.   Neurological: Negative for dizziness and light-headedness.  Hematological: Negative.   Psychiatric/Behavioral: Negative.        Objective:     BP 130/80   Pulse 66   Temp 98.4 F (36.9 C) (Temporal)   Resp 12   Ht 5\' 2"  (1.575 m)   Wt 218 lb 1.3 oz (98.9 kg)   SpO2 98%   BMI 39.89 kg/m   Physical Exam Constitutional:      Appearance: Normal appearance. She is obese.  HENT:     Head: Normocephalic and atraumatic.     Right Ear: External ear normal.     Left Ear: External ear normal.     Nose: Nose normal.  Eyes:     General:        Right eye: No discharge.        Left eye: No discharge.     Conjunctiva/sclera: Conjunctivae normal.  Neck:     Musculoskeletal: Normal range of motion and neck supple.  Cardiovascular:     Rate and Rhythm: Normal rate and regular rhythm.     Pulses: Normal pulses.     Heart sounds: Normal heart sounds.  Pulmonary:     Effort: Pulmonary effort is normal.     Breath sounds: Normal breath sounds.  Musculoskeletal: Normal range of motion.  Neurological:     Mental Status: She is alert and oriented to person, place, and time.  Psychiatric:        Mood and Affect: Mood normal.        Behavior: Behavior normal.        Thought Content: Thought content normal.        Judgment: Judgment normal.        Assessment and Plan        1. HTN (hypertension), malignant Controlled, reports taking medication as directed now. Will continue current meds for now.     Follow Up: Oct for Annual visit   Perlie Mayo, DNP, AGNP-BC [place], [place] [place], [place] [address] Office Hours: Mon-Thurs 8 am-5 pm; Fri 8 am-12 pm Office Phone:  [phone]  Office Fax: [phone]

## 2019-04-07 NOTE — Patient Instructions (Addendum)
    Thank you for coming into the office today. I appreciate the opportunity to provide you with the care for your health and wellness. Today we discussed: bp check  Follow Up: Oct for Annual visit  No labs today.  Ultrasounds is: ___________  Please continue to work on eating a well balanced diet. Stay hydrated with water 6-8 cups daily. Walking 30-60 minutes on at least 5 days of the week.  [place]er YOUR HANDS WELL AND FREQUENTLY. AVOID TOUCHING YOUR FACE, UNLESS YOUR HANDS ARE FRESHLY WASHED.  GET FRESH AIR DAILY. STAY HYDRATED WITH WATER.   It was a pleasure to see you and I look forward to continuing to work together on your health and well-being. Please do not hesitate to call the office if you need care or have questions about your care.  Have a wonderful day and week.  With Gratitude,  Cherly Beach, DNP, AGNP-BC

## 2019-04-12 ENCOUNTER — Other Ambulatory Visit: Payer: Self-pay

## 2019-04-12 ENCOUNTER — Ambulatory Visit (HOSPITAL_COMMUNITY)
Admission: RE | Admit: 2019-04-12 | Discharge: 2019-04-12 | Disposition: A | Discharge status: Home / Self Care | Payer: Self-pay | Source: Ambulatory Visit | Attending: Family Medicine | Admitting: Family Medicine

## 2019-04-12 DIAGNOSIS — E041 Nontoxic single thyroid nodule: Secondary | ICD-10-CM | POA: Insufficient documentation

## 2019-04-12 DIAGNOSIS — R221 Localized swelling, mass and lump, neck: Secondary | ICD-10-CM | POA: Insufficient documentation

## 2019-04-12 NOTE — Progress Notes (Signed)
Nodule has decreased in size. This is good news, most likely benign-no other findings noted. No follow up needed

## 2019-04-26 ENCOUNTER — Other Ambulatory Visit: Payer: Self-pay

## 2019-04-26 ENCOUNTER — Telehealth (INDEPENDENT_AMBULATORY_CARE_PROVIDER_SITE_OTHER): Payer: Self-pay | Admitting: Cardiology

## 2019-04-26 ENCOUNTER — Encounter: Payer: Self-pay | Admitting: Cardiology

## 2019-04-26 VITALS — BP 203/94 | HR 78 | Ht 62.0 in | Wt 218.0 lb

## 2019-04-26 DIAGNOSIS — Z7189 Other specified counseling: Secondary | ICD-10-CM

## 2019-04-26 DIAGNOSIS — E782 Mixed hyperlipidemia: Secondary | ICD-10-CM

## 2019-04-26 DIAGNOSIS — I428 Other cardiomyopathies: Secondary | ICD-10-CM

## 2019-04-26 DIAGNOSIS — I10 Essential (primary) hypertension: Secondary | ICD-10-CM

## 2019-04-26 MED ORDER — LOSARTAN POTASSIUM 50 MG PO TABS: 50.0000 mg | ORAL_TABLET | Freq: Every day | ORAL | 3 refills | Status: DC

## 2019-04-26 NOTE — Patient Instructions (Signed)
Medication Instructions:  INCREASE Cozaar to 50 mg daily  Labwork:  BMET in 1 week   Procedures/Testing: none  Follow-Up: 6 weeks in office with Bernerd Pho PA-C  Any Additional Special Instructions Will Be Listed Below (If Applicable).     If you need a refill on your cardiac medications before your next appointment, please call your pharmacy.      Thank you for choosing [facility] !

## 2019-04-26 NOTE — Progress Notes (Signed)
Virtual Visit via Telephone Note   This visit type was conducted due to national recommendations for restrictions regarding the COVID-19 Pandemic (e.g. social distancing) in an effort to limit this patient's exposure and mitigate transmission in our community.  Due to her co-morbid illnesses, this patient is at least at moderate risk for complications without adequate follow up.  This format is felt to be most appropriate for this patient at this time.  The patient did not have access to video technology/had technical difficulties with video requiring transitioning to audio format only (telephone).  All issues noted in this document were discussed and addressed.  No physical exam could be performed with this format.  Please refer to the patient's chart for her  consent to telehealth for [facility]   Date:  04/26/2019   ID:  Michelle David, Michelle David July 12, 1961, MRN [id number]  Patient Location: Home Provider Location: Office  PCP:  Fayrene Helper, MD  Cardiologist:  Rozann Lesches, MD  Electrophysiologist:  None   Evaluation Performed:  Follow-Up Visit  Chief Complaint:   Cardiac follow-up  History of Present Illness:    Michelle David is a 58 y.o. female last seen by Ms. Strader PA-C in January.  We had difficulty establishing video access today and spoke by phone.  She tells me that over the last 6 months she has had intermittent shortness of breath with activity, mainly housework and chores.  She does not report any chest pain or palpitations.  At the last visit Cozaar was increased to 25 mg daily.  Follow-up lab work showed normal renal function and potassium.  She states that she has been taking her medications regularly.  She had a friend check her vital signs today as noted below, she remains significantly hypertensive.  The patient does not have symptoms concerning for COVID-19 infection (fever, chills, cough, or new shortness of breath).    Past Medical History:   Diagnosis Date  . Cardiomyopathy [facility])    Diagnosed March 2018  . CHF (congestive heart failure) ([place])    a. EF 30-35% by echo in 12/2016 b. EF improved to 45-50% in 07/2018  . Dyspepsia   . Essential hypertension   . Hyperlipidemia   . Impaired glucose tolerance   . Obesity    Past Surgical History:  Procedure Laterality Date  . COLONOSCOPY N/A 03/19/2018   Procedure: COLONOSCOPY;  Surgeon: Danie Binder, MD;  [facility];  Service: Endoscopy;  Laterality: N/A;  2:00  . OOPHORECTOMY    . TUBAL LIGATION       Current Meds  Medication Sig  . atorvastatin (LIPITOR) 40 MG tablet Take 1 tablet (40 mg total) by mouth daily.  . carvedilol (COREG) 25 MG tablet Take 0.5 tablets (12.5 mg total) by mouth 2 (two) times daily.  . furosemide (LASIX) 40 MG tablet Take 40 mg daily if you gain more than 2- 3 lbs in 24 hours. For the next 3 days take 20mg  daily  . losartan (COZAAR) 50 MG tablet Take 1 tablet (50 mg total) by mouth daily.  . [DISCONTINUED] losartan (COZAAR) 25 MG tablet Take 1 tablet (25 mg total) by mouth daily.     Allergies:   Patient has no known allergies.   Social History   Tobacco Use  . Smoking status: Never Smoker  . Smokeless tobacco: Never Used  Substance Use Topics  . Alcohol use: Not Currently    Comment: special occasions  . Drug use: No  Family Hx: The patient's family history includes Heart disease in her mother; Hypertension in her mother; Lung cancer in her maternal aunt.  ROS:   Please see the history of present illness. All other systems reviewed and are negative.   Prior CV studies:   The following studies were reviewed today:  Echocardiogram 08/18/2018: Study Conclusions  - Left ventricle: The cavity size was normal. Wall thickness was   increased in a pattern of moderate LVH. Systolic function was   mildly reduced. The estimated ejection fraction was in the range   of 45% to 50%. Doppler parameters are consistent with  abnormal   left ventricular relaxation (grade 1 diastolic dysfunction). - Aortic valve: Valve area (VTI): 1.86 cm^2. Valve area (Vmax):   2.01 cm^2. Valve area (Vmean): 1.97 cm^2. - Mitral valve: There was mild regurgitation. - Technically difficult study. Echocontrast was used to enhance   visualization.  Labs/Other Tests and Data Reviewed:    EKG:  An ECG dated 09/12/2018 was personally reviewed today and demonstrated:  Sinus rhythm with decreased anteroseptal R wave progression and nonspecific ST changes.  Recent Labs: 08/02/2018: TSH 2.72 09/12/2018: ALT 55; B Natriuretic Peptide 61.0 09/15/2018: Hemoglobin 11.4; Platelets 354 11/18/2018: BUN 14; Creat 0.82; Potassium 3.8; Sodium 139   Recent Lipid Panel Lab Results  Component Value Date/Time   CHOL 165 08/02/2018 04:31 PM   TRIG 84 08/02/2018 04:31 PM   HDL 45 (L) 08/02/2018 04:31 PM   CHOLHDL 3.7 08/02/2018 04:31 PM   LDLCALC 103 (H) 08/02/2018 04:31 PM    Wt Readings from Last 3 Encounters:  04/26/19 218 lb (98.9 kg)  04/07/19 218 lb 1.3 oz (98.9 kg)  03/24/19 218 lb (98.9 kg)     Objective:    Vital Signs:  BP (!) 203/94   Pulse 78   Ht 5\' 2"  (1.575 m)   Wt 218 lb (98.9 kg)   BMI 39.87 kg/m    Patient spoke in full sentences, not short of breath. No audible wheezing or coughing. Speech pattern normal.  ASSESSMENT & PLAN:    1.  Essential hypertension, blood pressure not well controlled.  She reports compliance with current regimen.  Increase Cozaar to 50 mg daily with follow-up BMET in 1 week.  She will likely require further up titration of therapy, perhaps additional medication.  Office follow-up will be arranged in person for her next encounter.  2.  Nonischemic cardiomyopathy, LVEF 45 to 50% range by echocardiogram in October 2019.  We will continue to titrate medical therapy directed at blood pressure control and ultimately repeat an echocardiogram.  3.  Mixed hyperlipidemia, she continues on Lipitor.   Last LDL 103.  She follows with Dr. Moshe Cipro.  COVID-19 Education: The signs and symptoms of COVID-19 were discussed with the patient and how to seek care for testing (follow up with PCP or arrange E-visit).  The importance of social distancing was discussed today.  Time:   Today, I have spent 7 minutes with the patient with telehealth technology discussing the above problems.     Medication Adjustments/Labs and Tests Ordered: Current medicines are reviewed at length with the patient today.  Concerns regarding medicines are outlined above.   Tests Ordered: Orders Placed This Encounter  Procedures  . Basic Metabolic Panel (BMET)    Medication Changes: Meds ordered this encounter  Medications  . losartan (COZAAR) 50 MG tablet    Sig: Take 1 tablet (50 mg total) by mouth daily.    Dispense:  90  tablet    Refill:  3    04/26/19 dose increased to 50 mg qd    Follow Up:  In Person 6 weeks in the [place] office.  Signed, Rozann Lesches, MD  04/26/2019 1:40 PM    [place]

## 2019-04-29 ENCOUNTER — Other Ambulatory Visit (HOSPITAL_COMMUNITY): Payer: Self-pay | Admitting: Respiratory Therapy

## 2019-04-29 DIAGNOSIS — R0602 Shortness of breath: Secondary | ICD-10-CM

## 2019-05-16 ENCOUNTER — Other Ambulatory Visit (HOSPITAL_COMMUNITY)
Admission: RE | Admit: 2019-05-16 | Discharge: 2019-05-16 | Disposition: A | Discharge status: Home / Self Care | Payer: Self-pay | Source: Ambulatory Visit | Attending: Pulmonary Disease | Admitting: Pulmonary Disease

## 2019-05-16 ENCOUNTER — Other Ambulatory Visit: Payer: Self-pay

## 2019-05-18 ENCOUNTER — Other Ambulatory Visit: Payer: Self-pay

## 2019-05-18 ENCOUNTER — Ambulatory Visit (HOSPITAL_COMMUNITY)
Admission: RE | Admit: 2019-05-18 | Discharge: 2019-05-18 | Disposition: A | Discharge status: Home / Self Care | Payer: Self-pay | Source: Ambulatory Visit | Attending: Pulmonary Disease | Admitting: Pulmonary Disease

## 2019-05-18 DIAGNOSIS — R0602 Shortness of breath: Secondary | ICD-10-CM

## 2019-05-18 DIAGNOSIS — Z20822 Contact with and (suspected) exposure to covid-19: Secondary | ICD-10-CM

## 2019-05-20 LAB — NOVEL CORONAVIRUS, NAA: SARS-CoV-2, NAA: NOT DETECTED

## 2019-05-25 ENCOUNTER — Ambulatory Visit (HOSPITAL_COMMUNITY)
Admission: RE | Admit: 2019-05-25 | Discharge: 2019-05-25 | Disposition: A | Discharge status: Home / Self Care | Payer: Self-pay | Source: Ambulatory Visit | Attending: Pulmonary Disease | Admitting: Pulmonary Disease

## 2019-05-25 ENCOUNTER — Other Ambulatory Visit: Payer: Self-pay

## 2019-05-25 DIAGNOSIS — R0602 Shortness of breath: Secondary | ICD-10-CM | POA: Insufficient documentation

## 2019-05-25 LAB — PULMONARY FUNCTION TEST
DL/VA % pred: 11 %
DL/VA: 0.51 ml/min/mmHg/L
DLCO unc % pred: 4 %
DLCO unc: 0.85 ml/min/mmHg
FEF 25-75 Post: 2.93 L/sec
FEF 25-75 Pre: 2.04 L/sec
FEF2575-%Change-Post: 43 %
FEF2575-%Pred-Post: 138 %
FEF2575-%Pred-Pre: 96 %
FEV1-%Change-Post: 7 %
FEV1-%Pred-Post: 80 %
FEV1-%Pred-Pre: 75 %
FEV1-Post: 1.64 L
FEV1-Pre: 1.53 L
FEV1FVC-%Change-Post: 10 %
FEV1FVC-%Pred-Pre: 109 %
FEV6-%Change-Post: -2 %
FEV6-%Pred-Post: 68 %
FEV6-%Pred-Pre: 70 %
FEV6-Post: 1.69 L
FEV6-Pre: 1.74 L
FEV6FVC-%Pred-Post: 103 %
FEV6FVC-%Pred-Pre: 103 %
FVC-%Change-Post: -2 %
FVC-%Pred-Post: 65 %
FVC-%Pred-Pre: 67 %
FVC-Post: 1.69 L
FVC-Pre: 1.74 L
Post FEV1/FVC ratio: 97 %
Post FEV6/FVC ratio: 100 %
Pre FEV1/FVC ratio: 88 %
Pre FEV6/FVC Ratio: 100 %
RV % pred: 73 %
RV: 1.32 L
TLC % pred: 65 %
TLC: 3.09 L

## 2019-05-25 MED ORDER — ALBUTEROL SULFATE (2.5 MG/3ML) 0.083% IN NEBU
2.5000 mg | INHALATION_SOLUTION | Freq: Once | RESPIRATORY_TRACT | Status: AC
  Administered 2019-05-25: 2.5 mg via RESPIRATORY_TRACT

## 2019-06-07 ENCOUNTER — Ambulatory Visit: Payer: Self-pay | Admitting: Student

## 2019-07-11 ENCOUNTER — Other Ambulatory Visit: Payer: Self-pay

## 2019-07-11 ENCOUNTER — Telehealth: Payer: Self-pay | Admitting: *Deleted

## 2019-07-11 DIAGNOSIS — I10 Essential (primary) hypertension: Secondary | ICD-10-CM

## 2019-07-11 MED ORDER — CARVEDILOL 25 MG PO TABS: 12.5000 mg | ORAL_TABLET | Freq: Two times a day (BID) | ORAL | 2 refills | Status: DC

## 2019-07-11 NOTE — Telephone Encounter (Signed)
Spoke with patient and let her know that Coreg has been called in, otherwise, all of her medications have refills on them and she just needs to call them into the pharmacy.

## 2019-07-11 NOTE — Telephone Encounter (Signed)
Pt called said she needed a refill on all of her medications she is about out of all of them.

## 2019-07-15 ENCOUNTER — Encounter: Payer: Self-pay | Admitting: Student

## 2019-07-15 ENCOUNTER — Other Ambulatory Visit: Payer: Self-pay

## 2019-07-15 ENCOUNTER — Ambulatory Visit (INDEPENDENT_AMBULATORY_CARE_PROVIDER_SITE_OTHER): Payer: Self-pay | Admitting: Family Medicine

## 2019-07-15 ENCOUNTER — Ambulatory Visit (INDEPENDENT_AMBULATORY_CARE_PROVIDER_SITE_OTHER): Payer: Self-pay | Admitting: Student

## 2019-07-15 ENCOUNTER — Encounter: Payer: Self-pay | Admitting: Family Medicine

## 2019-07-15 ENCOUNTER — Encounter (INDEPENDENT_AMBULATORY_CARE_PROVIDER_SITE_OTHER): Payer: Self-pay

## 2019-07-15 VITALS — BP 148/70 | HR 74 | Temp 97.9°F | Resp 12 | Ht 62.0 in | Wt 222.0 lb

## 2019-07-15 VITALS — BP 179/82 | HR 64 | Temp 97.1°F | Ht 62.0 in | Wt 225.0 lb

## 2019-07-15 DIAGNOSIS — Z23 Encounter for immunization: Secondary | ICD-10-CM

## 2019-07-15 DIAGNOSIS — I10 Essential (primary) hypertension: Secondary | ICD-10-CM

## 2019-07-15 DIAGNOSIS — E782 Mixed hyperlipidemia: Secondary | ICD-10-CM

## 2019-07-15 DIAGNOSIS — M7989 Other specified soft tissue disorders: Secondary | ICD-10-CM

## 2019-07-15 DIAGNOSIS — Z79899 Other long term (current) drug therapy: Secondary | ICD-10-CM

## 2019-07-15 DIAGNOSIS — M79632 Pain in left forearm: Secondary | ICD-10-CM

## 2019-07-15 DIAGNOSIS — I428 Other cardiomyopathies: Secondary | ICD-10-CM

## 2019-07-15 MED ORDER — ATORVASTATIN CALCIUM 40 MG PO TABS: 40.0000 mg | ORAL_TABLET | Freq: Every day | ORAL | 3 refills | Status: DC

## 2019-07-15 MED ORDER — LOSARTAN POTASSIUM 100 MG PO TABS: 100.0000 mg | ORAL_TABLET | Freq: Every day | ORAL | 3 refills | Status: DC

## 2019-07-15 NOTE — Patient Instructions (Signed)
    I appreciate the opportunity to provide you with the care for your health and wellness. Today we discussed: Forearm pain  Follow up: 08/10/2019 as already scheduled  No labs or referrals today  I think that you have a inner contusion/bruising to the area that you hit about a month ago.  You have good grip, pulses and strength.  So I do not feel this is anything to worry about but you just need more time for it to heal.  You can take Tylenol and ibuprofen in rotation for the next 2 to 3 days.  You can add ice or heat pack to it as well.  If you start to notice the numbness, burning, any sensation change that goes down your arm and into your fingers please notify us or go to your nearest emergency room if we are after hours. If this does not get better in another 3-5 weeks, we can consider an xray.  Please continue to practice social distancing to keep you, your family, and our community safe.  If you must go out, please wear a mask and practice good handwashing.  [place] YOUR HANDS WELL AND FREQUENTLY. AVOID TOUCHING YOUR FACE, UNLESS YOUR HANDS ARE FRESHLY WASHED.  GET FRESH AIR DAILY. STAY HYDRATED WITH WATER.   It was a pleasure to see you and I look forward to continuing to work together on your health and well-being. Please do not hesitate to call the office if you need care or have questions about your care.  Have a wonderful day and week. With Gratitude, Cherly Beach, DNP, AGNP-BC

## 2019-07-15 NOTE — Patient Instructions (Addendum)
Two Gram Sodium Diet 2000 mg  What is Sodium? Sodium is a mineral found naturally in many foods. The most significant source of sodium in the diet is table salt, which is about 40% sodium.  Processed, convenience, and preserved foods also contain a large amount of sodium.  The body needs only 500 mg of sodium daily to function,  A normal diet provides more than enough sodium even if you do not use salt.  Why Limit Sodium? A build up of sodium in the body can cause thirst, increased blood pressure, shortness of breath, and water retention.  Decreasing sodium in the diet can reduce edema and risk of heart attack or stroke associated with high blood pressure.  Keep in mind that there are many other factors involved in these health problems.  Heredity, obesity, lack of exercise, cigarette smoking, stress and what you eat all play a role.  General Guidelines:  Do not add salt at the table or in cooking.  One teaspoon of salt contains over 2 grams of sodium.  Read food labels  Avoid processed and convenience foods  Ask your dietitian before eating any foods not dicussed in the menu planning guidelines  Consult your physician if you wish to use a salt substitute or a sodium containing medication such as antacids.  Limit milk and milk products to 16 oz (2 cups) per day.  Shopping Hints:  READ LABELS!! "Dietetic" does not necessarily mean low sodium.  Salt and other sodium ingredients are often added to foods during processing.   Menu Planning Guidelines Food Group Choose More Often Avoid  Beverages (see also the milk group All fruit juices, low-sodium, salt-free vegetables juices, low-sodium carbonated beverages Regular vegetable or tomato juices, commercially softened water used for drinking or cooking  Breads and Cereals Enriched white, wheat, rye and pumpernickel bread, hard rolls and dinner rolls; muffins, cornbread and waffles; most dry cereals, cooked cereal without added salt; unsalted  crackers and breadsticks; low sodium or homemade bread crumbs Bread, rolls and crackers with salted tops; quick breads; instant hot cereals; pancakes; commercial bread stuffing; self-rising flower and biscuit mixes; regular bread crumbs or cracker crumbs  Desserts and Sweets Desserts and sweets mad with mild should be within allowance Instant pudding mixes and cake mixes  Fats Butter or margarine; vegetable oils; unsalted salad dressings, regular salad dressings limited to 1 Tbs; light, sour and heavy cream Regular salad dressings containing bacon fat, bacon bits, and salt pork; snack dips made with instant soup mixes or processed cheese; salted nuts  Fruits Most fresh, frozen and canned fruits Fruits processed with salt or sodium-containing ingredient (some dried fruits are processed with sodium sulfites        Vegetables Fresh, frozen vegetables and low- sodium canned vegetables Regular canned vegetables, sauerkraut, pickled vegetables, and others prepared in brine; frozen vegetables in sauces; vegetables seasoned with ham, bacon or salt pork  Condiments, Sauces, Miscellaneous  Salt substitute with physician's approval; pepper, herbs, spices; vinegar, lemon or lime juice; hot pepper sauce; garlic powder, onion powder, low sodium soy sauce (1 Tbs.); low sodium condiments (ketchup, chili sauce, mustard) in limited amounts (1 tsp.) fresh ground horseradish; unsalted tortilla chips, pretzels, potato chips, popcorn, salsa (1/4 cup) Any seasoning made with salt including garlic salt, celery salt, onion salt, and seasoned salt; sea salt, rock salt, kosher salt; meat tenderizers; monosodium glutamate; mustard, regular soy sauce, barbecue, sauce, chili sauce, teriyaki sauce, steak sauce, Worcestershire sauce, and most flavored vinegars; canned gravy and mixes;   regular condiments; salted snack foods, olives, picles, relish, horseradish sauce, catsup   Food preparation: Try these seasonings Meats:    Pork  Sage, onion Serve with applesauce  Chicken Poultry seasoning, thyme, parsley Serve with cranberry sauce  Lamb Curry powder, rosemary, garlic, thyme Serve with mint sauce or jelly  Veal Marjoram, basil Serve with current jelly, cranberry sauce  Beef Pepper, bay leaf Serve with dry mustard, unsalted chive butter  Fish [place] leaf, dill Serve with unsalted lemon butter, unsalted parsley butter  Vegetables:    Asparagus Lemon juice   Broccoli Lemon juice   Carrots Mustard dressing parsley, mint, nutmeg, glazed with unsalted butter and sugar   Green beans Marjoram, lemon juice, nutmeg,dill seed   Tomatoes Basil, marjoram, onion   Spice /blend for [facility]" 4 tsp ground thyme 1 tsp ground sage 3 tsp ground rosemary 4 tsp ground marjoram      Medication Instructions: INCREASE losartan to 100 mg daily  Labwork: BMET in 2 weeks  Procedures/Testing: None today  Follow-Up: 2-3 months with Bernerd Pho, PA-C, or Dr.McDowell  Any Additional Special Instructions Will Be Listed Below (If Applicable).     If you need a refill on your cardiac medications before your next appointment, please call your pharmacy.

## 2019-07-15 NOTE — Progress Notes (Signed)
Cardiology Office Note    Date:  07/16/2019   ID:  Michelle, David [birth year], MRN [id number]  PCP:  Michelle Helper, MD  Cardiologist: Michelle Lesches, MD    Chief Complaint  Patient presents with  . Follow-up    2 month visit    History of Present Illness:    Michelle David is a 58 y.o. female with past medical history of presumed nonischemic cardiomyopathy (EF 30 to 35% in 2018, improved to 45 to 50% by echo in 07/2018), HTN, and HLD who presents to the office today for 39-month follow-up.  She had a telehealth visit with Dr. Domenic David in 04/2019 and reported intermittent dyspnea which was mostly occurring with activity but denied any associated chest pain or palpitations. BP had been significantly elevated when checked at home, at 203/94 on most recent check. Losartan was titrated to 50 mg daily with consideration of additional medical therapy at her follow-up visit.  In talking with the patient today, she reports still having dyspnea on exertion which has been unchanged.  Denies any associated chest pain or palpitations. No recent orthopnea, PND, or lower extremity edema.   She does not have a blood pressure cuff at home but BP is elevated at 179/82 during today's visit with similar results of 168/78 on recheck.  She does report consuming a significant amount of sodium as she has 4-5 strips of bacon on a daily basis. She had previously quit consuming soda but is now having 4-5 20 ounce Pepsi sodas daily. Has been trying to reduce use and switch to green tea.    Past Medical History:  Diagnosis Date  . Cardiomyopathy [facility])    Diagnosed March 2018  . CHF (congestive heart failure) ([place])    a. EF 30-35% by echo in 12/2016 b. EF improved to 45-50% in 07/2018  . Dyspepsia   . Essential hypertension   . Hyperlipidemia   . Impaired glucose tolerance   . Obesity     Past Surgical History:  Procedure Laterality Date  . COLONOSCOPY N/A 03/19/2018   Procedure:  COLONOSCOPY;  Surgeon: Danie Binder, MD;  Location: [facility];  Service: Endoscopy;  Laterality: N/A;  2:00  . OOPHORECTOMY    . TUBAL LIGATION      Current Medications: Outpatient Medications Prior to Visit  Medication Sig Dispense Refill  . carvedilol (COREG) 25 MG tablet Take 0.5 tablets (12.5 mg total) by mouth 2 (two) times daily. 30 tablet 2  . furosemide (LASIX) 40 MG tablet Take 40 mg daily if you gain more than 2- 3 lbs in 24 hours. For the next 3 days take 20mg  daily 60 tablet 3  . atorvastatin (LIPITOR) 40 MG tablet Take 1 tablet (40 mg total) by mouth daily. 30 tablet 11  . losartan (COZAAR) 50 MG tablet Take 1 tablet (50 mg total) by mouth daily. 90 tablet 3   No facility-administered medications prior to visit.      Allergies:   Patient has no known allergies.   Social History   Socioeconomic History  . Marital status: Married    Spouse name: Not on file  . Number of children: Not on file  . Years of education: Not on file  . Highest education level: Not on file  Occupational History  . Occupation: Media planner: [employer]  . Financial resource strain: Not on file  . Food insecurity    Worry: Not  on file    Inability: Not on file  . Transportation needs    Medical: Not on file    Non-medical: Not on file  Tobacco Use  . Smoking status: Never Smoker  . Smokeless tobacco: Never Used  Substance and Sexual Activity  . Alcohol use: Not Currently    Comment: special occasions  . Drug use: No  . Sexual activity: Not Currently  Lifestyle  . Physical activity    Days per week: Not on file    Minutes per session: Not on file  . Stress: Not on file  Relationships  . Social Herbalist on phone: Not on file    Gets together: Not on file    Attends religious service: Not on file    Active member of club or organization: Not on file    Attends meetings of clubs or organizations: Not on file    Relationship  status: Not on file  Other Topics Concern  . Not on file  Social History Narrative  . Not on file     Family History:  The patient's family history includes Heart disease in her mother; Hypertension in her mother; Lung cancer in her maternal aunt.   Review of Systems:   Please see the history of present illness.     General:  No chills, fever, night sweats or weight changes.  Cardiovascular:  No chest pain,  edema, orthopnea, palpitations, paroxysmal nocturnal dyspnea. Positive for dyspnea on exertion.  Dermatological: No rash, lesions/masses Respiratory: No cough, dyspnea Urologic: No hematuria, dysuria Abdominal:   No nausea, vomiting, diarrhea, bright red blood per rectum, melena, or hematemesis Neurologic:  No visual changes, wkns, changes in mental status. All other systems reviewed and are otherwise negative except as noted above.   Physical Exam:    VS:  BP (!) 179/82   Pulse 64   Temp (!) 97.1 F (36.2 C)   Ht 5\' 2"  (1.575 m)   Wt 225 lb (102.1 kg)   BMI 41.15 kg/m    General: Well developed, well nourished,female appearing in no acute distress. Head: Normocephalic, atraumatic, sclera non-icteric, no xanthomas, nares are without discharge.  Neck: No carotid bruits. JVD not elevated.  Lungs: Respirations regular and unlabored, without wheezes or rales.  Heart: Regular rate and rhythm. No S3 or S4.  No murmur, no rubs, or gallops appreciated. Abdomen: Soft, non-tender, non-distended with normoactive bowel sounds. No hepatomegaly. No rebound/guarding. No obvious abdominal masses. Msk:  Strength and tone appear normal for age. No joint deformities or effusions. Extremities: No clubbing or cyanosis. No lower extremity edema.  Distal pedal pulses are 2+ bilaterally. Neuro: Alert and oriented X 3. Moves all extremities spontaneously. No focal deficits noted. Psych:  Responds to questions appropriately with a normal affect. Skin: No rashes or lesions noted  Wt Readings  from Last 3 Encounters:  07/15/19 225 lb (102.1 kg)  07/15/19 222 lb 0.6 oz (100.7 kg)  04/26/19 218 lb (98.9 kg)     Studies/Labs Reviewed:   EKG:  EKG is not ordered today.  Recent Labs: 08/02/2018: TSH 2.72 09/12/2018: ALT 55; B Natriuretic Peptide 61.0 09/15/2018: Hemoglobin 11.4; Platelets 354 11/18/2018: BUN 14; Creat 0.82; Potassium 3.8; Sodium 139   Lipid Panel    Component Value Date/Time   CHOL 165 08/02/2018 1631   TRIG 84 08/02/2018 1631   HDL 45 (L) 08/02/2018 1631   CHOLHDL 3.7 08/02/2018 1631   VLDL 26 01/15/2017 1816   LDLCALC 103 (  H) 08/02/2018 1631    Additional studies/ records that were reviewed today include:   Echocardiogram: 07/2018 Study Conclusions  - Left ventricle: The cavity size was normal. Wall thickness was   increased in a pattern of moderate LVH. Systolic function was   mildly reduced. The estimated ejection fraction was in the range   of 45% to 50%. Doppler parameters are consistent with abnormal   left ventricular relaxation (grade 1 diastolic dysfunction). - Aortic valve: Valve area (VTI): 1.86 cm^2. Valve area (Vmax):   2.01 cm^2. Valve area (Vmean): 1.97 cm^2. - Mitral valve: There was mild regurgitation. - Technically difficult study. Echocontrast was used to enhance   visualization.   Assessment:    1. Nonischemic cardiomyopathy ([place])   2. Medication management   3. Essential hypertension   4. Mixed hyperlipidemia      Plan:   In order of problems listed above:  1. Nonischemic Cardiomyopathy - EF 30 to 35% in 2018, improved to 45 to 50% by echo in 07/2018. Possibly secondary to uncontrolled HTN.  - she denies any recent orthopnea, PND, or edema. Has baseline dyspnea on exertion with no acute change in her symptoms. - recommended reducing her sodium intake as outlined above. Continue Lasix at current dosing (patient unsure if she is taking 20mg  daily or 40mg  daily and I asked her to call back to verify her current  dosing upon returning home). Will recheck BMET following dose titration of Losartan.   2. HTN - BP elevated at 179/82 during today's visit with similar results when rechecked.  - currently on Coreg 12.5mg  BID and Losartan 50mg  daily. Will increase Losartan to 100mg  daily and recheck BMET in 2 weeks. If additional BP control needed, would consider the addition of Spironolactone or Chlorthalidone.   3. HLD - followed by PCP. Remains on Atorvastatin 40mg  daily.    Medication Adjustments/Labs and Tests Ordered: Current medicines are reviewed at length with the patient today.  Concerns regarding medicines are outlined above.  Medication changes, Labs and Tests ordered today are listed in the Patient Instructions below. Patient Instructions   Two Gram Sodium Diet 2000 mg  What is Sodium? Sodium is a mineral found naturally in many foods. The most significant source of sodium in the diet is table salt, which is about 40% sodium.  Processed, convenience, and preserved foods also contain a large amount of sodium.  The body needs only 500 mg of sodium daily to function,  A normal diet provides more than enough sodium even if you do not use salt.  Why Limit Sodium? A build up of sodium in the body can cause thirst, increased blood pressure, shortness of breath, and water retention.  Decreasing sodium in the diet can reduce edema and risk of heart attack or stroke associated with high blood pressure.  Keep in mind that there are many other factors involved in these health problems.  Heredity, obesity, lack of exercise, cigarette smoking, stress and what you eat all play a role.  General Guidelines:  Do not add salt at the table or in cooking.  One teaspoon of salt contains over 2 grams of sodium.  Read food labels  Avoid processed and convenience foods  Ask your dietitian before eating any foods not dicussed in the menu planning guidelines  Consult your physician if you wish to use a salt  substitute or a sodium containing medication such as antacids.  Limit milk and milk products to 16 oz (2 cups) per day.  Shopping Hints:  READ LABELS!! "Dietetic" does not necessarily mean low sodium.  Salt and other sodium ingredients are often added to foods during processing.   Menu Planning Guidelines Food Group Choose More Often Avoid  Beverages (see also the milk group All fruit juices, low-sodium, salt-free vegetables juices, low-sodium carbonated beverages Regular vegetable or tomato juices, commercially softened water used for drinking or cooking  Breads and Cereals Enriched white, wheat, rye and pumpernickel bread, hard rolls and dinner rolls; muffins, cornbread and waffles; most dry cereals, cooked cereal without added salt; unsalted crackers and breadsticks; low sodium or homemade bread crumbs Bread, rolls and crackers with salted tops; quick breads; instant hot cereals; pancakes; commercial bread stuffing; self-rising flower and biscuit mixes; regular bread crumbs or cracker crumbs  Desserts and Sweets Desserts and sweets mad with mild should be within allowance Instant pudding mixes and cake mixes  Fats Butter or margarine; vegetable oils; unsalted salad dressings, regular salad dressings limited to 1 Tbs; light, sour and heavy cream Regular salad dressings containing bacon fat, bacon bits, and salt pork; snack dips made with instant soup mixes or processed cheese; salted nuts  Fruits Most fresh, frozen and canned fruits Fruits processed with salt or sodium-containing ingredient (some dried fruits are processed with sodium sulfites        Vegetables Fresh, frozen vegetables and low- sodium canned vegetables Regular canned vegetables, sauerkraut, pickled vegetables, and others prepared in brine; frozen vegetables in sauces; vegetables seasoned with ham, bacon or salt pork  Condiments, Sauces, Miscellaneous  Salt substitute with physician's approval; pepper, herbs, spices;  vinegar, lemon or lime juice; hot pepper sauce; garlic powder, onion powder, low sodium soy sauce (1 Tbs.); low sodium condiments (ketchup, chili sauce, mustard) in limited amounts (1 tsp.) fresh ground horseradish; unsalted tortilla chips, pretzels, potato chips, popcorn, salsa (1/4 cup) Any seasoning made with salt including garlic salt, celery salt, onion salt, and seasoned salt; sea salt, rock salt, kosher salt; meat tenderizers; monosodium glutamate; mustard, regular soy sauce, barbecue, sauce, chili sauce, teriyaki sauce, steak sauce, Worcestershire sauce, and most flavored vinegars; canned gravy and mixes; regular condiments; salted snack foods, olives, picles, relish, horseradish sauce, catsup   Food preparation: Try these seasonings Meats:    Pork Sage, onion Serve with applesauce  Chicken Poultry seasoning, thyme, parsley Serve with cranberry sauce  Lamb Curry powder, rosemary, garlic, thyme Serve with mint sauce or jelly  Veal Marjoram, basil Serve with current jelly, cranberry sauce  Beef Pepper, bay leaf Serve with dry mustard, unsalted chive butter  Fish [place] leaf, dill Serve with unsalted lemon butter, unsalted parsley butter  Vegetables:    Asparagus Lemon juice   Broccoli Lemon juice   Carrots Mustard dressing parsley, mint, nutmeg, glazed with unsalted butter and sugar   Green beans Marjoram, lemon juice, nutmeg,dill seed   Tomatoes Basil, marjoram, onion   Spice /blend for [employer] [facility]" 4 tsp ground thyme 1 tsp ground sage 3 tsp ground rosemary 4 tsp ground marjoram      Medication Instructions: INCREASE losartan to 100 mg daily  Labwork: BMET in 2 weeks  Procedures/Testing: None today  Follow-Up: 2-3 months with Bernerd Pho, PA-C, or Dr.McDowell  Any Additional Special Instructions Will Be Listed Below (If Applicable).     If you need a refill on your cardiac medications before your next appointment, please call your pharmacy.      Signed, Erma Heritage, PA-C  07/16/2019 9:38 AM    [facility]  [facility] [address] [place], [place] [address] Phone: [phone] Fax: [phone]

## 2019-07-15 NOTE — Progress Notes (Signed)
Subjective:     Patient ID: Michelle David, female   DOB: [birth year], 58 y.o.   MRN: [id number]  BROOK ZEINER presents for Arm Pain  Ms. Weatherwax presents today after having left forearm pain.  That began 1 month ago after she ran into a wall in the middle the night trying to go to the restroom.  It does not hurt as bad as it first did.  But it still is uncomfortable especially when she is bending her forearm as she has a little bit of swelling and tenderness still in the anterior cubital area.  She reports sometimes as well numbness/burning sensation sometimes is throbbing.  Has not taking any medication or did any treatments over-the-counter for it.  Nothing really makes it any better or any worse.  But she reports bending does hurt it but is not hurting as bad right now in the office.  Reports severity is a 5 out of 10.  It is intermittent in nature.  Today patient denies signs and symptoms of COVID 19 infection including fever, chills, cough, shortness of breath, and headache.  Past Medical, Surgical, Social History, Allergies, and Medications have been Reviewed.   Past Medical History:  Diagnosis Date  . Cardiomyopathy [facility])    Diagnosed March 2018  . CHF (congestive heart failure) ([place])    a. EF 30-35% by echo in 12/2016 b. EF improved to 45-50% in 07/2018  . Dyspepsia   . Essential hypertension   . Hyperlipidemia   . Impaired glucose tolerance   . Obesity    Past Surgical History:  Procedure Laterality Date  . COLONOSCOPY N/A 03/19/2018   Procedure: COLONOSCOPY;  Surgeon: Danie Binder, MD;  Location: [facility];  Service: Endoscopy;  Laterality: N/A;  2:00  . OOPHORECTOMY    . TUBAL LIGATION     Social History   Socioeconomic History  . Marital status: Married    Spouse name: Not on file  . Number of children: Not on file  . Years of education: Not on file  . Highest education level: Not on file  Occupational History  . Occupation: Runner, broadcasting/film/video:   . Financial resource strain: Not on file  . Food insecurity    Worry: Not on file    Inability: Not on file  . Transportation needs    Medical: Not on file    Non-medical: Not on file  Tobacco Use  . Smoking status: Never Smoker  . Smokeless tobacco: Never Used  Substance and Sexual Activity  . Alcohol use: Not Currently    Comment: special occasions  . Drug use: No  . Sexual activity: Not Currently  Lifestyle  . Physical activity    Days per week: Not on file    Minutes per session: Not on file  . Stress: Not on file  Relationships  . Social Herbalist on phone: Not on file    Gets together: Not on file    Attends religious service: Not on file    Active member of club or organization: Not on file    Attends meetings of clubs or organizations: Not on file    Relationship status: Not on file  . Intimate partner violence    Fear of current or ex partner: Not on file    Emotionally abused: Not on file    Physically abused: Not on file    Forced sexual  activity: Not on file  Other Topics Concern  . Not on file  Social History Narrative  . Not on file    Outpatient Encounter Medications as of 07/15/2019  Medication Sig  . atorvastatin (LIPITOR) 40 MG tablet Take 1 tablet (40 mg total) by mouth daily.  . carvedilol (COREG) 25 MG tablet Take 0.5 tablets (12.5 mg total) by mouth 2 (two) times daily.  . furosemide (LASIX) 40 MG tablet Take 40 mg daily if you gain more than 2- 3 lbs in 24 hours. For the next 3 days take 20mg  daily  . losartan (COZAAR) 50 MG tablet Take 1 tablet (50 mg total) by mouth daily.   No facility-administered encounter medications on file as of 07/15/2019.    No Known Allergies  Review of Systems  Constitutional: Negative for chills and fever.  HENT: Negative.   Eyes: Negative.   Respiratory: Negative.  Negative for cough and shortness of breath.   Cardiovascular: Negative.    Gastrointestinal: Negative.   Endocrine: Negative.   Genitourinary: Negative.   Musculoskeletal: Positive for myalgias.  Skin: Negative.   Allergic/Immunologic: Negative.   Neurological: Negative.   Hematological: Negative.   Psychiatric/Behavioral: Negative.   All other systems reviewed and are negative.      Objective:     BP (!) 148/70   Pulse 74   Temp 97.9 F (36.6 C) (Oral)   Resp 12   Ht 5\' 2"  (1.575 m)   Wt 222 lb 0.6 oz (100.7 kg)   SpO2 98%   BMI 40.61 kg/m   Physical Exam Vitals signs and nursing note reviewed.  Constitutional:      Appearance: Normal appearance. She is obese.  HENT:     Head: Normocephalic and atraumatic.     Right Ear: External ear normal.     Left Ear: External ear normal.     Nose: Nose normal.     Mouth/Throat:     Pharynx: Oropharynx is clear.  Eyes:     General:        Right eye: No discharge.        Left eye: No discharge.     Conjunctiva/sclera: Conjunctivae normal.  Neck:     Musculoskeletal: Normal range of motion and neck supple.  Cardiovascular:     Rate and Rhythm: Normal rate and regular rhythm.     Pulses: Normal pulses.          Radial pulses are 2+ on the right side and 2+ on the left side.     Heart sounds: Normal heart sounds.  Pulmonary:     Effort: Pulmonary effort is normal.     Breath sounds: Normal breath sounds.  Musculoskeletal:       Arms:  Skin:    General: Skin is warm.  Neurological:     General: No focal deficit present.     Mental Status: She is alert and oriented to person, place, and time.     Sensory: Sensation is intact.     Motor: Motor function is intact.     Coordination: Coordination is intact.     Gait: Gait is intact.  Psychiatric:        Mood and Affect: Mood normal.        Behavior: Behavior normal.        Thought Content: Thought content normal.        Judgment: Judgment normal.        Assessment and Plan  1. Pain and swelling of left forearm Educated on rice  measures for the continued pain and swelling of left forearm.  Advised that if it is continue to get better each day that it might take a couple more weeks for it to fully heal depending on how bad that she had the contusion of that arm.  Advised to take Tylenol and Aleve and rotation over the next 2 to 3 days.  Use ice or heat as needed.  Given precautions for when to go to the emergency room or come back to the office.  PE was unremarkable.   2. Need for immunization against influenza Patient was educated on the recommendation for flu vaccine. After obtaining informed consent, the vaccine was administered no adverse effects noted at time of administration. Patient provided with education on arm soreness and use of tylenol or ibuprofen (if safe) for this. Encourage to use the arm vaccine was given in to help reduce the soreness. Patient educated on the signs of a reaction to the vaccine and advised to contact the office should these occur.     - Flu Vaccine QUAD 36+ mos IM   Follow Up: as needed   Perlie Mayo, DNP, AGNP-BC [address] [place], [address] [place], [place] [address] Office Hours: Mon-Thurs 8 am-5 pm; Fri 8 am-12 pm Office Phone:  [phone]  Office Fax: [phone]

## 2019-07-16 ENCOUNTER — Encounter: Payer: Self-pay | Admitting: Student

## 2019-07-18 ENCOUNTER — Telehealth: Payer: Self-pay | Admitting: Licensed Clinical Social Worker

## 2019-07-18 ENCOUNTER — Telehealth: Payer: Self-pay | Admitting: *Deleted

## 2019-07-18 NOTE — Telephone Encounter (Signed)
Error

## 2019-07-18 NOTE — Telephone Encounter (Signed)
Pt called to update med list. She is currently taking Lipitor 40 mg daily. Coreg 25 mg two times daily. Lasix 40 mg daily and Lorsartan 25 mg daily.

## 2019-07-18 NOTE — Telephone Encounter (Signed)
CSW received referral to assist with a BP cuff. CSW unable to order as address in Epic would not go thorough on [place]. CSW attempted to reach patient to verify address although message left for return call. CSW will await return call from patient. Raquel Sarna, [place], [place]

## 2019-07-18 NOTE — Telephone Encounter (Signed)
CSW referred to assist patient with obtaining a BP cuff. CSW contacted patient to inform cuff will be delivered to home. Patient grateful for support and assistance. CSW available as needed. Jackie Analeigha Nauman, LCSW, CCSW-MCS [phone]  

## 2019-07-19 ENCOUNTER — Telehealth: Payer: Self-pay | Admitting: Cardiology

## 2019-07-19 MED ORDER — ATORVASTATIN CALCIUM 40 MG PO TABS: 40.0000 mg | ORAL_TABLET | Freq: Every day | ORAL | 3 refills | Status: DC

## 2019-07-19 MED ORDER — LOSARTAN POTASSIUM 50 MG PO TABS: 50.0000 mg | ORAL_TABLET | Freq: Every day | ORAL | 3 refills | Status: DC

## 2019-07-19 NOTE — Telephone Encounter (Signed)
RETURNING CALL  °

## 2019-07-19 NOTE — Telephone Encounter (Signed)
Returned pt call. Medications reported by pt.

## 2019-07-19 NOTE — Addendum Note (Signed)
Addended by: Levonne Hubert on: 07/19/2019 11:23 AM   Modules accepted: Orders

## 2019-07-19 NOTE — Telephone Encounter (Signed)
Pt reported taking Coreg 25 mg BID and Losartan 25 mg Daily. Pt notified of med changed and voiced understanding

## 2019-07-19 NOTE — Telephone Encounter (Signed)
    She was listed as being on Losartan 50mg  daily at the time of her last visit and this was increased to 100mg  daily. Please confirm if she had been taking 25mg  or 50mg  at that time. If only on 25mg , would titrate to 50mg . If she had been taking 50mg , titrate to 100mg  as outlined at her visit.   Was also listed as taking Coreg 12.5mg  BID but the phone note says 25mg  BID. Please verify this as well and update in her medication list.   Signed, Erma Heritage, PA-C 07/19/2019, 7:51 AM Pager: [phone]

## 2019-07-30 LAB — BASIC METABOLIC PANEL
BUN: 11 mg/dL (ref 7–25)
CO2: 35 mmol/L — ABNORMAL HIGH (ref 20–32)
Calcium: 8.6 mg/dL (ref 8.6–10.4)
Chloride: 104 mmol/L (ref 98–110)
Creat: 0.75 mg/dL (ref 0.50–1.05)
Glucose, Bld: 87 mg/dL (ref 65–99)
Potassium: 4 mmol/L (ref 3.5–5.3)
Sodium: 142 mmol/L (ref 135–146)

## 2019-08-10 ENCOUNTER — Encounter: Payer: Self-pay | Admitting: Family Medicine

## 2019-08-18 ENCOUNTER — Encounter: Payer: Self-pay | Admitting: Family Medicine

## 2019-09-14 ENCOUNTER — Ambulatory Visit (INDEPENDENT_AMBULATORY_CARE_PROVIDER_SITE_OTHER): Payer: Self-pay | Admitting: Family Medicine

## 2019-09-14 ENCOUNTER — Encounter: Payer: Self-pay | Admitting: Student

## 2019-09-14 ENCOUNTER — Other Ambulatory Visit: Payer: Self-pay

## 2019-09-14 ENCOUNTER — Encounter (INDEPENDENT_AMBULATORY_CARE_PROVIDER_SITE_OTHER): Payer: Self-pay

## 2019-09-14 ENCOUNTER — Ambulatory Visit (INDEPENDENT_AMBULATORY_CARE_PROVIDER_SITE_OTHER): Payer: Self-pay | Admitting: Student

## 2019-09-14 ENCOUNTER — Encounter: Payer: Self-pay | Admitting: Family Medicine

## 2019-09-14 VITALS — BP 138/82 | HR 89 | Temp 96.2°F | Ht 62.0 in | Wt 230.0 lb

## 2019-09-14 VITALS — BP 134/84 | HR 82 | Resp 15 | Ht 62.0 in | Wt 230.0 lb

## 2019-09-14 DIAGNOSIS — E785 Hyperlipidemia, unspecified: Secondary | ICD-10-CM

## 2019-09-14 DIAGNOSIS — I5042 Chronic combined systolic (congestive) and diastolic (congestive) heart failure: Secondary | ICD-10-CM

## 2019-09-14 DIAGNOSIS — G8929 Other chronic pain: Secondary | ICD-10-CM

## 2019-09-14 DIAGNOSIS — Z1231 Encounter for screening mammogram for malignant neoplasm of breast: Secondary | ICD-10-CM

## 2019-09-14 DIAGNOSIS — I10 Essential (primary) hypertension: Secondary | ICD-10-CM

## 2019-09-14 DIAGNOSIS — M545 Low back pain, unspecified: Secondary | ICD-10-CM

## 2019-09-14 DIAGNOSIS — I428 Other cardiomyopathies: Secondary | ICD-10-CM

## 2019-09-14 DIAGNOSIS — E782 Mixed hyperlipidemia: Secondary | ICD-10-CM

## 2019-09-14 MED ORDER — FUROSEMIDE 40 MG PO TABS: ORAL_TABLET | ORAL | 3 refills | Status: DC

## 2019-09-14 MED ORDER — FUROSEMIDE 20 MG PO TABS: 20.0000 mg | ORAL_TABLET | Freq: Every day | ORAL | 3 refills | Status: DC

## 2019-09-14 MED ORDER — ROSUVASTATIN CALCIUM 20 MG PO TABS: 20.0000 mg | ORAL_TABLET | Freq: Every day | ORAL | 3 refills | Status: DC

## 2019-09-14 NOTE — Patient Instructions (Signed)
Medication Instructions:  STOP ATORVASTATIN   START CRESTOR 20 MG DAILY   Labwork: NONE  Testing/Procedures: NONE  Follow-Up: Your physician wants you to follow-up in:  6 MONTHS.  You will receive a reminder letter in the mail two months in advance. If you don't receive a letter, please call our office to schedule the follow-up appointment.   Any Other Special Instructions Will Be Listed Below (If Applicable).     If you need a refill on your cardiac medications before your next appointment, please call your pharmacy.

## 2019-09-14 NOTE — Patient Instructions (Addendum)
F/U in office with MD for pap in March 31, 2020, call if you need me sooner  Please schedule mammogram overdue  Please call for lab order  For  June appointment  In April (CBC, fasting lipid, cmp and eGFR, TSH , and Vit D)  Back pain is due  To mild arthritis in your back, weight loss and back strengthening exercises will help chronic back pain

## 2019-09-14 NOTE — Progress Notes (Signed)
Cardiology Office Note    Date:  09/14/2019   ID:  Michelle David, Michelle David April 26, 1961, MRN [id number]  PCP:  Fayrene Helper, MD  Cardiologist: Rozann Lesches, MD    Chief Complaint  Patient presents with  . Follow-up    2 month visit    History of Present Illness:    Michelle David is a 58 y.o. female with past medical history of presumed NICM (EF 30-35% in 2018, at 45-50% by repeat imaging in 2019), HTN and HLD who presents to the office today for 3-month follow-up.  She was last examined by myself in 06/2019 and reported having dyspnea on exertion at baseline but denied any associated chest pain, palpitations or edema.  She was consuming a high sodium diet but had quit consuming sodas.  She had not titrated losartan as previously instructed and was taking Coreg 25mg  BID and Losartan 25mg  daily.  It was recommended that she increase Losartan to 50 mg daily and have a repeat BMET in 2 weeks. Follow-up labs showed stable creatinine of 0.75 with K+ at 4.0.  In talking with the patient today, she reports overall doing well since her last visit.  She has been checking her blood pressure at home and says her SBP is overall been in the 120's to 140's.  BP was initially elevated at 157/87 during today's visit, improved to 138/82 on recheck.  She reports having not yet taken her AM medications.  She has chronic dyspnea on exertion but denies any recent chest pain, palpitations, orthopnea, or PND.  She does take Lasix 20 mg daily and says her edema has overall been stable.  She does not have insurance coverage at this time and reports her medications were over $80 when filled most recently. She asks for an alternative to Atorvastatin as this was most expensive.  Past Medical History:  Diagnosis Date  . Cardiomyopathy [facility])    Diagnosed March 2018  . CHF (congestive heart failure) ([facility])    a. EF 30-35% by echo in 12/2016 b. EF improved to 45-50% in 07/2018  . Dyspepsia   .  Essential hypertension   . Hyperlipidemia   . Impaired glucose tolerance   . Obesity     Past Surgical History:  Procedure Laterality Date  . COLONOSCOPY N/A 03/19/2018   Procedure: COLONOSCOPY;  Surgeon: Danie Binder, MD;  Location: [facility];  Service: Endoscopy;  Laterality: N/A;  2:00  . OOPHORECTOMY    . TUBAL LIGATION      Current Medications: Outpatient Medications Prior to Visit  Medication Sig Dispense Refill  . carvedilol (COREG) 25 MG tablet Take 25 mg by mouth 2 (two) times daily with a meal.    . furosemide (LASIX) 20 MG tablet Take 1 tablet (20 mg total) by mouth daily. 90 tablet 3  . losartan (COZAAR) 50 MG tablet Take 1 tablet (50 mg total) by mouth daily. 90 tablet 3  . atorvastatin (LIPITOR) 40 MG tablet Take 1 tablet (40 mg total) by mouth daily. 90 tablet 3   No facility-administered medications prior to visit.      Allergies:   Patient has no known allergies.   Social History   Socioeconomic History  . Marital status: Married    Spouse name: Not on file  . Number of children: Not on file  . Years of education: Not on file  . Highest education level: Not on file  Occupational History  . Occupation: [employer]  Employer: [employer]  Social Needs  . Financial resource strain: Not on file  . Food insecurity    Worry: Not on file    Inability: Not on file  . Transportation needs    Medical: Not on file    Non-medical: Not on file  Tobacco Use  . Smoking status: Never Smoker  . Smokeless tobacco: Never Used  Substance and Sexual Activity  . Alcohol use: Not Currently    Comment: special occasions  . Drug use: No  . Sexual activity: Not Currently  Lifestyle  . Physical activity    Days per week: Not on file    Minutes per session: Not on file  . Stress: Not on file  Relationships  . Social Herbalist on phone: Not on file    Gets together: Not on file    Attends religious service: Not on file    Active member  of club or organization: Not on file    Attends meetings of clubs or organizations: Not on file    Relationship status: Not on file  Other Topics Concern  . Not on file  Social History Narrative  . Not on file     Family History:  The patient's family history includes Heart disease in her mother; Hypertension in her mother; Lung cancer in her maternal aunt.   Review of Systems:   Please see the history of present illness.     General:  No chills, fever, night sweats or weight changes.  Cardiovascular:  No chest pain, edema, orthopnea, palpitations, paroxysmal nocturnal dyspnea. Positive for dyspnea on exertion (unchanged).  Dermatological: No rash, lesions/masses Respiratory: No cough, dyspnea Urologic: No hematuria, dysuria Abdominal:   No nausea, vomiting, diarrhea, bright red blood per rectum, melena, or hematemesis Neurologic:  No visual changes, wkns, changes in mental status.  All other systems reviewed and are otherwise negative except as noted above.   Physical Exam:    VS:  BP 138/82   Pulse 89   Temp (!) 96.2 F (35.7 C)   Ht 5\' 2"  (1.575 m)   Wt 230 lb (104.3 kg)   SpO2 95%   BMI 42.07 kg/m    General: Well developed, well nourished,female appearing in no acute distress. Head: Normocephalic, atraumatic, sclera non-icteric, no xanthomas, nares are without discharge.  Neck: No carotid bruits. JVD not elevated.  Lungs: Respirations regular and unlabored, without wheezes or rales.  Heart: Regular rate and rhythm. No S3 or S4.  No murmur, no rubs, or gallops appreciated. Abdomen: Soft, non-tender, non-distended with normoactive bowel sounds. No hepatomegaly. No rebound/guarding. No obvious abdominal masses. Msk:  Strength and tone appear normal for age. No joint deformities or effusions. Extremities: No clubbing or cyanosis. No lower extremity edema.  Distal pedal pulses are 2+ bilaterally. Neuro: Alert and oriented X 3. Moves all extremities spontaneously. No focal  deficits noted. Psych:  Responds to questions appropriately with a normal affect. Skin: No rashes or lesions noted  Wt Readings from Last 3 Encounters:  09/14/19 230 lb (104.3 kg)  09/14/19 230 lb (104.3 kg)  07/15/19 225 lb (102.1 kg)     Studies/Labs Reviewed:   EKG:  EKG is not ordered today.    Recent Labs: 09/15/2018: Hemoglobin 11.4; Platelets 354 07/29/2019: BUN 11; Creat 0.75; Potassium 4.0; Sodium 142   Lipid Panel    Component Value Date/Time   CHOL 165 08/02/2018 1631   TRIG 84 08/02/2018 1631   HDL 45 (L)  08/02/2018 1631   CHOLHDL 3.7 08/02/2018 1631   VLDL 26 01/15/2017 1816   LDLCALC 103 (H) 08/02/2018 1631    Additional studies/ records that were reviewed today include:   Echocardiogram: 07/2018 Study Conclusions  - Left ventricle: The cavity size was normal. Wall thickness was   increased in a pattern of moderate LVH. Systolic function was   mildly reduced. The estimated ejection fraction was in the range   of 45% to 50%. Doppler parameters are consistent with abnormal   left ventricular relaxation (grade 1 diastolic dysfunction). - Aortic valve: Valve area (VTI): 1.86 cm^2. Valve area (Vmax):   2.01 cm^2. Valve area (Vmean): 1.97 cm^2. - Mitral valve: There was mild regurgitation. - Technically difficult study. Echocontrast was used to enhance   visualization.   Assessment:    1. Non-ischemic cardiomyopathy ([place])   2. Essential hypertension   3. Mixed hyperlipidemia      Plan:   In order of problems listed above:  1. Nonischemic Cardiomyopathy - EF previously at 30-35% in 2018, at 45-50% by repeat imaging in 2019. She denies any recent orthopnea, PND or lower extremity edema.  Appears euvolemic by examination today. Continue Lasix 20 mg daily along with beta-blocker and ARB at current dosing.   2. HTN - BP has overall improved following recent medication changes. At 138/82 during today's visit and she has not yet taken her AM  medications. Continue current regimen with Coreg 25mg  BID and Losartan 50mg  daily. I encouraged her to continue to follow readings at home.   3. HLD - Atorvastatin has been expensive for the patient given no insurance coverage. By review of GoodRx, a 90-day supply of Crestor would be less than $20 which is affordable for the patient. Will stop Atorvastatin 40mg  daily and switch to Crestor 20mg  daily.   Medication Adjustments/Labs and Tests Ordered: Current medicines are reviewed at length with the patient today.  Concerns regarding medicines are outlined above.  Medication changes, Labs and Tests ordered today are listed in the Patient Instructions below. Patient Instructions  Medication Instructions:  STOP ATORVASTATIN   START CRESTOR 20 MG DAILY   Labwork: NONE  Testing/Procedures: NONE  Follow-Up: Your physician wants you to follow-up in:  6 MONTHS.  You will receive a reminder letter in the mail two months in advance. If you don't receive a letter, please call our office to schedule the follow-up appointment.   Any Other Special Instructions Will Be Listed Below (If Applicable).   If you need a refill on your cardiac medications before your next appointment, please call your pharmacy.    Signed, Erma Heritage, PA-C  09/14/2019 5:54 PM    [facility] [address] [place], [place] [address] Phone: [phone] Fax: [phone]

## 2019-09-14 NOTE — Progress Notes (Signed)
Michelle David     MRN: [id number]      DOB: 07-01-1961   HPI Michelle David is here for follow up and re-evaluation of chronic medical conditions, medication management and review of any available recent lab and radiology data.  Preventive health is updated, specifically  Cancer screening and Immunization.   Questions or concerns regarding consultations or procedures which the PT has had in the interim are  addressed. The PT denies any adverse reactions to current medications since the last visit.  Intermittent low back pain x 6 months, rated at a 2 , bending and cold qweather aggravate. No fever or chills, no urinary symptoms Intermittent numbness in both feet  ROS Denies recent fever or chills. Denies sinus pressure, nasal congestion, ear pain or sore throat. Denies chest congestion, productive cough or wheezing. Denies chest pains, palpitations and leg swelling Denies abdominal pain, nausea, vomiting,diarrhea or constipation.   Denies dysuria, frequency, hesitancy or incontinence.. Denies headaches, seizures, numbness, or tingling. Denies depression, anxiety or insomnia. Denies skin break down or rash.   PE BP 134/84   Pulse 82   Resp 15   Ht 5\' 2"  (1.575 m)   Wt 230 lb (104.3 kg)   SpO2 99%   BMI 42.07 kg/m    Patient alert and oriented and in no cardiopulmonary distress.  HEENT: No facial asymmetry, EOMI,     Neck supple .  Chest: Clear to auscultation bilaterally.  CVS: S1, S2 no murmurs, no S3.Regular rate.  ABD: Soft non tender.   Ext: No edema  MS: Adequate ROM spine, shoulders, hips and knees.  Skin: Intact, no ulcerations or rash noted.  Psych: Good eye contact, normal affect. Memory intact not anxious or depressed appearing.  CNS: CN 2-12 intact, power,  normal throughout.no focal deficits noted.   Assessment & Plan  HTN (hypertension), malignant Controlled, no change in medication DASH diet and commitment to daily physical activity for a  minimum of 30 minutes discussed and encouraged, as a part of hypertension management. The importance of attaining a healthy weight is also discussed.  BP/Weight 09/14/2019 09/14/2019 07/15/2019 07/15/2019 04/26/2019 [id number]  Systolic BP Q000111Q 0000000 123456 0000000 [id number]  Diastolic BP 84 82 70 82 94 80 98  Wt. (Lbs) 230 230 222.04 225 218 218.08 218  BMI 42.07 42.07 40.61 41.15 39.87 39.89 39.87       Morbid obesity  Patient re-educated about  the importance of commitment to a  minimum of 150 minutes of exercise per week as able.  The importance of healthy food choices with portion control discussed, as well as eating regularly and within a 12 hour window most days. The need to choose "clean , green" food 50 to 75% of the time is discussed, as well as to make water the primary drink and set a goal of 64 ounces water daily.    Weight /BMI 09/14/2019 09/14/2019 07/15/2019  WEIGHT 230 lb 230 lb 222 lb 0.6 oz  HEIGHT 5\' 2"  5\' 2"  5\' 2"   BMI 42.07 kg/m2 42.07 kg/m2 40.61 kg/m2      Hyperlipidemia LDL goal <100 Hyperlipidemia:Low fat diet discussed and encouraged.   Lipid Panel  Lab Results  Component Value Date   CHOL 165 08/02/2018   HDL 45 (L) 08/02/2018   LDLCALC 103 (H) 08/02/2018   TRIG 84 08/02/2018   CHOLHDL 3.7 08/02/2018     Updated lab needed at/ before next visit.   Chronic combined systolic and diastolic  CHF (congestive heart failure) (HCC) Stable, no signs or symptoms of decompensation currently Followed by cardiology  Back pain Mild arthritis , weight loss and back exercises recommended

## 2019-09-18 ENCOUNTER — Encounter: Payer: Self-pay | Admitting: Family Medicine

## 2019-09-18 NOTE — Assessment & Plan Note (Signed)
  Patient re-educated about  the importance of commitment to a  minimum of 150 minutes of exercise per week as able.  The importance of healthy food choices with portion control discussed, as well as eating regularly and within a 12 hour window most days. The need to choose "clean , green" food 50 to 75% of the time is discussed, as well as to make water the primary drink and set a goal of 64 ounces water daily.    Weight /BMI 09/14/2019 09/14/2019 07/15/2019  WEIGHT 230 lb 230 lb 222 lb 0.6 oz  HEIGHT 5\' 2"  5\' 2"  5\' 2"   BMI 42.07 kg/m2 42.07 kg/m2 40.61 kg/m2

## 2019-09-18 NOTE — Assessment & Plan Note (Signed)
Controlled, no change in medication DASH diet and commitment to daily physical activity for a minimum of 30 minutes discussed and encouraged, as a part of hypertension management. The importance of attaining a healthy weight is also discussed.  BP/Weight 09/14/2019 09/14/2019 07/15/2019 07/15/2019 04/26/2019 [id number]  Systolic BP Q000111Q 0000000 123456 0000000 123456 [id number]  Diastolic BP 84 82 70 82 94 80 98  Wt. (Lbs) 230 230 222.04 225 218 218.08 218  BMI 42.07 42.07 40.61 41.15 39.87 39.89 39.87

## 2019-09-18 NOTE — Assessment & Plan Note (Signed)
Hyperlipidemia:Low fat diet discussed and encouraged.   Lipid Panel  Lab Results  Component Value Date   CHOL 165 08/02/2018   HDL 45 (L) 08/02/2018   LDLCALC 103 (H) 08/02/2018   TRIG 84 08/02/2018   CHOLHDL 3.7 08/02/2018     Updated lab needed at/ before next visit.

## 2019-09-18 NOTE — Assessment & Plan Note (Signed)
Stable, no signs or symptoms of decompensation currently Followed by cardiology

## 2019-09-18 NOTE — Assessment & Plan Note (Signed)
Mild arthritis , weight loss and back exercises recommended

## 2019-09-29 ENCOUNTER — Encounter: Payer: Self-pay | Admitting: Family Medicine

## 2019-11-07 ENCOUNTER — Ambulatory Visit (HOSPITAL_COMMUNITY): Payer: Self-pay

## 2019-11-14 ENCOUNTER — Encounter: Payer: Self-pay | Admitting: Family Medicine

## 2019-11-14 ENCOUNTER — Ambulatory Visit (INDEPENDENT_AMBULATORY_CARE_PROVIDER_SITE_OTHER): Payer: Self-pay | Admitting: Family Medicine

## 2019-11-14 ENCOUNTER — Other Ambulatory Visit: Payer: Self-pay

## 2019-11-14 VITALS — BP 138/82 | HR 84 | Temp 97.1°F | Resp 15 | Ht 62.0 in | Wt 236.0 lb

## 2019-11-14 DIAGNOSIS — M170 Bilateral primary osteoarthritis of knee: Secondary | ICD-10-CM

## 2019-11-14 DIAGNOSIS — K089 Disorder of teeth and supporting structures, unspecified: Secondary | ICD-10-CM

## 2019-11-14 DIAGNOSIS — I10 Essential (primary) hypertension: Secondary | ICD-10-CM

## 2019-11-14 DIAGNOSIS — I5042 Chronic combined systolic (congestive) and diastolic (congestive) heart failure: Secondary | ICD-10-CM

## 2019-11-14 DIAGNOSIS — E785 Hyperlipidemia, unspecified: Secondary | ICD-10-CM

## 2019-11-14 MED ORDER — LOSARTAN POTASSIUM 50 MG PO TABS: 50.0000 mg | ORAL_TABLET | Freq: Every day | ORAL | 3 refills | Status: DC

## 2019-11-14 MED ORDER — CARVEDILOL 25 MG PO TABS: 25.0000 mg | ORAL_TABLET | Freq: Two times a day (BID) | ORAL | 3 refills | Status: DC

## 2019-11-14 MED ORDER — ROSUVASTATIN CALCIUM 20 MG PO TABS: 20.0000 mg | ORAL_TABLET | Freq: Every day | ORAL | 3 refills | Status: DC

## 2019-11-14 MED ORDER — FUROSEMIDE 20 MG PO TABS: 20.0000 mg | ORAL_TABLET | Freq: Every day | ORAL | 3 refills | Status: DC

## 2019-11-14 NOTE — Progress Notes (Signed)
Michelle David     MRN: [id number]      DOB: 1960/11/15   HPI Michelle David is here for follow up and re-evaluation of chronic medical conditions, medication management and review of any available recent lab and radiology data.  Preventive health is updated, specifically  Cancer screening and Immunization.   Questions or concerns regarding consultations or procedures which the PT has had in the interim are  addressed. The PT denies any adverse reactions to current medications since the last visit.  1 month h/o bilateral knee pain, she has doubled lasix as she thinks this will help. Very occasional buckling , no falls, no recent knee trauma  ROS Denies recent fever or chills. Denies sinus pressure, nasal congestion, ear pain or sore throat. Denies chest congestion, productive cough or wheezing. Denies chest pains, palpitations and leg swelling Denies abdominal pain, nausea, vomiting,diarrhea or constipation.   Denies dysuria, frequency, hesitancy or incontinence.  Denies headaches, seizures, numbness, or tingling. Denies depression, anxiety or insomnia. Denies skin break down or rash.   PE  BP (!) 160/86   Pulse 84   Temp (!) 97.1 F (36.2 C) (Temporal)   Resp 15   Ht 5\' 2"  (1.575 m)   Wt 236 lb 0.6 oz (107.1 kg)   SpO2 97%   BMI 43.17 kg/m   Patient alert and oriented and in no cardiopulmonary distress.  HEENT: No facial asymmetry, EOMI,     Neck supple .  Chest: Clear to auscultation bilaterally.  CVS: S1, S2 no murmurs, no S3.Regular rate.  ABD: Soft non tender.   Ext: No edema  MS: Adequate ROM spine, shoulders, hips and reduced in  knees.  Skin: Intact, no ulcerations or rash noted.  Psych: Good eye contact, normal affect. Memory intact not anxious or depressed appearing.  CNS: CN 2-12 intact, power,  normal throughout.no focal deficits noted.   Assessment & Plan  HTN (hypertension), malignant Uncontrolled, doe not have all medications prescribed,  will collect and  Commit to taking as prescribed DASH diet and commitment to daily physical activity for a minimum of 30 minutes discussed and encouraged, as a part of hypertension management. The importance of attaining a healthy weight is also discussed.  BP/Weight 11/14/2019 09/14/2019 09/14/2019 07/15/2019 07/15/2019 04/26/2019 99991111  Systolic BP 0000000 [id number] 0000000 123456 0000000 [id number]  Diastolic BP 82 84 82 70 82 94 80  Wt. (Lbs) 236.04 230 230 222.04 225 218 218.08  BMI 43.17 42.07 42.07 40.61 41.15 39.87 39.89       Chronic combined systolic and diastolic CHF (congestive heart failure) (HCC) Stable, no s/s of decompensation. Importance of fluid restriction an also sodium restriction are stressed  Osteoarthritis of both knees Worsening with time and ongoing obesity, weight loss and tylenol  Poor dentition Unchanged due to financial reasons  Morbid obesity  Patient re-educated about  the importance of commitment to a  minimum of 150 minutes of exercise per week as able.  The importance of healthy food choices with portion control discussed, as well as eating regularly and within a 12 hour window most days. The need to choose "clean , green" food 50 to 75% of the time is discussed, as well as to make water the primary drink and set a goal of 64 ounces water daily.    Weight /BMI 11/14/2019 09/14/2019 09/14/2019  WEIGHT 236 lb 0.6 oz 230 lb 230 lb  HEIGHT 5\' 2"  5\' 2"  5\' 2"   BMI 43.17 kg/m2 42.07 kg/m2  42.07 kg/m2      Hyperlipidemia LDL goal <100 Hyperlipidemia:Low fat diet discussed and encouraged.   Lipid Panel  Lab Results  Component Value Date   CHOL 150 11/14/2019   HDL 40 (L) 11/14/2019   LDLCALC 91 11/14/2019   TRIG 95 11/14/2019   CHOLHDL 3.8 11/14/2019   Controlled, no change in medication Needs to increase exercise

## 2019-11-14 NOTE — Patient Instructions (Addendum)
F/U in office with MD in 5 months, call if you ned me before  Please schedule mammogram at checkout  All 4 medications are prescribed  Labs today, lipid,cmp and EGFr, CBC  Tylenol arthritis/ or tylenol extra strength, one tablet twice daily may be taken for arthritis, no other OTC m medication for pain is safe for you,  (samples today from the office)    .

## 2019-11-15 LAB — CBC
HCT: 37.3 % (ref 35.0–45.0)
Hemoglobin: 12.2 g/dL (ref 11.7–15.5)
MCH: 30.2 pg (ref 27.0–33.0)
MCHC: 32.7 g/dL (ref 32.0–36.0)
MCV: 92.3 fL (ref 80.0–100.0)
MPV: 9.2 fL (ref 7.5–12.5)
Platelets: 359 10*3/uL (ref 140–400)
RBC: 4.04 10*6/uL (ref 3.80–5.10)
RDW: 13.2 % (ref 11.0–15.0)
WBC: 5.2 10*3/uL (ref 3.8–10.8)

## 2019-11-15 LAB — COMPLETE METABOLIC PANEL WITH GFR
AG Ratio: 0.8 (calc) — ABNORMAL LOW (ref 1.0–2.5)
ALT: 12 U/L (ref 6–29)
AST: 13 U/L (ref 10–35)
Albumin: 3.5 g/dL — ABNORMAL LOW (ref 3.6–5.1)
Alkaline phosphatase (APISO): 70 U/L (ref 37–153)
BUN: 12 mg/dL (ref 7–25)
CO2: 32 mmol/L (ref 20–32)
Calcium: 8.8 mg/dL (ref 8.6–10.4)
Chloride: 105 mmol/L (ref 98–110)
Creat: 0.83 mg/dL (ref 0.50–1.05)
GFR, Est African American: 91 mL/min/{1.73_m2} (ref >=60)
GFR, Est Non African American: 79 mL/min/{1.73_m2} (ref >=60)
Globulin: 4.4 g/dL (calc) — ABNORMAL HIGH (ref 1.9–3.7)
Glucose, Bld: 60 mg/dL — ABNORMAL LOW (ref 65–99)
Potassium: 3.6 mmol/L (ref 3.5–5.3)
Sodium: 143 mmol/L (ref 135–146)
Total Bilirubin: 0.3 mg/dL (ref 0.2–1.2)
Total Protein: 7.9 g/dL (ref 6.1–8.1)

## 2019-11-15 LAB — LIPID PANEL
Cholesterol: 150 mg/dL (ref <200)
HDL: 40 mg/dL — ABNORMAL LOW (ref >=50)
LDL Cholesterol (Calc): 91 mg/dL (calc)
Non-HDL Cholesterol (Calc): 110 mg/dL (calc) (ref <130)
Total CHOL/HDL Ratio: 3.8 (calc) (ref <5.0)
Triglycerides: 95 mg/dL (ref <150)

## 2019-11-17 ENCOUNTER — Ambulatory Visit (HOSPITAL_COMMUNITY): Payer: Self-pay

## 2019-11-20 ENCOUNTER — Encounter: Payer: Self-pay | Admitting: Family Medicine

## 2019-11-20 NOTE — Assessment & Plan Note (Signed)
Hyperlipidemia:Low fat diet discussed and encouraged.   Lipid Panel  Lab Results  Component Value Date   CHOL 150 11/14/2019   HDL 40 (L) 11/14/2019   LDLCALC 91 11/14/2019   TRIG 95 11/14/2019   CHOLHDL 3.8 11/14/2019   Controlled, no change in medication Needs to increase exercise

## 2019-11-20 NOTE — Assessment & Plan Note (Signed)
Stable, no s/s of decompensation. Importance of fluid restriction an also sodium restriction are stressed

## 2019-11-20 NOTE — Assessment & Plan Note (Signed)
Uncontrolled, doe not have all medications prescribed, will collect and  Commit to taking as prescribed DASH diet and commitment to daily physical activity for a minimum of 30 minutes discussed and encouraged, as a part of hypertension management. The importance of attaining a healthy weight is also discussed.  BP/Weight 11/14/2019 09/14/2019 09/14/2019 07/15/2019 07/15/2019 04/26/2019 [id number]  Systolic BP 0000000 Q000111Q 0000000 123456 0000000 [id number]  Diastolic BP 82 84 82 70 82 94 80  Wt. (Lbs) 236.04 230 230 222.04 225 218 218.08  BMI 43.17 42.07 42.07 40.61 41.15 39.87 39.89

## 2019-11-20 NOTE — Assessment & Plan Note (Signed)
Unchanged due to financial reasons

## 2019-11-20 NOTE — Assessment & Plan Note (Signed)
Worsening with time and ongoing obesity, weight loss and tylenol

## 2019-11-20 NOTE — Assessment & Plan Note (Signed)
  Patient re-educated about  the importance of commitment to a  minimum of 150 minutes of exercise per week as able.  The importance of healthy food choices with portion control discussed, as well as eating regularly and within a 12 hour window most days. The need to choose "clean , green" food 50 to 75% of the time is discussed, as well as to make water the primary drink and set a goal of 64 ounces water daily.    Weight /BMI 11/14/2019 09/14/2019 09/14/2019  WEIGHT 236 lb 0.6 oz 230 lb 230 lb  HEIGHT 5\' 2"  5\' 2"  5\' 2"   BMI 43.17 kg/m2 42.07 kg/m2 42.07 kg/m2

## 2020-01-03 ENCOUNTER — Ambulatory Visit: Payer: Self-pay | Admitting: Family Medicine

## 2020-01-13 ENCOUNTER — Ambulatory Visit: Payer: Self-pay | Admitting: Family Medicine

## 2020-01-19 ENCOUNTER — Other Ambulatory Visit: Payer: Self-pay

## 2020-01-19 ENCOUNTER — Encounter: Payer: Self-pay | Admitting: Family Medicine

## 2020-01-19 ENCOUNTER — Ambulatory Visit (INDEPENDENT_AMBULATORY_CARE_PROVIDER_SITE_OTHER): Payer: Self-pay | Admitting: Family Medicine

## 2020-01-19 VITALS — BP 144/90 | HR 84 | Temp 97.7°F | Ht 62.0 in | Wt 234.8 lb

## 2020-01-19 DIAGNOSIS — M170 Bilateral primary osteoarthritis of knee: Secondary | ICD-10-CM

## 2020-01-19 MED ORDER — METHYLPREDNISOLONE ACETATE 80 MG/ML IJ SUSP
80.0000 mg | Freq: Once | INTRAMUSCULAR | Status: AC
  Administered 2020-01-19: 80 mg via INTRAMUSCULAR

## 2020-01-19 MED ORDER — KETOROLAC TROMETHAMINE 60 MG/2ML IM SOLN
60.0000 mg | Freq: Once | INTRAMUSCULAR | Status: AC
  Administered 2020-01-19: 60 mg via INTRAMUSCULAR

## 2020-01-19 MED ORDER — MELOXICAM 7.5 MG PO TABS: 7.5000 mg | ORAL_TABLET | Freq: Every day | ORAL | 0 refills | Status: AC

## 2020-01-19 NOTE — Patient Instructions (Addendum)
I appreciate the opportunity to provide you with care for your health and wellness. Today we discussed: knee pain  Follow up: 04/03/2020 as scheduled  No labs or referrals today  Injections today for pain  Use Mobic daily for 1 week. If not better after that then we will need to look at referral to Ortho dr.  Weight loss if the biggest helper of reducing joint pain.  Please continue to practice social distancing to keep you, your family, and our community safe.  If you must go out, please wear a mask and practice good handwashing.  It was a pleasure to see you and I look forward to continuing to work together on your health and well-being. Please do not hesitate to call the office if you need care or have questions about your care.  Have a wonderful day and week. With Gratitude, Cherly Beach, DNP, AGNP-BC

## 2020-01-19 NOTE — Progress Notes (Signed)
Subjective:  Patient ID: Michelle David, female    DOB: October 14, 1961  Age: 59 y.o. MRN: [id number]  CC:  Chief Complaint  Patient presents with  . Knee Pain    bilateral knee pai since its been cold outside       HPI  HPI Michelle David is a 59 year old female patient who presents today for bilateral knee pain especially since the weather was colder.  Reports that she had trouble off and on over the winter.  Previous history demonstrates osteoarthritis of both knees and her problem list.  No noted x-ray in chart for follow-up for review of this. Has not been seen by orthopedist provider for this.  Reports that she at one point had to use her husband's walker as she was having such difficulty with mobility.  She has put on 4 to 5 pounds in the last couple months but reports that it is nothing outside of her normal up-and-down weight.  Both knees are achy.  Reports sometimes a stick.  Sometimes a stabbing pain.  She reports that it comes a lot when it is raining or cold.  She reports sitting makes it ache and when walking it can be sharp and stabbing and pain.  Laying down and resting is the only thing that helps her.  She has a constant ache at rest no matter what though.  She reports her pain is a 6 7 out of 10.  Is open to injections today.  Starting of an anti-inflammatory and possible referral if this does not help her work.  Today patient denies signs and symptoms of COVID 19 infection including fever, chills, cough, shortness of breath, and headache. Past Medical, Surgical, Social History, Allergies, and Medications have been Reviewed.   Past Medical History:  Diagnosis Date  . Cardiomyopathy [facility])    Diagnosed March 2018  . CHF (congestive heart failure) ([place])    a. EF 30-35% by echo in 12/2016 b. EF improved to 45-50% in 07/2018  . Dyspepsia   . Essential hypertension   . Hyperlipidemia   . Hypoxia 11/01/2018  . Impaired glucose tolerance   . Multifocal pneumonia 11/01/2018   . Obesity   . Snoring 01/22/2018  . Solitary thyroid nodule 03/01/2015   Dominant left thyroid nodule 3.2 cm noted in 02/2015     No outpatient medications have been marked as taking for the 01/19/20 encounter (Office Visit) with Perlie Mayo, NP.   Current Facility-Administered Medications for the 01/19/20 encounter (Office Visit) with Perlie Mayo, NP  Medication  . ketorolac (TORADOL) injection 60 mg  . methylPREDNISolone acetate (DEPO-MEDROL) injection 80 mg    ROS:  Review of Systems  Musculoskeletal: Positive for joint pain.  All other systems reviewed and are negative.    Objective:   Today's Vitals: BP (!) 144/90   Pulse 84   Temp 97.7 F (36.5 C) (Temporal)   Ht 5\' 2"  (1.575 m)   Wt 234 lb 12.8 oz (106.5 kg)   SpO2 97%   BMI 42.95 kg/m  Vitals with BMI 01/19/2020 11/14/2019 11/14/2019  Height 5\' 2"  - 5\' 2"   Weight 234 lbs 13 oz - 236 lbs 1 oz  BMI [id number] - [id number]  Systolic 123456 0000000 0000000  Diastolic 90 82 86  Pulse 84 - 84     Physical Exam Vitals and nursing note reviewed.  Constitutional:      Appearance: Normal appearance. She is well-developed and well-groomed. She is morbidly obese.  HENT:     Head: Normocephalic and atraumatic.     Right Ear: External ear normal.     Left Ear: External ear normal.     Mouth/Throat:     Comments: Mask in place  Eyes:     General:        Right eye: No discharge.        Left eye: No discharge.     Conjunctiva/sclera: Conjunctivae normal.  Cardiovascular:     Rate and Rhythm: Normal rate and regular rhythm.     Pulses: Normal pulses.     Heart sounds: Normal heart sounds.  Pulmonary:     Effort: Pulmonary effort is normal.     Breath sounds: Normal breath sounds.  Musculoskeletal:     Cervical back: Normal range of motion and neck supple.     Right knee: Crepitus present. No swelling.     Left knee: Crepitus present. No swelling.  Skin:    General: Skin is warm.  Neurological:     General: No focal deficit  present.     Mental Status: She is alert and oriented to person, place, and time.  Psychiatric:        Mood and Affect: Mood normal.        Behavior: Behavior normal.        Thought Content: Thought content normal.        Judgment: Judgment normal.     Assessment   1. Primary osteoarthritis of both knees     Tests ordered No orders of the defined types were placed in this encounter.   Plan: Please see assessment and plan per problem list above.   Meds ordered this encounter  Medications  . methylPREDNISolone acetate (DEPO-MEDROL) injection 80 mg  . ketorolac (TORADOL) injection 60 mg  . meloxicam (MOBIC) 7.5 MG tablet    Sig: Take 1 tablet (7.5 mg total) by mouth daily after breakfast. Take for 1 week    Dispense:  30 tablet    Refill:  0    Order Specific Question:   Supervising Provider    Answer:   Fayrene Helper [id number]    Patient to follow-up in as scheduled.  Perlie Mayo, NP

## 2020-01-19 NOTE — Assessment & Plan Note (Addendum)
Knees are aching more. Weight is a concern-she is educated on this and advised that weight loss is the single most important and helpful self care she could do for her joints. Toradol and Depo Med injections today. Mobic for 7 days, and if not better asked to call back for xray or referral needs   Reviewed side effects, risks and benefits of medication.   Patient acknowledged agreement and understanding of the plan.

## 2020-02-28 ENCOUNTER — Telehealth: Payer: Self-pay | Admitting: Cardiology

## 2020-02-28 NOTE — Telephone Encounter (Signed)
  Patient Consent for Virtual Visit         Michelle David has provided verbal consent on 02/28/2020 for a virtual visit (video or telephone).   CONSENT FOR VIRTUAL VISIT FOR:  Michelle David  By participating in this virtual visit I agree to the following:  I hereby voluntarily request, consent and authorize [facility] and its employed or contracted physicians, physician assistants, nurse practitioners or other licensed health care professionals (the Practitioner), to provide me with telemedicine health care services (the "Services") as deemed necessary by the treating Practitioner. I acknowledge and consent to receive the Services by the Practitioner via telemedicine. I understand that the telemedicine visit will involve communicating with the Practitioner through live audiovisual communication technology and the disclosure of certain medical information by electronic transmission. I acknowledge that I have been given the opportunity to request an in-person assessment or other available alternative prior to the telemedicine visit and am voluntarily participating in the telemedicine visit.  I understand that I have the right to withhold or withdraw my consent to the use of telemedicine in the course of my care at any time, without affecting my right to future care or treatment, and that the Practitioner or I may terminate the telemedicine visit at any time. I understand that I have the right to inspect all information obtained and/or recorded in the course of the telemedicine visit and may receive copies of available information for a reasonable fee.  I understand that some of the potential risks of receiving the Services via telemedicine include:  Marland Kitchen Delay or interruption in medical evaluation due to technological equipment failure or disruption; . Information transmitted may not be sufficient (e.g. poor resolution of images) to allow for appropriate medical decision making by the  Practitioner; and/or  . In rare instances, security protocols could fail, causing a breach of personal health information.  Furthermore, I acknowledge that it is my responsibility to provide information about my medical history, conditions and care that is complete and accurate to the best of my ability. I acknowledge that Practitioner's advice, recommendations, and/or decision may be based on factors not within their control, such as incomplete or inaccurate data provided by me or distortions of diagnostic images or specimens that may result from electronic transmissions. I understand that the practice of medicine is not an exact science and that Practitioner makes no warranties or guarantees regarding treatment outcomes. I acknowledge that a copy of this consent can be made available to me via my patient portal ([facility]), or I can request a printed copy by calling the office of [facility].    I understand that my insurance will be billed for this visit.   I have read or had this consent read to me. . I understand the contents of this consent, which adequately explains the benefits and risks of the Services being provided via telemedicine.  . I have been provided ample opportunity to ask questions regarding this consent and the Services and have had my questions answered to my satisfaction. . I give my informed consent for the services to be provided through the use of telemedicine in my medical care

## 2020-03-01 NOTE — Progress Notes (Signed)
Virtual Visit via Telephone Note   This visit type was conducted due to national recommendations for restrictions regarding the COVID-19 Pandemic (e.g. social distancing) in an effort to limit this patient's exposure and mitigate transmission in our community.  Due to her co-morbid illnesses, this patient is at least at moderate risk for complications without adequate follow up.  This format is felt to be most appropriate for this patient at this time.  The patient did not have access to video technology/had technical difficulties with video requiring transitioning to audio format only (telephone).  All issues noted in this document were discussed and addressed.  No physical exam could be performed with this format.  Please refer to the patient's chart for her  consent to telehealth for [facility].   The patient was identified using 2 identifiers.  Date:  03/02/2020   ID:  Michelle David, Michelle David 04-24-1961, MRN [id number]  Patient Location: Home Provider Location: Home  PCP:  Fayrene Helper, MD  Cardiologist:  Rozann Lesches, MD Electrophysiologist:  None   Evaluation Performed:  Follow-Up Visit  Chief Complaint:   Cardiac follow-up  History of Present Illness:    Michelle David is a 59 y.o. female last seen in November 2020 by Ms. Strader PA-C.  We spoke by phone today.  She states that she has been doing reasonably well, no progressive shortness of breath or recurring chest pain.  She remains functional with her ADLs.  I reviewed her medications which are outlined below.  Cardiac regimen has been stable, she reports no intolerances.  Her last echocardiogram was in October 2019 at which point LVEF was 45 to 50% range.   Past Medical History:  Diagnosis Date  . Cardiomyopathy [facility])    Diagnosed March 2018  . CHF (congestive heart failure) ([place])    a. EF 30-35% by echo in 12/2016 b. EF improved to 45-50% in 07/2018  . Dyspepsia   . Essential hypertension   .  Hyperlipidemia   . Hypoxia 11/01/2018  . Impaired glucose tolerance   . Multifocal pneumonia 11/01/2018  . Obesity   . Snoring 01/22/2018  . Solitary thyroid nodule 03/01/2015   Dominant left thyroid nodule 3.2 cm noted in 02/2015    Past Surgical History:  Procedure Laterality Date  . COLONOSCOPY N/A 03/19/2018   Procedure: COLONOSCOPY;  Surgeon: Danie Binder, MD;  Location: [facility];  Service: Endoscopy;  Laterality: N/A;  2:00  . OOPHORECTOMY    . TUBAL LIGATION       Current Meds  Medication Sig  . acetaminophen (TYLENOL) 500 MG tablet Take one tablet by mouth two times daily, as needed, for arthritis pain  . carvedilol (COREG) 25 MG tablet Take 1 tablet (25 mg total) by mouth 2 (two) times daily with a meal.  . furosemide (LASIX) 20 MG tablet Take 1 tablet (20 mg total) by mouth daily.  Marland Kitchen losartan (COZAAR) 50 MG tablet Take 1 tablet (50 mg total) by mouth daily.  . rosuvastatin (CRESTOR) 20 MG tablet Take 1 tablet (20 mg total) by mouth daily.     Allergies:   Patient has no known allergies.   ROS:   No palpitations or syncope.  Prior CV studies:   The following studies were reviewed today:  Echocardiogram 08/18/2018: Study Conclusions   - Left ventricle: The cavity size was normal. Wall thickness was  increased in a pattern of moderate LVH. Systolic function was  mildly reduced. The estimated ejection fraction  was in the range  of 45% to 50%. Doppler parameters are consistent with abnormal  left ventricular relaxation (grade 1 diastolic dysfunction).  - Aortic valve: Valve area (VTI): 1.86 cm^2. Valve area (Vmax):  2.01 cm^2. Valve area (Vmean): 1.97 cm^2.  - Mitral valve: There was mild regurgitation.  - Technically difficult study. Echocontrast was used to enhance  visualization.   Labs/Other Tests and Data Reviewed:    EKG:  An ECG dated 09/12/2018 was personally reviewed today and demonstrated:  Sinus rhythm with decreased anteroseptal R waves  and nonspecific ST changes.  Recent Labs: 11/14/2019: ALT 12; BUN 12; Creat 0.83; Hemoglobin 12.2; Platelets 359; Potassium 3.6; Sodium 143   Recent Lipid Panel Lab Results  Component Value Date/Time   CHOL 150 11/14/2019 03:59 PM   TRIG 95 11/14/2019 03:59 PM   HDL 40 (L) 11/14/2019 03:59 PM   CHOLHDL 3.8 11/14/2019 03:59 PM   LDLCALC 91 11/14/2019 03:59 PM    Wt Readings from Last 3 Encounters:  03/02/20 230 lb (104.3 kg)  01/19/20 234 lb 12.8 oz (106.5 kg)  11/14/19 236 lb 0.6 oz (107.1 kg)     Objective:    Vital Signs:  Ht 5\' 2"  (1.575 m)   Wt 230 lb (104.3 kg)   BMI 42.07 kg/m    Patient spoke in full sentences, not short of breath. No audible wheezing or coughing.  ASSESSMENT & PLAN:    1.  Nonischemic cardiomyopathy, LVEF 45 to 50% range by echocardiogram in October 2019.  She is clinically stable at this point based on discussion today.  Continue Coreg, Cozaar, and Lasix.  Follow-up echocardiogram in 6 months with office visit.  2.  Mixed hyperlipidemia, she is on Crestor at this point and continues to follow with Dr. Moshe Cipro.  Last LDL 91.   Time:   Today, I have spent 5 minutes with the patient with telehealth technology discussing the above problems.     Medication Adjustments/Labs and Tests Ordered: Current medicines are reviewed at length with the patient today.  Concerns regarding medicines are outlined above.   Tests Ordered: Orders Placed This Encounter  Procedures  . ECHOCARDIOGRAM COMPLETE    Medication Changes: No orders of the defined types were placed in this encounter.   Follow Up:  In Person 6 months with [place] in the [place] office.  Signed, Rozann Lesches, MD  03/02/2020 11:02 AM    [place]

## 2020-03-02 ENCOUNTER — Encounter: Payer: Self-pay | Admitting: Cardiology

## 2020-03-02 ENCOUNTER — Telehealth (INDEPENDENT_AMBULATORY_CARE_PROVIDER_SITE_OTHER): Payer: Self-pay | Admitting: Cardiology

## 2020-03-02 VITALS — Ht 62.0 in | Wt 230.0 lb

## 2020-03-02 DIAGNOSIS — I10 Essential (primary) hypertension: Secondary | ICD-10-CM

## 2020-03-02 DIAGNOSIS — I428 Other cardiomyopathies: Secondary | ICD-10-CM

## 2020-03-02 DIAGNOSIS — E782 Mixed hyperlipidemia: Secondary | ICD-10-CM

## 2020-03-02 NOTE — Patient Instructions (Signed)
Medication Instructions:  Your physician recommends that you continue on your current medications as directed. Please refer to the Current Medication list given to you today.  *If you need a refill on your cardiac medications before your next appointment, please call your pharmacy*   Lab Work: None today If you have labs (blood work) drawn today and your tests are completely normal, you will receive your results only by: Marland Kitchen MyChart Message (if you have MyChart) OR . A paper copy in the mail If you have any lab test that is abnormal or we need to change your treatment, we will call you to review the results.   Testing/Procedures: Your physician has requested that you have an echocardiogram in [place]. Echocardiography is a painless test that uses sound waves to create images of your heart. It provides your doctor with information about the size and shape of your heart and how well your heart's chambers and valves are working. This procedure takes approximately one hour. There are no restrictions for this procedure.     Follow-Up: At [facility], you and your health needs are our priority.  As part of our continuing mission to provide you with exceptional heart care, we have created designated Provider Care Teams.  These Care Teams include your primary Cardiologist (physician) and Advanced Practice Providers (APPs -  Physician Assistants and Nurse Practitioners) who all work together to provide you with the care you need, when you need it.  We recommend signing up for the patient portal called "MyChart".  Sign up information is provided on this After Visit Summary.  MyChart is used to connect with patients for Virtual Visits (Telemedicine).  Patients are able to view lab/test results, encounter notes, upcoming appointments, etc.  Non-urgent messages can be sent to your provider as well.   To learn more about what you can do with MyChart, go to [email or web address].    Your next  appointment:   6 month(s)  The format for your next appointment:   In Person  Provider:   Bernerd Pho, PA-C   Other Instructions None      Thank you for choosing [facility] !

## 2020-04-03 ENCOUNTER — Other Ambulatory Visit: Payer: Self-pay

## 2020-04-03 ENCOUNTER — Encounter: Payer: Self-pay | Admitting: Family Medicine

## 2020-04-03 ENCOUNTER — Other Ambulatory Visit (HOSPITAL_COMMUNITY)
Admission: RE | Admit: 2020-04-03 | Discharge: 2020-04-03 | Disposition: A | Discharge status: Home / Self Care | Payer: Self-pay | Source: Ambulatory Visit | Attending: Family Medicine | Admitting: Family Medicine

## 2020-04-03 ENCOUNTER — Ambulatory Visit (INDEPENDENT_AMBULATORY_CARE_PROVIDER_SITE_OTHER): Payer: Self-pay | Admitting: Family Medicine

## 2020-04-03 VITALS — BP 148/92 | HR 81 | Temp 97.1°F | Resp 16 | Ht 62.0 in | Wt 239.0 lb

## 2020-04-03 DIAGNOSIS — Z0000 Encounter for general adult medical examination without abnormal findings: Secondary | ICD-10-CM

## 2020-04-03 DIAGNOSIS — Z124 Encounter for screening for malignant neoplasm of cervix: Secondary | ICD-10-CM

## 2020-04-03 DIAGNOSIS — I10 Essential (primary) hypertension: Secondary | ICD-10-CM

## 2020-04-03 NOTE — Progress Notes (Signed)
Michelle David     MRN: [id number]      DOB: 1960/11/17  HPI: Patient is in for annual physical exam. C/o tingling  And shooting pain in right forearm intermittently x 3 months, no current pain Immunization is reviewed , recommend covid vaccine.   PE: BP (!) 148/92   Pulse 81   Temp (!) 97.1 F (36.2 C) (Temporal)   Resp 16   Ht 5\' 2"  (1.575 m)   Wt 239 lb (108.4 kg)   SpO2 98%   BMI 43.71 kg/m   Pleasant  female, alert and oriented x 3, in no cardio-pulmonary distress. Afebrile. HEENT No facial trauma or asymetry. Sinuses non tender. Poor dentition Extra occullar muscles intact.. External ears normal, . Neck: decreased ROM, no adenopathy,JVD or thyromegaly.No bruits.  Chest: Clear to ascultation bilaterally.No crackles or wheezes. Non tender to palpation  Breast: No asymetry,no masses or lumps. No tenderness. No nipple discharge or inversion. No axillary or supraclavicular adenopathy  Cardiovascular system; Heart sounds normal,  S1 and  S2 ,no S3.  No murmur, or thrill. Apical beat not displaced Peripheral pulses normal.  Abdomen: Soft, non tender, no organomegaly or masses. No bruits. Bowel sounds normal. No guarding, tenderness or rebound.   GU: External genitalia normal female genitalia , normal female distribution of hair. No lesions. Urethral meatus normal in size, no  Prolapse, no lesions visibly  Present. Bladder non tender. Vagina pink and moist , with no visible lesions , discharge present . Adequate pelvic support no  cystocele or rectocele noted Cervix pink and appears healthy, no lesions or ulcerations noted, no discharge noted from os Uterus normal size, no adnexal masses, no cervical motion or adnexal tenderness.   Musculoskeletal exam: Full ROM of spine, hips , shoulders and knees. No deformity ,swelling or crepitus noted. No muscle wasting or atrophy.   Neurologic: Cranial nerves 2 to 12 intact. Power, tone ,sensation and  reflexes normal throughout. No disturbance in gait. No tremor.  Skin: Intact, no ulceration, erythema , scaling or rash noted. Pigmentation normal throughout  Psych; Normal mood and affect. Judgement and concentration normal   Assessment & Plan:  Annual physical exam Annual exam as documented. Counseling done  re healthy lifestyle involving commitment to 150 minutes exercise per week, heart healthy diet, and attaining healthy weight.The importance of adequate sleep also discussed. Regular seat belt use and home safety, is also discussed. Changes in health habits are decided on by the patient with goals and time frames  set for achieving them. Immunization and cancer screening needs are specifically addressed at this visit.   HTN (hypertension), malignant Elevated and not at goal. Medication compliance a continued challenge DASH diet and commitment to daily physical activity for a minimum of 30 minutes discussed and encouraged, as a part of hypertension management. The importance of attaining a healthy weight is also discussed.  BP/Weight 04/03/2020 03/02/2020 01/19/2020 11/14/2019 09/14/2019 09/14/2019 3/54/6568  Systolic BP 127 - 517 001 749 449 675  Diastolic BP 92 - 90 82 84 82 70  Wt. (Lbs) 239 230 234.8 236.04 230 230 222.04  BMI 43.71 42.07 42.95 43.17 42.07 42.07 40.61       Morbid obesity  Patient re-educated about  the importance of commitment to a  minimum of 150 minutes of exercise per week as able.  The importance of healthy food choices with portion control discussed, as well as eating regularly and within a 12 hour window most days. The need to  choose "clean , green" food 50 to 75% of the time is discussed, as well as to make water the primary drink and set a goal of 64 ounces water daily.    Weight /BMI 04/03/2020 03/02/2020 01/19/2020  WEIGHT 239 lb 230 lb 234 lb 12.8 oz  HEIGHT 5\' 2"  5\' 2"  5\' 2"   BMI 43.71 kg/m2 42.07 kg/m2 42.95 kg/m2

## 2020-04-03 NOTE — Assessment & Plan Note (Signed)

## 2020-04-03 NOTE — Patient Instructions (Signed)
F/U in office in 4 months, call if you need me sooner  Please change food choice so that you become healthier, blood pressure will improve and you will reduce your risk of becoming diabetic  Please get the 2 covid vaccines, they will help to protect you  Go to the Ed if you have chest or neck or arm pain that does not go away , and is severe,also keep appt with cardiology  Pap is sent  Please get mammogram  Need to work on losing 10 pounds in next 4 months, you can do this  Thanks for choosing [facility], we consider it a privelige to serve you.

## 2020-04-04 LAB — CYTOLOGY - PAP
Comment: NEGATIVE
Diagnosis: NEGATIVE
High risk HPV: NEGATIVE

## 2020-04-06 ENCOUNTER — Encounter: Payer: Self-pay | Admitting: Family Medicine

## 2020-04-06 NOTE — Assessment & Plan Note (Signed)
  Patient re-educated about  the importance of commitment to a  minimum of 150 minutes of exercise per week as able.  The importance of healthy food choices with portion control discussed, as well as eating regularly and within a 12 hour window most days. The need to choose "clean , green" food 50 to 75% of the time is discussed, as well as to make water the primary drink and set a goal of 64 ounces water daily.    Weight /BMI 04/03/2020 03/02/2020 01/19/2020  WEIGHT 239 lb 230 lb 234 lb 12.8 oz  HEIGHT 5\' 2"  5\' 2"  5\' 2"   BMI 43.71 kg/m2 42.07 kg/m2 42.95 kg/m2

## 2020-04-06 NOTE — Assessment & Plan Note (Signed)
Elevated and not at goal. Medication compliance a continued challenge DASH diet and commitment to daily physical activity for a minimum of 30 minutes discussed and encouraged, as a part of hypertension management. The importance of attaining a healthy weight is also discussed.  BP/Weight 04/03/2020 03/02/2020 01/19/2020 11/14/2019 09/14/2019 09/14/2019 06/17/5620  Systolic BP 308 - 657 846 962 952 841  Diastolic BP 92 - 90 82 84 82 70  Wt. (Lbs) 239 230 234.8 236.04 230 230 222.04  BMI 43.71 42.07 42.95 43.17 42.07 42.07 40.61

## 2020-04-13 ENCOUNTER — Other Ambulatory Visit: Payer: Self-pay | Admitting: Family Medicine

## 2020-04-13 ENCOUNTER — Inpatient Hospital Stay: Admission: RE | Admit: 2020-04-13 | Payer: Self-pay | Source: Ambulatory Visit

## 2020-04-13 ENCOUNTER — Ambulatory Visit
Admission: RE | Admit: 2020-04-13 | Discharge: 2020-04-13 | Disposition: A | Discharge status: Home / Self Care | Payer: Self-pay | Source: Ambulatory Visit | Attending: Family Medicine | Admitting: Family Medicine

## 2020-04-13 ENCOUNTER — Other Ambulatory Visit: Payer: Self-pay

## 2020-04-13 DIAGNOSIS — Z1231 Encounter for screening mammogram for malignant neoplasm of breast: Secondary | ICD-10-CM

## 2020-04-16 ENCOUNTER — Ambulatory Visit: Payer: Self-pay | Admitting: Family Medicine

## 2020-06-04 ENCOUNTER — Other Ambulatory Visit: Payer: Self-pay

## 2020-06-04 ENCOUNTER — Telehealth (INDEPENDENT_AMBULATORY_CARE_PROVIDER_SITE_OTHER): Payer: 59 | Admitting: Family Medicine

## 2020-06-04 ENCOUNTER — Encounter: Payer: Self-pay | Admitting: Family Medicine

## 2020-06-04 VITALS — Ht 62.0 in | Wt 239.0 lb

## 2020-06-04 DIAGNOSIS — G8929 Other chronic pain: Secondary | ICD-10-CM | POA: Diagnosis not present

## 2020-06-04 DIAGNOSIS — R200 Anesthesia of skin: Secondary | ICD-10-CM

## 2020-06-04 DIAGNOSIS — M79642 Pain in left hand: Secondary | ICD-10-CM

## 2020-06-04 NOTE — Patient Instructions (Addendum)
F/U in October as before, call if you need me sooner  You are referred to Dr Merlene Laughter for nerve conduction testing   Good that you got your covid vaccine, please get your second also  It is important that you exercise regularly at least 30 minutes 5 times a week. If you develop chest pain, have severe difficulty breathing, or feel very tired, stop exercising immediately and seek medical attention  Think about what you will eat, plan ahead. Choose " clean, green, fresh or frozen" over canned, processed or packaged foods which are more sugary, salty and fatty. 70 to 75% of food eaten should be vegetables and fruit. Three meals at set times with snacks allowed between meals, but they must be fruit or vegetables. Aim to eat over a 12 hour period , example 7 am to 7 pm, and STOP after  your last meal of the day. Drink water,generally about 64 ounces per day, no other drink is as healthy. Fruit juice is best enjoyed in a healthy way, by EATING the fruit. Thanks for choosing [facility], we consider it a privelige to serve you.

## 2020-06-04 NOTE — Progress Notes (Signed)
Virtual Visit via Telephone Note  I connected with Michelle David on 06/04/20 at  2:00 PM EDT by telephone and verified that I am speaking with the correct person using two identifiers.  Location: Patient: home Provider: office   I discussed the limitations, risks, security and privacy concerns of performing an evaluation and management service by telephone and the availability of in person appointments. I also discussed with the patient that there may be a patient responsible charge related to this service. The patient expressed understanding and agreed to proceed.   History of Present Illness:    burning from left elbow to 4th and 5 th fingers x 3 months, progressively worsening, no h/o trauma, uncertain if there is new weakness in the hand Doing ell otherwise with no other concerns  Or complaints for this visit   Observations/Objective: Ht 5\' 2"  (1.575 m)    Wt 239 lb (108.4 kg)    BMI 43.71 kg/m  Good communication with no confusion and intact memory. Alert and oriented x 3 No signs of respiratory distress during speech    Assessment and Plan:  Chronic hand pain, left 3 month h/o progressive num bness and sting of left forearm and hand from the elbow, refer for nerve conduction tests   Follow Up Instructions:    I discussed the assessment and treatment plan with the patient. The patient was provided an opportunity to ask questions and all were answered. The patient agreed with the plan and demonstrated an understanding of the instructions.   The patient was advised to call back or seek an in-person evaluation if the symptoms worsen or if the condition fails to improve as anticipated.  I provided 10 minutes of non-face-to-face time during this encounter.   Tula Nakayama, MD

## 2020-06-10 ENCOUNTER — Encounter: Payer: Self-pay | Admitting: Family Medicine

## 2020-06-10 DIAGNOSIS — G8929 Other chronic pain: Secondary | ICD-10-CM

## 2020-06-10 HISTORY — DX: Other chronic pain: G89.29

## 2020-06-10 NOTE — Assessment & Plan Note (Addendum)
3 month h/o progressive num bness and sting of left forearm and hand from the elbow, refer for nerve conduction tests

## 2020-08-15 ENCOUNTER — Encounter: Payer: Self-pay | Admitting: Family Medicine

## 2020-08-15 ENCOUNTER — Other Ambulatory Visit: Payer: Self-pay

## 2020-08-15 ENCOUNTER — Ambulatory Visit (INDEPENDENT_AMBULATORY_CARE_PROVIDER_SITE_OTHER): Payer: 59 | Admitting: Family Medicine

## 2020-08-15 VITALS — BP 153/86 | HR 58 | Resp 16 | Ht 63.0 in | Wt 240.0 lb

## 2020-08-15 DIAGNOSIS — I5042 Chronic combined systolic (congestive) and diastolic (congestive) heart failure: Secondary | ICD-10-CM

## 2020-08-15 DIAGNOSIS — Z23 Encounter for immunization: Secondary | ICD-10-CM | POA: Diagnosis not present

## 2020-08-15 DIAGNOSIS — E785 Hyperlipidemia, unspecified: Secondary | ICD-10-CM

## 2020-08-15 DIAGNOSIS — E042 Nontoxic multinodular goiter: Secondary | ICD-10-CM

## 2020-08-15 DIAGNOSIS — I10 Essential (primary) hypertension: Secondary | ICD-10-CM

## 2020-08-15 NOTE — Patient Instructions (Signed)
U in office with MD in 5 months, call if you need me before  Flu vaccine today  You are referred for and [place] of your thyroid gland  Labs today, lipid, cmp and EGFr and tSH  Think about what you will eat, plan ahead. Choose " clean, green, fresh or frozen" over canned, processed or packaged foods which are more sugary, salty and fatty. 70 to 75% of food eaten should be vegetables and fruit. Three meals at set times with snacks allowed between meals, but they must be fruit or vegetables. Aim to eat over a 12 hour period , example 7 am to 7 pm, and STOP after  your last meal of the day. Drink water,generally about 64 ounces per day, no other drink is as healthy. Fruit juice is best enjoyed in a healthy way, by EATING the fruit.  Thanks for choosing [facility], we consider it a privelige to serve you.

## 2020-08-17 ENCOUNTER — Other Ambulatory Visit: Payer: Self-pay | Admitting: Family Medicine

## 2020-08-18 LAB — LIPID PANEL
Cholesterol: 186 mg/dL (ref <200)
HDL: 45 mg/dL — ABNORMAL LOW (ref >=50)
LDL Cholesterol (Calc): 122 mg/dL (calc) — ABNORMAL HIGH
Non-HDL Cholesterol (Calc): 141 mg/dL (calc) — ABNORMAL HIGH (ref <130)
Total CHOL/HDL Ratio: 4.1 (calc) (ref <5.0)
Triglycerides: 91 mg/dL (ref <150)

## 2020-08-18 LAB — COMPLETE METABOLIC PANEL WITH GFR
AG Ratio: 0.9 (calc) — ABNORMAL LOW (ref 1.0–2.5)
ALT: 12 U/L (ref 6–29)
AST: 16 U/L (ref 10–35)
Albumin: 3.7 g/dL (ref 3.6–5.1)
Alkaline phosphatase (APISO): 64 U/L (ref 37–153)
BUN/Creatinine Ratio: 14 (calc) (ref 6–22)
BUN: 17 mg/dL (ref 7–25)
CO2: 30 mmol/L (ref 20–32)
Calcium: 9.1 mg/dL (ref 8.6–10.4)
Chloride: 104 mmol/L (ref 98–110)
Creat: 1.2 mg/dL — ABNORMAL HIGH (ref 0.50–1.05)
GFR, Est African American: 59 mL/min/{1.73_m2} — ABNORMAL LOW (ref >=60)
GFR, Est Non African American: 50 mL/min/{1.73_m2} — ABNORMAL LOW (ref >=60)
Globulin: 4 g/dL (calc) — ABNORMAL HIGH (ref 1.9–3.7)
Glucose, Bld: 78 mg/dL (ref 65–139)
Potassium: 3.9 mmol/L (ref 3.5–5.3)
Sodium: 140 mmol/L (ref 135–146)
Total Bilirubin: 0.3 mg/dL (ref 0.2–1.2)
Total Protein: 7.7 g/dL (ref 6.1–8.1)

## 2020-08-18 LAB — TSH: TSH: 1.68 mIU/L (ref 0.40–4.50)

## 2020-08-21 ENCOUNTER — Ambulatory Visit (HOSPITAL_COMMUNITY): Admission: RE | Admit: 2020-08-21 | Payer: 59 | Source: Ambulatory Visit

## 2020-08-21 ENCOUNTER — Encounter: Payer: Self-pay | Admitting: Family Medicine

## 2020-08-21 DIAGNOSIS — E042 Nontoxic multinodular goiter: Secondary | ICD-10-CM | POA: Insufficient documentation

## 2020-08-21 DIAGNOSIS — E041 Nontoxic single thyroid nodule: Secondary | ICD-10-CM | POA: Insufficient documentation

## 2020-08-21 MED ORDER — ATORVASTATIN CALCIUM 10 MG PO TABS: 10.0000 mg | ORAL_TABLET | Freq: Every day | ORAL | 3 refills | Status: DC

## 2020-08-21 NOTE — Progress Notes (Signed)
   Michelle David     MRN: [id number]      DOB: 09-21-1961   HPI Michelle David is here for follow up and re-evaluation of chronic medical conditions, medication management and review of any available recent lab and radiology data.  Preventive health is updated, specifically  Cancer screening and Immunization.   C/o left sided neck pain x approx 2 to 3 months, has multinodular goiter and wants this re evaluated C/o tender area in  left abdomen . Denies nausea, vomit, change in BM  ROS Denies recent fever or chills. Denies sinus pressure, nasal congestion, ear pain or sore throat. Denies chest congestion, productive cough or wheezing. Denies chest pains, palpitations and leg swelling Denies , nausea, vomiting,diarrhea or constipation.   Denies dysuria, frequency, hesitancy or incontinence.  Denies headaches, seizures, numbness, or tingling. Denies depression, anxiety or insomnia. Denies skin break down or rash.   PE  BP (!) 153/86   Pulse (!) 58   Resp 16   Ht 5\' 3"  (1.6 m)   Wt 240 lb (108.9 kg)   SpO2 95%   BMI 42.51 kg/m   Patient alert and oriented and in no cardiopulmonary distress.  HEENT: No facial asymmetry, EOMI,     Neck decreased ROM with left spasm. Goiter Chest: Clear to auscultation bilaterally.  CVS: S1, S2 systolic  murmur, no S3.Regular rate.  ABD: Soft non tender.   Ext: No edema  MS: Adequate ROM spine, shoulders, hips and knees.  Skin: Intact, no ulcerations or rash noted.  Psych: Good eye contact, normal affect. Memory intact not anxious or depressed appearing.  CNS: CN 2-12 intact, power,  normal throughout.no focal deficits noted.   Assessment & Plan HTN (hypertension), malignant Controlled, no change in medication DASH diet and commitment to daily physical activity for a minimum of 30 minutes discussed and encouraged, as a part of hypertension management. The importance of attaining a healthy weight is also discussed.  BP/Weight  08/15/2020 06/04/2020 04/03/2020 03/02/2020 01/19/2020 11/14/2019 10/12/8249  Systolic BP 037 - 048 - 889 169 450  Diastolic BP 86 - 92 - 90 82 84  Wt. (Lbs) 240 239 239 230 234.8 236.04 230  BMI 42.51 43.71 43.71 42.07 42.95 43.17 42.07       Hyperlipidemia LDL goal <100 Hyperlipidemia:Low fat diet discussed and encouraged.   Lipid Panel  Lab Results  Component Value Date   CHOL 186 08/17/2020   HDL 45 (L) 08/17/2020   LDLCALC 122 (H) 08/17/2020   TRIG 91 08/17/2020   CHOLHDL 4.1 08/17/2020     Uneeds to resume statin   Morbid obesity  Patient re-educated about  the importance of commitment to a  minimum of 150 minutes of exercise per week as able.  The importance of healthy food choices with portion control discussed, as well as eating regularly and within a 12 hour window most days. The need to choose "clean , green" food 50 to 75% of the time is discussed, as well as to make water the primary drink and set a goal of 64 ounces water daily.    Weight /BMI 08/15/2020 06/04/2020 04/03/2020  WEIGHT 240 lb 239 lb 239 lb  HEIGHT 5\' 3"  5\' 2"  5\' 2"   BMI 42.51 kg/m2 43.71 kg/m2 43.71 kg/m2      Chronic combined systolic and diastolic CHF (congestive heart failure) (HCC) Currently stable and not decompensated  Multiple thyroid nodules US thyroid to re evaluate

## 2020-08-21 NOTE — Assessment & Plan Note (Signed)
US thyroid to re evaluate

## 2020-08-21 NOTE — Assessment & Plan Note (Signed)
  Patient re-educated about  the importance of commitment to a  minimum of 150 minutes of exercise per week as able.  The importance of healthy food choices with portion control discussed, as well as eating regularly and within a 12 hour window most days. The need to choose "clean , green" food 50 to 75% of the time is discussed, as well as to make water the primary drink and set a goal of 64 ounces water daily.    Weight /BMI 08/15/2020 06/04/2020 04/03/2020  WEIGHT 240 lb 239 lb 239 lb  HEIGHT 5\' 3"  5\' 2"  5\' 2"   BMI 42.51 kg/m2 43.71 kg/m2 43.71 kg/m2

## 2020-08-21 NOTE — Assessment & Plan Note (Signed)
Hyperlipidemia:Low fat diet discussed and encouraged.   Lipid Panel  Lab Results  Component Value Date   CHOL 186 08/17/2020   HDL 45 (L) 08/17/2020   LDLCALC 122 (H) 08/17/2020   TRIG 91 08/17/2020   CHOLHDL 4.1 08/17/2020     Uneeds to resume statin

## 2020-08-21 NOTE — Assessment & Plan Note (Signed)
Currently stable and not decompensated

## 2020-08-21 NOTE — Assessment & Plan Note (Signed)
Controlled, no change in medication DASH diet and commitment to daily physical activity for a minimum of 30 minutes discussed and encouraged, as a part of hypertension management. The importance of attaining a healthy weight is also discussed.  BP/Weight 08/15/2020 06/04/2020 04/03/2020 03/02/2020 01/19/2020 11/14/2019 20/72/1828  Systolic BP 833 - 744 - 514 604 799  Diastolic BP 86 - 92 - 90 82 84  Wt. (Lbs) 240 239 239 230 234.8 236.04 230  BMI 42.51 43.71 43.71 42.07 42.95 43.17 42.07

## 2020-09-03 ENCOUNTER — Ambulatory Visit (HOSPITAL_COMMUNITY): Admission: RE | Admit: 2020-09-03 | Payer: 59 | Source: Ambulatory Visit

## 2020-09-17 ENCOUNTER — Other Ambulatory Visit: Payer: Self-pay | Admitting: Student

## 2020-10-03 IMAGING — DX DG CHEST 2V
2 series · 2 of 2 positions shown · non-contrast
Comparison: 03/26/2017

CLINICAL DATA: Cough.  Acute on chronic heart failure

EXAM:
CHEST - 2 VIEW

[chest pa]
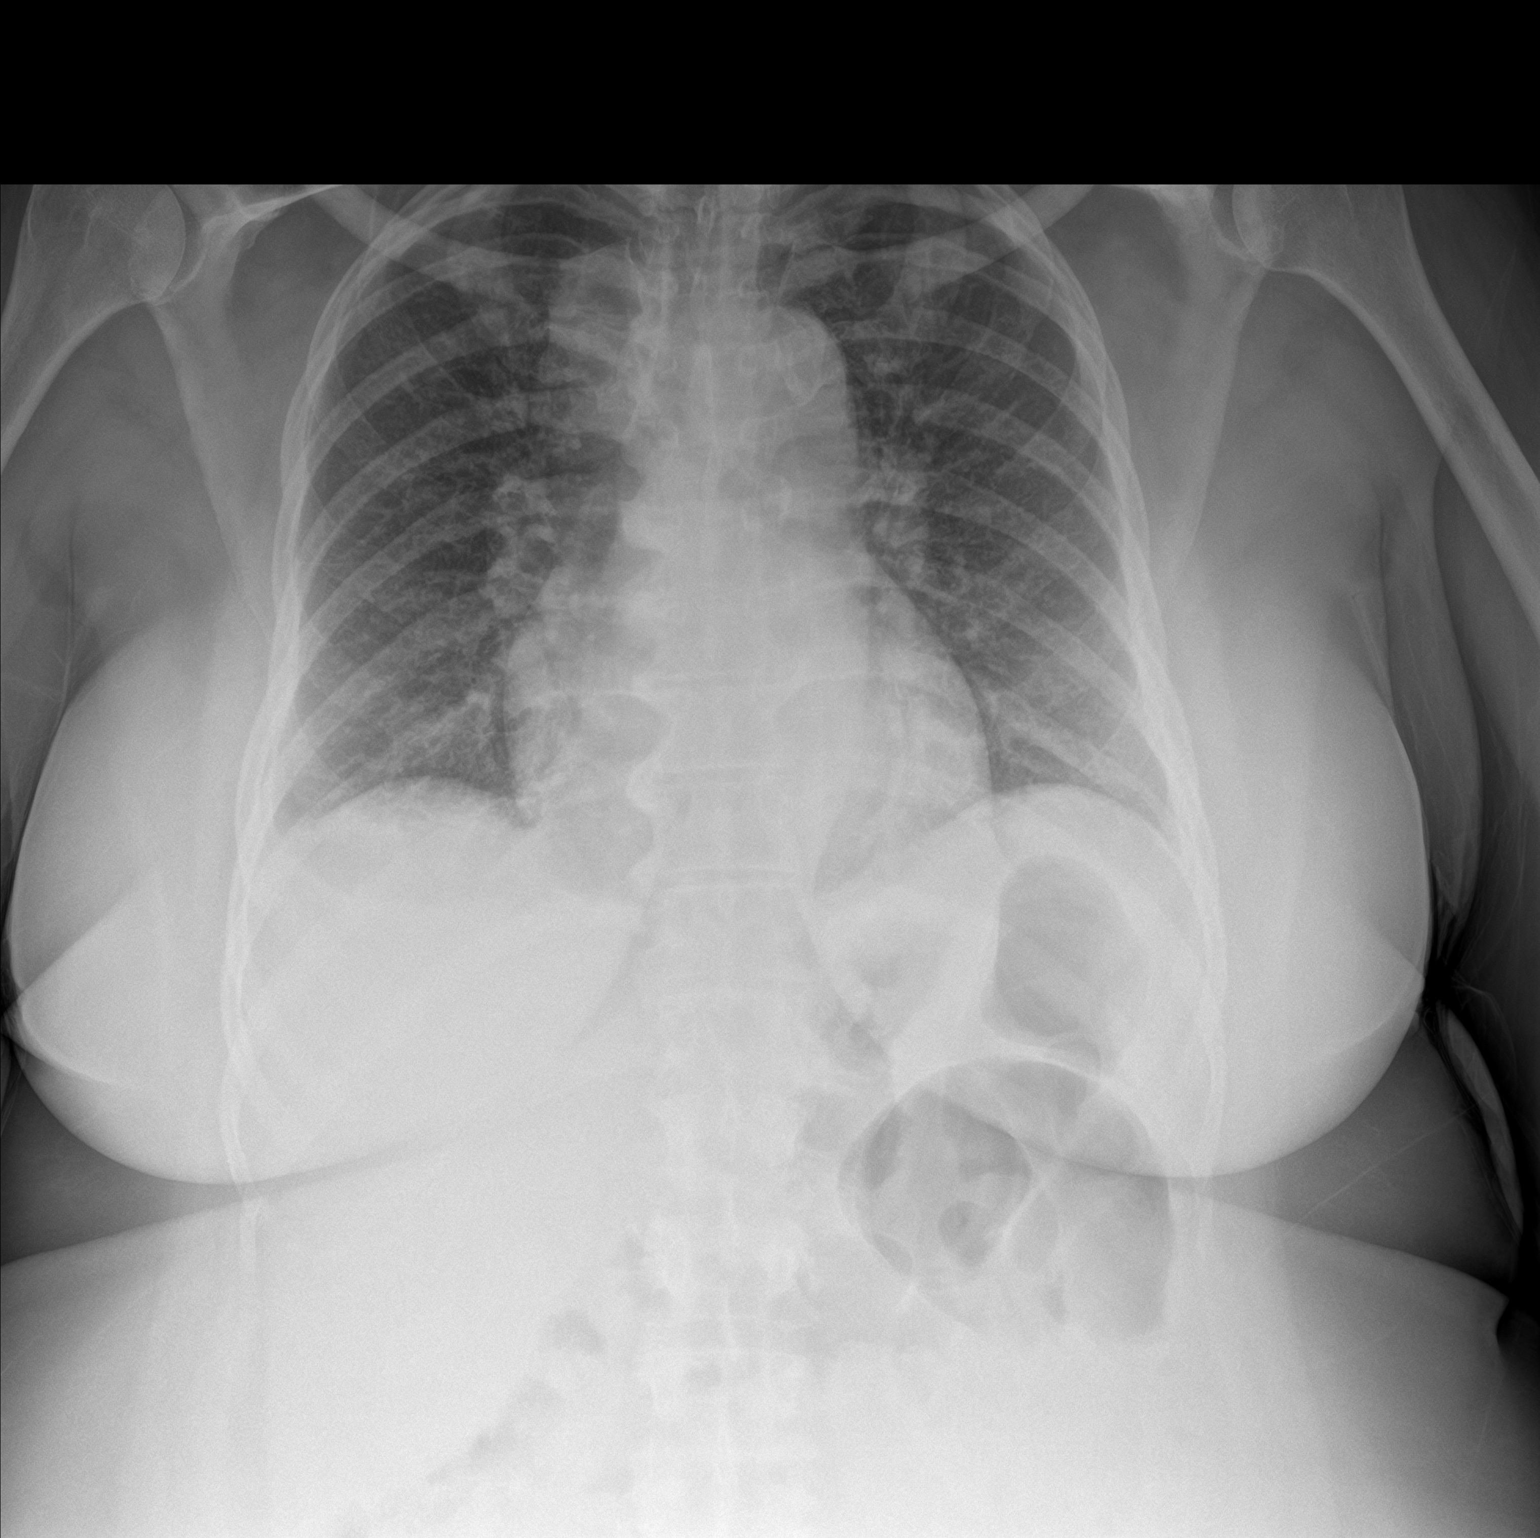

[chest lat]
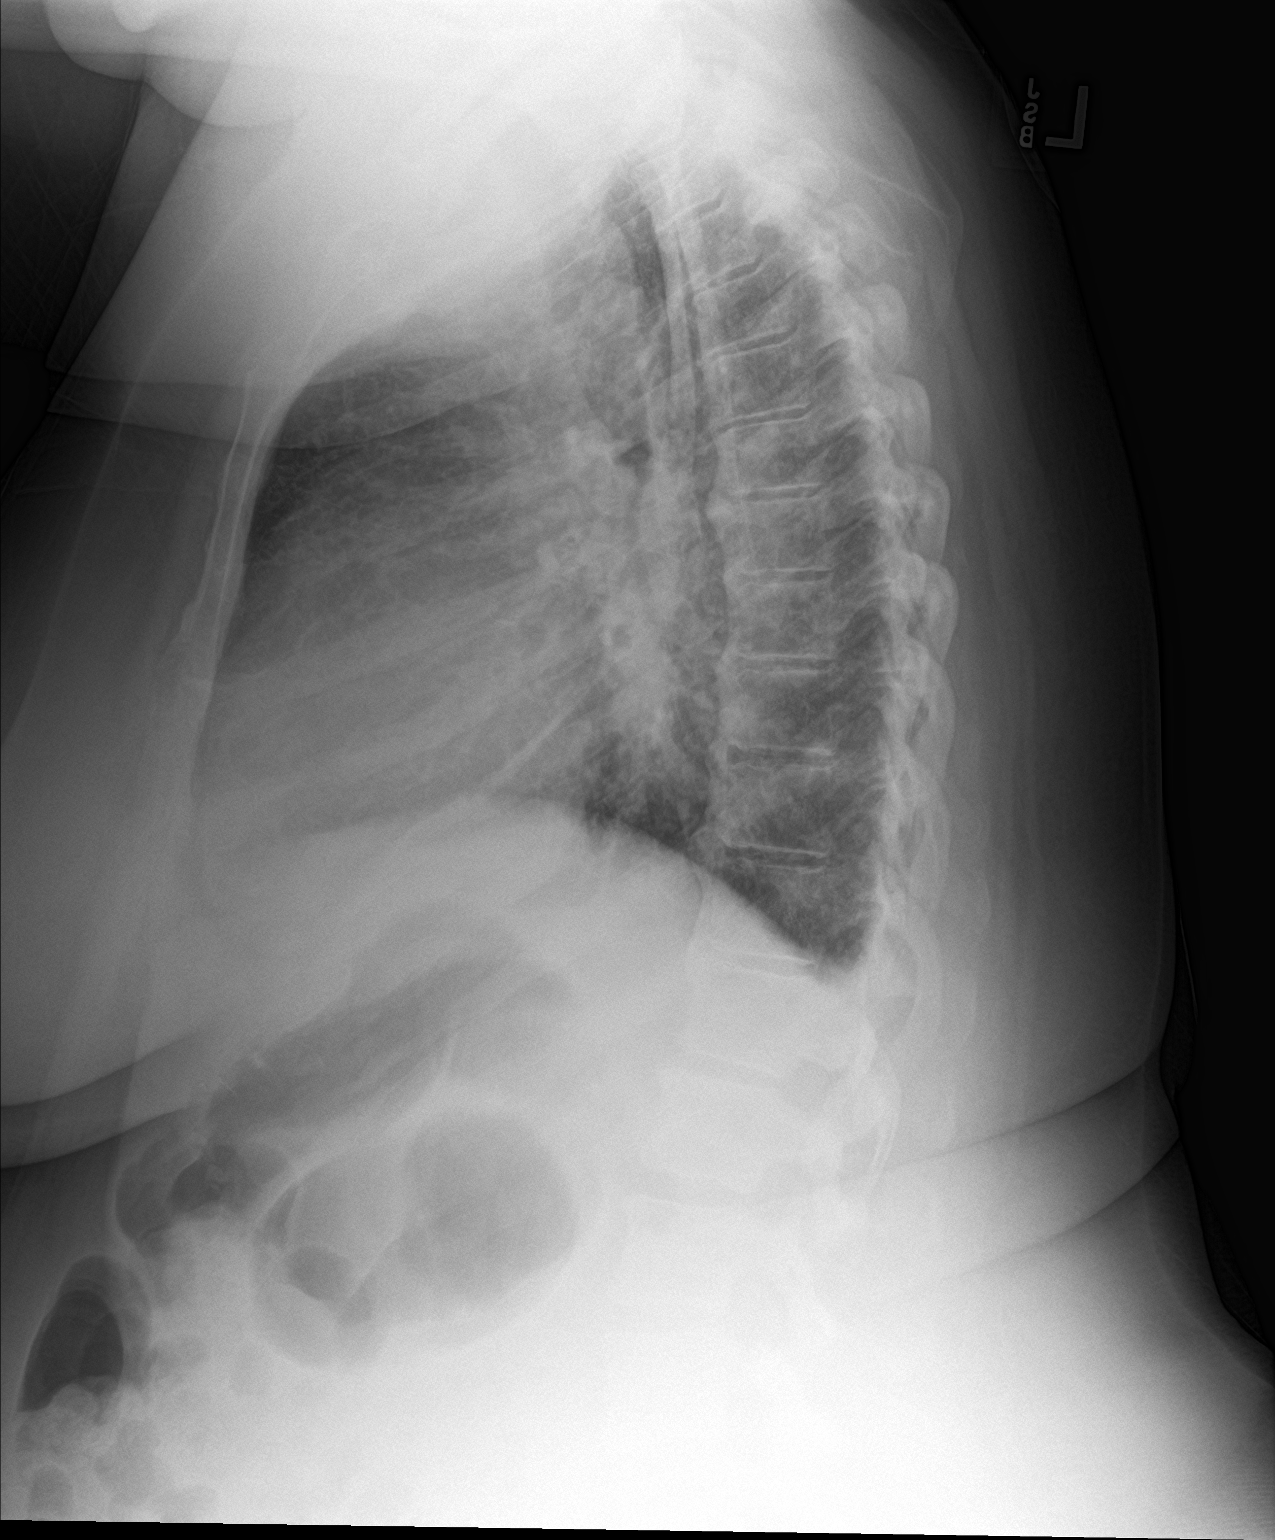

[2 of 2 positions shown; findings below may reference images not displayed]

FINDINGS: Borderline heart size. Stable aortic tortuosity. The right
paratracheal interface is preserved with superimposed hazy density
similar to prior and likely combination of venous and osseous
structures. There is generalized interstitial coarsening without
Kerley lines or air bronchogram. No effusion or pneumothorax.
Spondylosis with multi-level ankylosis.
IMPRESSION: 1. Stable from 8081.
2. Chronic bronchitic markings.

## 2020-10-10 ENCOUNTER — Other Ambulatory Visit: Payer: Self-pay | Admitting: Family Medicine

## 2020-10-10 DIAGNOSIS — Z1231 Encounter for screening mammogram for malignant neoplasm of breast: Secondary | ICD-10-CM

## 2020-10-29 ENCOUNTER — Other Ambulatory Visit: Payer: Self-pay | Admitting: *Deleted

## 2020-10-29 ENCOUNTER — Telehealth (INDEPENDENT_AMBULATORY_CARE_PROVIDER_SITE_OTHER): Payer: Self-pay | Admitting: Family Medicine

## 2020-10-29 ENCOUNTER — Other Ambulatory Visit: Payer: Self-pay

## 2020-10-29 DIAGNOSIS — I10 Essential (primary) hypertension: Secondary | ICD-10-CM

## 2020-10-29 DIAGNOSIS — E785 Hyperlipidemia, unspecified: Secondary | ICD-10-CM

## 2020-10-29 DIAGNOSIS — I428 Other cardiomyopathies: Secondary | ICD-10-CM

## 2020-10-29 DIAGNOSIS — E559 Vitamin D deficiency, unspecified: Secondary | ICD-10-CM

## 2020-10-29 DIAGNOSIS — E041 Nontoxic single thyroid nodule: Secondary | ICD-10-CM

## 2020-10-29 DIAGNOSIS — I5042 Chronic combined systolic (congestive) and diastolic (congestive) heart failure: Secondary | ICD-10-CM

## 2020-10-29 NOTE — Patient Instructions (Signed)
F/U in office with MD in 5 months, call if you need me sooner  Please schedule and get your Covid Booster in mid February  Please reschedule your echo cardiogram, call the Cardiology office , if you need our help with this call back and let us know  You are referred for thyroid US , we will call with appointment   It is important that you exercise regularly at least 30 minutes 5 times a week. If you develop chest pain, have severe difficulty breathing, or feel very tired, stop exercising immediately and seek medical attention   Please work on reduced portion size and caloric intake so that you can lose weight and take some of the stress off of your knees  Please get fasting lipid, cmp and EGFR , CBC and vit D , 1 week before next appointment  Thanks for choosing [facility], we consider it a privelige to serve you.

## 2020-10-29 NOTE — Progress Notes (Signed)
Virtual Visit via Telephone Note  I connected with Michelle David on 10/29/20 at  1:00 PM EST by telephone and verified that I am speaking with the correct person using two identifiers.  Location: Patient: home Provider: work   I discussed the limitations, risks, security and privacy concerns of performing an evaluation and management service by telephone and the availability of in person appointments. I also discussed with the patient that there may be a patient responsible charge related to this service. The patient expressed understanding and agreed to proceed.   History of Present Illness: C/o difficulty swallong and tightness in her throat, concerned that her thyroid nodule may be getting bigger Denies recent fever or chills. Denies sinus pressure, nasal congestion, ear pain or sore throat. Denies chest congestion, productive cough or wheezing. C/o SOB with activity, missed cardiology appointment and needs to reschedule her echo. Denies abdominal pain, nausea, vomiting,diarrhea or constipation.   Denies dysuria, frequency, hesitancy or incontinence. Denies joint pain, swelling and limitation in mobility. Denies headaches, seizures, numbness, or tingling. Denies depression, anxiety or insomnia. Denies skin break down or rash.       Observations/Objective: There were no vitals taken for this visit. Good communication with no confusion and intact memory. Alert and oriented x 3 No signs of respiratory distress during speech    Assessment and Plan: Chronic combined systolic and diastolic CHF (congestive heart failure) (HCC) Reports exertional fatigue, importance of getting echo cardiogram is stressed  HTN (hypertension), malignant DASH diet and commitment to daily physical activity for a minimum of 30 minutes discussed and encouraged, as a part of hypertension management. The importance of attaining a healthy weight is also discussed.  BP/Weight 08/15/2020 06/04/2020  04/03/2020 03/02/2020 01/19/2020 11/14/2019 80/16/5537  Systolic BP 482 - 707 - 867 544 920  Diastolic BP 86 - 92 - 90 82 84  Wt. (Lbs) 240 239 239 230 234.8 236.04 230  BMI 42.51 43.71 43.71 42.07 42.95 43.17 42.07     In office eval needed to detrmine control, not well controlled from record review  Hyperlipidemia LDL goal <100 Hyperlipidemia:Low fat diet discussed and encouraged.   Lipid Panel  Lab Results  Component Value Date   CHOL 186 08/17/2020   HDL 45 (L) 08/17/2020   LDLCALC 122 (H) 08/17/2020   TRIG 91 08/17/2020   CHOLHDL 4.1 08/17/2020     Updated lab needed at/ before next visit.   Morbid obesity  Patient re-educated about  the importance of commitment to a  minimum of 150 minutes of exercise per week as able.  The importance of healthy food choices with portion control discussed, as well as eating regularly and within a 12 hour window most days. The need to choose "clean , green" food 50 to 75% of the time is discussed, as well as to make water the primary drink and set a goal of 64 ounces water daily.    Weight /BMI 08/15/2020 06/04/2020 04/03/2020  WEIGHT 240 lb 239 lb 239 lb  HEIGHT 5\' 3"  5\' 2"  5\' 2"   BMI 42.51 kg/m2 43.71 kg/m2 43.71 kg/m2      Thyroid nodule C/o increased hoarseness, thyroid US to further evaluate     Follow Up Instructions:    I discussed the assessment and treatment plan with the patient. The patient was provided an opportunity to ask questions and all were answered. The patient agreed with the plan and demonstrated an understanding of the instructions.   The patient was advised to call  back or seek an in-person evaluation if the symptoms worsen or if the condition fails to improve as anticipated.  I provided 20 minutes of non-face-to-face time during this encounter.   Tula Nakayama, MD

## 2020-10-30 ENCOUNTER — Other Ambulatory Visit: Payer: Self-pay

## 2020-10-30 ENCOUNTER — Ambulatory Visit (HOSPITAL_COMMUNITY)
Admission: RE | Admit: 2020-10-30 | Discharge: 2020-10-30 | Disposition: A | Discharge status: Home / Self Care | Payer: Self-pay | Source: Ambulatory Visit | Attending: Cardiology | Admitting: Cardiology

## 2020-10-30 DIAGNOSIS — I428 Other cardiomyopathies: Secondary | ICD-10-CM | POA: Insufficient documentation

## 2020-10-30 LAB — ECHOCARDIOGRAM COMPLETE
Area-P 1/2: 3.5 cm2
Calc EF: 43.8 %
MV M vel: 5.45 m/s
MV Peak grad: 118.8 mmHg
S' Lateral: 3.3 cm
Single Plane A2C EF: 45 %
Single Plane A4C EF: 44.4 %

## 2020-10-30 NOTE — Progress Notes (Signed)
*  PRELIMINARY RESULTS* Echocardiogram 2D Echocardiogram has been performed.  Michelle David 10/30/2020, 4:00 PM

## 2020-10-31 ENCOUNTER — Encounter: Payer: Self-pay | Admitting: Family Medicine

## 2020-10-31 NOTE — Assessment & Plan Note (Signed)
Reports exertional fatigue, importance of getting echo cardiogram is stressed

## 2020-10-31 NOTE — Assessment & Plan Note (Signed)
C/o increased hoarseness, thyroid US to further evaluate

## 2020-10-31 NOTE — Assessment & Plan Note (Signed)
Hyperlipidemia:Low fat diet discussed and encouraged.   Lipid Panel  Lab Results  Component Value Date   CHOL 186 08/17/2020   HDL 45 (L) 08/17/2020   LDLCALC 122 (H) 08/17/2020   TRIG 91 08/17/2020   CHOLHDL 4.1 08/17/2020     Updated lab needed at/ before next visit.

## 2020-10-31 NOTE — Assessment & Plan Note (Signed)
  Patient re-educated about  the importance of commitment to a  minimum of 150 minutes of exercise per week as able.  The importance of healthy food choices with portion control discussed, as well as eating regularly and within a 12 hour window most days. The need to choose "clean , green" food 50 to 75% of the time is discussed, as well as to make water the primary drink and set a goal of 64 ounces water daily.    Weight /BMI 08/15/2020 06/04/2020 04/03/2020  WEIGHT 240 lb 239 lb 239 lb  HEIGHT 5' 3" 5' 2" 5' 2"  BMI 42.51 kg/m2 43.71 kg/m2 43.71 kg/m2     

## 2020-10-31 NOTE — Assessment & Plan Note (Signed)
DASH diet and commitment to daily physical activity for a minimum of 30 minutes discussed and encouraged, as a part of hypertension management. The importance of attaining a healthy weight is also discussed.  BP/Weight 08/15/2020 06/04/2020 04/03/2020 03/02/2020 01/19/2020 11/14/2019 08/13/8526  Systolic BP 782 - 423 - 536 144 315  Diastolic BP 86 - 92 - 90 82 84  Wt. (Lbs) 240 239 239 230 234.8 236.04 230  BMI 42.51 43.71 43.71 42.07 42.95 43.17 42.07     In office eval needed to detrmine control, not well controlled from record review

## 2020-11-02 ENCOUNTER — Ambulatory Visit (HOSPITAL_COMMUNITY): Payer: Self-pay

## 2020-11-19 ENCOUNTER — Ambulatory Visit (HOSPITAL_COMMUNITY): Payer: Self-pay

## 2020-11-22 ENCOUNTER — Ambulatory Visit (HOSPITAL_COMMUNITY)
Admission: RE | Admit: 2020-11-22 | Discharge: 2020-11-22 | Disposition: A | Discharge status: Home / Self Care | Payer: 59 | Source: Ambulatory Visit | Attending: Family Medicine | Admitting: Family Medicine

## 2020-11-22 ENCOUNTER — Other Ambulatory Visit: Payer: Self-pay

## 2020-11-22 DIAGNOSIS — E041 Nontoxic single thyroid nodule: Secondary | ICD-10-CM | POA: Diagnosis present

## 2021-01-14 ENCOUNTER — Ambulatory Visit: Payer: 59 | Admitting: Family Medicine

## 2021-01-21 ENCOUNTER — Other Ambulatory Visit: Payer: Self-pay | Admitting: Family Medicine

## 2021-01-21 ENCOUNTER — Other Ambulatory Visit: Payer: Self-pay | Admitting: Cardiology

## 2021-04-02 ENCOUNTER — Ambulatory Visit: Payer: 59 | Admitting: Student

## 2021-04-15 ENCOUNTER — Encounter: Payer: Self-pay | Admitting: Family Medicine

## 2021-04-15 ENCOUNTER — Other Ambulatory Visit: Payer: Self-pay

## 2021-04-15 ENCOUNTER — Ambulatory Visit (INDEPENDENT_AMBULATORY_CARE_PROVIDER_SITE_OTHER): Payer: Medicare Other | Admitting: Family Medicine

## 2021-04-15 ENCOUNTER — Other Ambulatory Visit (HOSPITAL_COMMUNITY): Payer: Self-pay | Admitting: Family Medicine

## 2021-04-15 VITALS — BP 157/89 | HR 79 | Resp 15 | Ht 63.0 in | Wt 237.0 lb

## 2021-04-15 DIAGNOSIS — I10 Essential (primary) hypertension: Secondary | ICD-10-CM | POA: Diagnosis not present

## 2021-04-15 DIAGNOSIS — J309 Allergic rhinitis, unspecified: Secondary | ICD-10-CM | POA: Diagnosis not present

## 2021-04-15 DIAGNOSIS — Z1231 Encounter for screening mammogram for malignant neoplasm of breast: Secondary | ICD-10-CM

## 2021-04-15 DIAGNOSIS — E785 Hyperlipidemia, unspecified: Secondary | ICD-10-CM

## 2021-04-15 MED ORDER — FUROSEMIDE 20 MG PO TABS: 20.0000 mg | ORAL_TABLET | Freq: Every day | ORAL | 1 refills | Status: DC

## 2021-04-15 MED ORDER — CARVEDILOL 25 MG PO TABS: 25.0000 mg | ORAL_TABLET | Freq: Two times a day (BID) | ORAL | 1 refills | Status: DC

## 2021-04-15 MED ORDER — FLUTICASONE PROPIONATE 50 MCG/ACT NA SUSP: 2.0000 | Freq: Every day | NASAL | 6 refills | Status: AC

## 2021-04-15 MED ORDER — LOSARTAN POTASSIUM 100 MG PO TABS: 100.0000 mg | ORAL_TABLET | Freq: Every day | ORAL | 0 refills | Status: DC

## 2021-04-15 MED ORDER — ATORVASTATIN CALCIUM 10 MG PO TABS: 10.0000 mg | ORAL_TABLET | Freq: Every day | ORAL | 1 refills | Status: DC

## 2021-04-15 NOTE — Patient Instructions (Addendum)
Keep wellness appointment scheduled  You need  your covid booster, please get at your pharmacy  Please schedule mammogram at checkout  Labs fasting as sooon as possible that  were ordered in January  Pneumonia 23 vaccine today  Losartan and nose spray are prescribed  Think about what you will eat, plan ahead. Choose " clean, green, fresh or frozen" over canned, processed or packaged foods which are more sugary, salty and fatty. 70 to 75% of food eaten should be vegetables and fruit. Three meals at set times with snacks allowed between meals, but they must be fruit or vegetables. Aim to eat over a 12 hour period , example 7 am to 7 pm, and STOP after  your last meal of the day. Drink water,generally about 64 ounces per day, no other drink is as healthy. Fruit juice is best enjoyed in a healthy way, by EATING the fruit. Thanks for choosing [facility], we consider it a privelige to serve you.

## 2021-04-16 LAB — LIPID PANEL
Chol/HDL Ratio: 3.9 ratio (ref 0.0–4.4)
Cholesterol, Total: 164 mg/dL (ref 100–199)
HDL: 42 mg/dL (ref >39)
LDL Chol Calc (NIH): 105 mg/dL — ABNORMAL HIGH (ref 0–99)
Triglycerides: 92 mg/dL (ref 0–149)
VLDL Cholesterol Cal: 17 mg/dL (ref 5–40)

## 2021-04-16 LAB — CMP14+EGFR
ALT: 31 IU/L (ref 0–32)
AST: 28 IU/L (ref 0–40)
Albumin/Globulin Ratio: 0.9 — ABNORMAL LOW (ref 1.2–2.2)
Albumin: 3.8 g/dL (ref 3.8–4.9)
Alkaline Phosphatase: 104 IU/L (ref 44–121)
BUN/Creatinine Ratio: 13 (ref 9–23)
BUN: 12 mg/dL (ref 6–24)
Bilirubin Total: 0.3 mg/dL (ref 0.0–1.2)
CO2: 26 mmol/L (ref 20–29)
Calcium: 8.9 mg/dL (ref 8.7–10.2)
Chloride: 105 mmol/L (ref 96–106)
Creatinine, Ser: 0.96 mg/dL (ref 0.57–1.00)
Globulin, Total: 4.1 g/dL (ref 1.5–4.5)
Glucose: 84 mg/dL (ref 65–99)
Potassium: 4.2 mmol/L (ref 3.5–5.2)
Sodium: 145 mmol/L — ABNORMAL HIGH (ref 134–144)
Total Protein: 7.9 g/dL (ref 6.0–8.5)
eGFR: 69 mL/min/{1.73_m2} (ref >59)

## 2021-04-16 LAB — CBC
Hematocrit: 38 % (ref 34.0–46.6)
Hemoglobin: 12.5 g/dL (ref 11.1–15.9)
MCH: 29.8 pg (ref 26.6–33.0)
MCHC: 32.9 g/dL (ref 31.5–35.7)
MCV: 91 fL (ref 79–97)
Platelets: 365 10*3/uL (ref 150–450)
RBC: 4.2 x10E6/uL (ref 3.77–5.28)
RDW: 13.9 % (ref 11.7–15.4)
WBC: 4.7 10*3/uL (ref 3.4–10.8)

## 2021-04-16 LAB — VITAMIN D 25 HYDROXY (VIT D DEFICIENCY, FRACTURES): Vit D, 25-Hydroxy: 8.7 ng/mL — ABNORMAL LOW (ref 30.0–100.0)

## 2021-04-16 MED ORDER — ERGOCALCIFEROL 1.25 MG (50000 UT) PO CAPS: 50000.0000 [IU] | ORAL_CAPSULE | ORAL | 2 refills | Status: DC

## 2021-04-16 NOTE — Progress Notes (Signed)
Michelle David     MRN: [id number]      DOB: [birth year]   HPI [name] is here for follow up and re-evaluation of chronic medical conditions, medication management and review of any available recent lab and radiology data.  Preventive health is updated, specifically  Cancer screening and Immunization.   Questions or concerns regarding consultations or procedures which the PT has had in the interim are  addressed. The PT denies any adverse reactions to current medications since the last visit.  There are no new concerns.  There are no specific complaints   ROS Denies recent fever or chills. C/o  sinus pressure and  nasal congestion, ear pain or sore throat. Denies chest congestion, productive cough or wheezing. Denies chest pains, palpitations and leg swelling Denies abdominal pain, nausea, vomiting,diarrhea or constipation.   Denies dysuria, frequency, hesitancy or incontinence. Denies joint pain, swelling and limitation in mobility. Denies headaches, seizures, numbness, or tingling. Denies depression, anxiety or insomnia. Denies skin break down or rash.   PE  BP (!) 157/89   Pulse 79   Resp 15   Ht 5\' 3"  (1.6 m)   Wt 237 lb (107.5 kg)   SpO2 92%   BMI 41.98 kg/m   Patient alert and oriented and in no cardiopulmonary distress.  HEENT: No facial asymmetry, EOMI,     Neck supple .  Chest: Clear to auscultation bilaterally.  CVS: S1, S2 no murmurs, no S3.Regular rate.  ABD: Soft non tender.   Ext: No edema  MS: Adequate ROM spine, shoulders, hips and knees.  Skin: Intact, no ulcerations or rash noted.  Psych: Good eye contact, normal affect. Memory intact not anxious or depressed appearing.  CNS: CN 2-12 intact, power,  normal throughout.no focal deficits noted.   Assessment & Plan  Allergic rhinitis Uncontrolled additional medication to be started on a daily  basis  HTN (hypertension), malignant Uncontrolled reports not having one of her meds,  same is prescribed DASH diet and commitment to daily physical activity for a minimum of 30 minutes discussed and encouraged, as a part of hypertension management. The importance of attaining a healthy weight is also discussed.  BP/Weight 04/15/2021 08/15/2020 06/04/2020 04/03/2020 03/02/2020 01/19/2020 04/15/349  Systolic BP 093 818 - 299 - 371 696  Diastolic BP 89 86 - 92 - 90 82  Wt. (Lbs) 237 240 239 239 230 234.8 236.04  BMI 41.98 42.51 43.71 43.71 42.07 42.95 43.17       Hyperlipidemia LDL goal <100 Hyperlipidemia:Low fat diet discussed and encouraged.   Lipid Panel  Lab Results  Component Value Date   CHOL 164 04/15/2021   HDL 42 04/15/2021   LDLCALC 105 (H) 04/15/2021   TRIG 92 04/15/2021   CHOLHDL 3.9 04/15/2021     needs to reduce fat in diet, no med change  Morbid obesity  Patient re-educated about  the importance of commitment to a  minimum of 150 minutes of exercise per week as able.  The importance of healthy food choices with portion control discussed, as well as eating regularly and within a 12 hour window most days. The need to choose "clean , green" food 50 to 75% of the time is discussed, as well as to make water the primary drink and set a goal of 64 ounces water daily.    Weight /BMI 04/15/2021 08/15/2020 06/04/2020  WEIGHT 237 lb 240 lb 239 lb  HEIGHT 5\' 3"  5\' 3"  5\' 2"   BMI 41.98 kg/m2  42.51 kg/m2 43.71 kg/m2

## 2021-04-21 ENCOUNTER — Encounter: Payer: Self-pay | Admitting: Family Medicine

## 2021-04-21 NOTE — Assessment & Plan Note (Signed)
Hyperlipidemia:Low fat diet discussed and encouraged.   Lipid Panel  Lab Results  Component Value Date   CHOL 164 04/15/2021   HDL 42 04/15/2021   LDLCALC 105 (H) 04/15/2021   TRIG 92 04/15/2021   CHOLHDL 3.9 04/15/2021     needs to reduce fat in diet, no med change

## 2021-04-21 NOTE — Assessment & Plan Note (Signed)
Uncontrolled reports not having one of her meds, same is prescribed DASH diet and commitment to daily physical activity for a minimum of 30 minutes discussed and encouraged, as a part of hypertension management. The importance of attaining a healthy weight is also discussed.  BP/Weight 04/15/2021 08/15/2020 06/04/2020 04/03/2020 03/02/2020 01/19/2020 6/50/3546  Systolic BP 568 127 - 517 - 001 749  Diastolic BP 89 86 - 92 - 90 82  Wt. (Lbs) 237 240 239 239 230 234.8 236.04  BMI 41.98 42.51 43.71 43.71 42.07 42.95 43.17

## 2021-04-21 NOTE — Assessment & Plan Note (Signed)
  Patient re-educated about  the importance of commitment to a  minimum of 150 minutes of exercise per week as able.  The importance of healthy food choices with portion control discussed, as well as eating regularly and within a 12 hour window most days. The need to choose "clean , green" food 50 to 75% of the time is discussed, as well as to make water the primary drink and set a goal of 64 ounces water daily.    Weight /BMI 04/15/2021 08/15/2020 06/04/2020  WEIGHT 237 lb 240 lb 239 lb  HEIGHT 5\' 3"  5\' 3"  5\' 2"   BMI 41.98 kg/m2 42.51 kg/m2 43.71 kg/m2

## 2021-04-21 NOTE — Assessment & Plan Note (Signed)
Uncontrolled additional medication to be started on a daily  basis

## 2021-04-25 ENCOUNTER — Ambulatory Visit (HOSPITAL_COMMUNITY): Payer: Medicare Other

## 2021-05-09 ENCOUNTER — Encounter: Payer: Medicare Other | Admitting: Family Medicine

## 2021-05-20 ENCOUNTER — Ambulatory Visit (HOSPITAL_COMMUNITY)
Admission: RE | Admit: 2021-05-20 | Discharge: 2021-05-20 | Disposition: A | Discharge status: Home / Self Care | Payer: Medicare Other | Source: Ambulatory Visit | Attending: Family Medicine | Admitting: Family Medicine

## 2021-05-20 ENCOUNTER — Other Ambulatory Visit: Payer: Self-pay

## 2021-05-20 DIAGNOSIS — Z1231 Encounter for screening mammogram for malignant neoplasm of breast: Secondary | ICD-10-CM

## 2021-05-22 ENCOUNTER — Encounter: Payer: Self-pay | Admitting: Student

## 2021-05-22 ENCOUNTER — Ambulatory Visit (INDEPENDENT_AMBULATORY_CARE_PROVIDER_SITE_OTHER): Payer: Medicare Other | Admitting: Student

## 2021-05-22 ENCOUNTER — Other Ambulatory Visit: Payer: Self-pay

## 2021-05-22 VITALS — BP 140/70 | HR 78 | Ht 62.0 in | Wt 236.0 lb

## 2021-05-22 DIAGNOSIS — G4730 Sleep apnea, unspecified: Secondary | ICD-10-CM | POA: Diagnosis not present

## 2021-05-22 DIAGNOSIS — E782 Mixed hyperlipidemia: Secondary | ICD-10-CM | POA: Diagnosis not present

## 2021-05-22 DIAGNOSIS — I10 Essential (primary) hypertension: Secondary | ICD-10-CM | POA: Diagnosis not present

## 2021-05-22 DIAGNOSIS — I428 Other cardiomyopathies: Secondary | ICD-10-CM | POA: Diagnosis not present

## 2021-05-22 DIAGNOSIS — I340 Nonrheumatic mitral (valve) insufficiency: Secondary | ICD-10-CM

## 2021-05-22 NOTE — Progress Notes (Signed)
Cardiology Office Note    Date:  05/22/2021   ID:  Bineta, Coffing 1961-10-02, MRN [id number]  PCP:  Fayrene Helper, MD  Cardiologist: Rozann Lesches, MD    Chief Complaint  Patient presents with   Follow-up    History of Present Illness:    Michelle David is a 60 y.o. female with past medical history of presumed NICM (EF 30-35% in 2018, at 45-50% by repeat imaging in 2019), HTN and HLD who presents to the office today for overdue follow-up.  She most recently had a telehealth visit with Dr. Domenic Polite in 02/2020 and denied any changes in her respiratory status or associated chest pain. It was recommended that she have a follow-up echocardiogram in 6 months with office follow-up afterwards. She did have a repeat echocardiogram in 10/2020 which showed her EF had improved to 50 to 55% with mild MR.   In talking with the patient today, she reports having baseline dyspnea on exertion for the past few years but feels like her symptoms have improved over time as she previously required supplemental oxygen and has not used this in over a year. She feels like her weight gain over the past few years has possibly contributed to her symptoms as well. She denies a specific orthopnea or PND but does snore routinely and also reports daytime somnolence. She denies any exertional chest pain or palpitations. No recent lower extremity edema.   Past Medical History:  Diagnosis Date   Cardiomyopathy [facility])    Diagnosed March 2018   CHF (congestive heart failure) ([place])    a. EF 30-35% by echo in 12/2016 b. EF improved to 45-50% in 07/2018 c. 10/2020: EF improved to 50-55%.   Dyspepsia    Essential hypertension    Hyperlipidemia    Hypoxia 11/01/2018   Impaired glucose tolerance    Multifocal pneumonia 11/01/2018   Obesity    Snoring 01/22/2018   Solitary thyroid nodule 03/01/2015   Dominant left thyroid nodule 3.2 cm noted in 02/2015     Past Surgical History:  Procedure  Laterality Date   COLONOSCOPY N/A 03/19/2018   Procedure: COLONOSCOPY;  Surgeon: Danie Binder, MD;  Location: [facility];  Service: Endoscopy;  Laterality: N/A;  2:00   OOPHORECTOMY     TUBAL LIGATION      Current Medications: Outpatient Medications Prior to Visit  Medication Sig Dispense Refill   acetaminophen (TYLENOL) 500 MG tablet Take one tablet by mouth two times daily, as needed, for arthritis pain 30 tablet 0   atorvastatin (LIPITOR) 10 MG tablet Take 1 tablet (10 mg total) by mouth daily. 90 tablet 1   carvedilol (COREG) 25 MG tablet Take 1 tablet (25 mg total) by mouth 2 (two) times daily with a meal. 180 tablet 1   ergocalciferol (VITAMIN D2) 1.25 MG (50000 UT) capsule Take 1 capsule (50,000 Units total) by mouth once a week. One capsule once weekly 12 capsule 2   fluticasone (FLONASE) 50 MCG/ACT nasal spray Place 2 sprays into both nostrils daily. 16 g 6   furosemide (LASIX) 20 MG tablet Take 1 tablet (20 mg total) by mouth daily. 90 tablet 1   losartan (COZAAR) 100 MG tablet Take 1 tablet (100 mg total) by mouth daily. 90 tablet 0   No facility-administered medications prior to visit.     Allergies:   Patient has no known allergies.   Social History   Socioeconomic History   Marital status: Married  Spouse name: Not on file   Number of children: Not on file   Years of education: Not on file   Highest education level: Not on file  Occupational History   Occupation: [place]    Employer: [employer]  Tobacco Use   Smoking status: Never   Smokeless tobacco: Never  Vaping Use   Vaping Use: Never used  Substance and Sexual Activity   Alcohol use: Not Currently    Comment: special occasions   Drug use: No   Sexual activity: Not Currently  Other Topics Concern   Not on file  Social History Narrative   Not on file   Social Determinants of Health   Financial Resource Strain: Not on file  Food Insecurity: Not on file  Transportation Needs:  Not on file  Physical Activity: Not on file  Stress: Not on file  Social Connections: Not on file     Family History:  The patient's family history includes Heart disease in her mother; Hypertension in her mother; Lung cancer in her maternal aunt.   Review of Systems:    Please see the history of present illness.     All other systems reviewed and are otherwise negative except as noted above.   Physical Exam:    VS:  BP 140/70   Pulse 78   Ht '5\' 2"'$  (1.575 m)   Wt 236 lb (107 kg)   SpO2 96%   BMI 43.16 kg/m    General: Pleasant, obese female appearing in no acute distress. Head: Normocephalic, atraumatic. Neck: No carotid bruits. JVD not elevated.  Lungs: Respirations regular and unlabored, without wheezes or rales.  Heart: Regular rate and rhythm. No S3 or S4.  No murmur, no rubs, or gallops appreciated. Abdomen: Appears non-distended. No obvious abdominal masses. Msk:  Strength and tone appear normal for age. No obvious joint deformities or effusions. Extremities: No clubbing or cyanosis. No pitting edema.  Distal pedal pulses are 2+ bilaterally. Neuro: Alert and oriented X 3. Moves all extremities spontaneously. No focal deficits noted. Psych:  Responds to questions appropriately with a normal affect. Skin: No rashes or lesions noted  Wt Readings from Last 3 Encounters:  05/22/21 236 lb (107 kg)  04/15/21 237 lb (107.5 kg)  08/15/20 240 lb (108.9 kg)      Studies/Labs Reviewed:   EKG:  EKG is ordered today. The ekg ordered today demonstrates NSR, HR 73 with LVH and associated repol abnormalities with TWI along the inferior and lateral leads which is similar to prior images dating back to 2016 but more prominent.  Recent Labs: 08/17/2020: TSH 1.68 04/15/2021: ALT 31; BUN 12; Creatinine, Ser 0.96; Hemoglobin 12.5; Platelets 365; Potassium 4.2; Sodium 145   Lipid Panel    Component Value Date/Time   CHOL 164 04/15/2021 1404   TRIG 92 04/15/2021 1404   HDL 42  04/15/2021 1404   CHOLHDL 3.9 04/15/2021 1404   CHOLHDL 4.1 08/17/2020 1332   VLDL 26 01/15/2017 1816   LDLCALC 105 (H) 04/15/2021 1404   LDLCALC 122 (H) 08/17/2020 1332    Additional studies/ records that were reviewed today include:   Echocardiogram: 10/30/2020 IMPRESSIONS     1. Left ventricular ejection fraction, by estimation, is 50 to 55%. The  left ventricle has low normal function. The left ventricle has no regional  wall motion abnormalities. There is mild left ventricular hypertrophy.  Left ventricular diastolic  parameters are indeterminate.   2. Right ventricular systolic function is normal. The  right ventricular  size is normal. Tricuspid regurgitation signal is inadequate for assessing  PA pressure.   3. The mitral valve is grossly normal. Mild mitral valve regurgitation.   4. The aortic valve is tricuspid. Aortic valve regurgitation is trivial.   5. The inferior vena cava is normal in size with greater than 50%  respiratory variability, suggesting right atrial pressure of 3 mmHg.   Assessment:    1. Nonischemic cardiomyopathy ([place])   2. Essential hypertension   3. Mixed hyperlipidemia   4. Mitral valve insufficiency, unspecified etiology   5. Sleep-disordered breathing      Plan:   In order of problems listed above:  1. HFimpEF - Her EF was previously at 30-35% in 2018, at 45-50% by repeat imaging in 2019 and most recently at 50-55% by most recent echo in 10/2020. - She does have baseline dyspnea on exertion but reports this has improved over the past few years. Reports coughing while walking but denies any associated exertional chest pain or palpitations. She does have a baseline abnormal EKG but it is similar to prior tracings. We discussed a possible stress test and I encouraged her to reach out if she wishes to pursue this or has any progression of symptoms as her last ischemic evaluation was in 2012. - Continue Coreg, Losartan and Lasix at current  dosing. Recent labs in 03/2021 showed her creatinine was stable at 0.96 and K+ was normal at 4.2.  2. HTN - BP was initially elevated to 140/70, rechecked and at 132/72. Continue current medication regimen with Coreg '25mg'$  BID and Losartan '100mg'$  daily.   3. HLD - Followed by her PCP. She remains on Atorvastatin '10mg'$  daily.   4. Mitral Regurgitation - Mild by echo in 10/2020. Will continue to follow.   5. Sleep-Disordered Breathing - She has been told by multiple family members that she snores and also reports daytime somnolence. I did recommend undergoing a sleep study but she wishes to hold off on further testing at this time. I encouraged her to call back if she wishes to proceed as we could place a referral to [facility].     Medication Adjustments/Labs and Tests Ordered: Current medicines are reviewed at length with the patient today.  Concerns regarding medicines are outlined above.  Medication changes, Labs and Tests ordered today are listed in the Patient Instructions below. Patient Instructions  Medication Instructions:  Your physician recommends that you continue on your current medications as directed. Please refer to the Current Medication list given to you today.  Call our office if you decide on having the sleep study.   *If you need a refill on your cardiac medications before your next appointment, please call your pharmacy*   Lab Work: NONE   If you have labs (blood work) drawn today and your tests are completely normal, you will receive your results only by: [employer] (if you have MyChart) OR A paper copy in the mail If you have any lab test that is abnormal or we need to change your treatment, we will call you to review the results.   Testing/Procedures: NONE    Follow-Up: At [facility], you and your health needs are our priority.  As part of our continuing mission to provide you with exceptional heart care, we have created designated  Provider Care Teams.  These Care Teams include your primary Cardiologist (physician) and Advanced Practice Providers (APPs -  Physician Assistants and Nurse Practitioners) who all work together to provide  you with the care you need, when you need it.  We recommend signing up for the patient portal called "MyChart".  Sign up information is provided on this After Visit Summary.  MyChart is used to connect with patients for Virtual Visits (Telemedicine).  Patients are able to view lab/test results, encounter notes, upcoming appointments, etc.  Non-urgent messages can be sent to your provider as well.   To learn more about what you can do with MyChart, go to [email or web address].    Your next appointment:   6 month(s)  The format for your next appointment:   In Person  Provider:   Rozann Lesches, MD or Bernerd Pho, PA-C   Other Instructions Thank you for choosing [facility]!     Signed, Erma Heritage, PA-C  05/22/2021 5:31 PM    [place] [address] [place],  [address] Phone: [phone] Fax: [phone]

## 2021-05-22 NOTE — Patient Instructions (Signed)
Medication Instructions:  Your physician recommends that you continue on your current medications as directed. Please refer to the Current Medication list given to you today.  Call our office if you decide on having the sleep study.   *If you need a refill on your cardiac medications before your next appointment, please call your pharmacy*   Lab Work: NONE   If you have labs (blood work) drawn today and your tests are completely normal, you will receive your results only by: [place] (if you have MyChart) OR A paper copy in the mail If you have any lab test that is abnormal or we need to change your treatment, we will call you to review the results.   Testing/Procedures: NONE    Follow-Up: At [facility], you and your health needs are our priority.  As part of our continuing mission to provide you with exceptional heart care, we have created designated Provider Care Teams.  These Care Teams include your primary Cardiologist (physician) and Advanced Practice Providers (APPs -  Physician Assistants and Nurse Practitioners) who all work together to provide you with the care you need, when you need it.  We recommend signing up for the patient portal called "MyChart".  Sign up information is provided on this After Visit Summary.  MyChart is used to connect with patients for Virtual Visits (Telemedicine).  Patients are able to view lab/test results, encounter notes, upcoming appointments, etc.  Non-urgent messages can be sent to your provider as well.   To learn more about what you can do with MyChart, go to [email or web address].    Your next appointment:   6 month(s)  The format for your next appointment:   In Person  Provider:   Rozann Lesches, MD or Bernerd Pho, PA-C   Other Instructions Thank you for choosing [facility]!

## 2021-07-05 ENCOUNTER — Other Ambulatory Visit: Payer: Self-pay

## 2021-07-05 ENCOUNTER — Ambulatory Visit (INDEPENDENT_AMBULATORY_CARE_PROVIDER_SITE_OTHER): Payer: Medicare Other | Admitting: Family Medicine

## 2021-07-05 ENCOUNTER — Encounter: Payer: Self-pay | Admitting: Family Medicine

## 2021-07-05 VITALS — BP 160/92 | HR 70 | Temp 97.8°F | Resp 18 | Ht 62.0 in | Wt 234.0 lb

## 2021-07-05 DIAGNOSIS — Z23 Encounter for immunization: Secondary | ICD-10-CM

## 2021-07-05 DIAGNOSIS — E041 Nontoxic single thyroid nodule: Secondary | ICD-10-CM

## 2021-07-05 DIAGNOSIS — I5042 Chronic combined systolic (congestive) and diastolic (congestive) heart failure: Secondary | ICD-10-CM

## 2021-07-05 DIAGNOSIS — I10 Essential (primary) hypertension: Secondary | ICD-10-CM | POA: Diagnosis not present

## 2021-07-05 DIAGNOSIS — L659 Nonscarring hair loss, unspecified: Secondary | ICD-10-CM | POA: Diagnosis not present

## 2021-07-05 DIAGNOSIS — F5105 Insomnia due to other mental disorder: Secondary | ICD-10-CM

## 2021-07-05 DIAGNOSIS — E785 Hyperlipidemia, unspecified: Secondary | ICD-10-CM | POA: Diagnosis not present

## 2021-07-05 DIAGNOSIS — F409 Phobic anxiety disorder, unspecified: Secondary | ICD-10-CM

## 2021-07-05 NOTE — Progress Notes (Signed)
Michelle David     MRN: [id number]      DOB: September 20, 1961   HPI [name] is here for follow up and re-evaluation of chronic medical conditions, medication management and review of any available recent lab and radiology data.  Preventive health is updated, specifically  Cancer screening and Immunization.   Questions or concerns regarding consultations or procedures which the PT has had in the interim are  addressed. The PT denies any adverse reactions to current medications since the last visit.  C/o hair loss C/o anxiety, depression and poor sleep   ROS Denies recent fever or chills. Denies sinus pressure, nasal congestion, ear pain or sore throat. Denies chest congestion, productive cough or wheezing. Denies chest pains, palpitations and leg swelling Denies abdominal pain, nausea, vomiting,diarrhea or constipation.   Denies dysuria, frequency, hesitancy or incontinence. Denies joint pain, swelling and limitation in mobility. Denies headaches, seizures, numbness, or tingling. . Denies skin break down or rash.   PE  BP (!) 153/79 (BP Location: Right Arm, Patient Position: Sitting, Cuff Size: Large)   Pulse 70   Temp 97.8 F (36.6 C)   Resp 18   Ht '5\' 2"'$  (1.575 m)   Wt 234 lb (106.1 kg)   SpO2 92%   BMI 42.80 kg/m   Patient alert and oriented and in no cardiopulmonary distress.  HEENT: No facial asymmetry, EOMI,     Neck supple .  Chest: Clear to auscultation bilaterally.  CVS: S1, S2  systolic  murmur, no S3.Regular rate.  ABD: Soft non tender.   Ext: No edema  MS: Adequate ROM spine, shoulders, hips and knees.  Skin: Intact, no ulcerations or rash noted.  Psych: Good eye contact, tearful affect. Memory intact anxious and  depressed appearing.  CNS: CN 2-12 intact, power,  normal throughout.no focal deficits noted.   Assessment & Plan  Insomnia due to anxiety and fear Sleep hygiene reviewed and written information offered also. Challenged because of  new stres and anxiety following unexpected loss of nephew, no med prescribed  Morbid obesity  Patient re-educated about  the importance of commitment to a  minimum of 150 minutes of exercise per week as able.  The importance of healthy food choices with portion control discussed, as well as eating regularly and within a 12 hour window most days. The need to choose "clean , green" food 50 to 75% of the time is discussed, as well as to make water the primary drink and set a goal of 64 ounces water daily.    Weight /BMI 07/05/2021 05/22/2021 04/15/2021  WEIGHT 234 lb 236 lb 237 lb  HEIGHT '5\' 2"'$  '5\' 2"'$  '5\' 3"'$   BMI 42.8 kg/m2 43.16 kg/m2 41.98 kg/m2      HTN (hypertension), malignant Uncontrolled not takin coreg as prescribed , educated about this DASH diet and commitment to daily physical activity for a minimum of 30 minutes discussed and encouraged, as a part of hypertension management. The importance of attaining a healthy weight is also discussed.  BP/Weight 07/05/2021 05/22/2021 04/15/2021 08/15/2020 06/04/2020 04/03/2020 [id number]  Systolic BP 0000000 [id number] 0000000 - 123456 -  Diastolic BP 92 70 89 86 - 92 -  Wt. (Lbs) 234 236 237 240 239 239 230  BMI 42.8 43.16 41.98 42.51 43.71 43.71 42.07       Chronic combined systolic and diastolic CHF (congestive heart failure) (HCC) Currently stable , no s/s of decompensation at his time  Alopecia Not addresse at this visit but  will refer to dermatology if pt interested

## 2021-07-05 NOTE — Patient Instructions (Addendum)
Annual exam in 2 to 3 months, re evaluate blood pressure also  Flu vaccine today  TSH, fasting lipid, cmp and  eGFR 5 to 7 days before follow up  NEED to take the coreg two times daily as prescribed, your blood pressure is too high   It is important that you exercise regularly at least 30 minutes 5 times a week. If you develop chest pain, have severe difficulty breathing, or feel very tired, stop exercising immediately and seek medical attention   Think about what you will eat, plan ahead. Choose " clean, green, fresh or frozen" over canned, processed or packaged foods which are more sugary, salty and fatty. 70 to 75% of food eaten should be vegetables and fruit. Three meals at set times with snacks allowed between meals, but they must be fruit or vegetables. Aim to eat over a 12 hour period , example 7 am to 7 pm, and STOP after  your last meal of the day. Drink water,generally about 64 ounces per day, no other drink is as healthy. Fruit juice is best enjoyed in a healthy way, by EATING the fruit.   Thanks for choosing [facility], we consider it a privelige to serve you.

## 2021-07-08 ENCOUNTER — Encounter: Payer: Self-pay | Admitting: Family Medicine

## 2021-07-08 DIAGNOSIS — L659 Nonscarring hair loss, unspecified: Secondary | ICD-10-CM

## 2021-07-08 DIAGNOSIS — F409 Phobic anxiety disorder, unspecified: Secondary | ICD-10-CM

## 2021-07-08 HISTORY — DX: Nonscarring hair loss, unspecified: L65.9

## 2021-07-08 HISTORY — DX: Insomnia due to other mental disorder: F40.9

## 2021-07-08 NOTE — Assessment & Plan Note (Signed)
Uncontrolled not takin coreg as prescribed , educated about this DASH diet and commitment to daily physical activity for a minimum of 30 minutes discussed and encouraged, as a part of hypertension management. The importance of attaining a healthy weight is also discussed.  BP/Weight 07/05/2021 05/22/2021 04/15/2021 08/15/2020 06/04/2020 04/03/2020 [id number]  Systolic BP 0000000 [id number] 0000000 - 123456 -  Diastolic BP 92 70 89 86 - 92 -  Wt. (Lbs) 234 236 237 240 239 239 230  BMI 42.8 43.16 41.98 42.51 43.71 43.71 42.07

## 2021-07-08 NOTE — Assessment & Plan Note (Signed)
  Patient re-educated about  the importance of commitment to a  minimum of 150 minutes of exercise per week as able.  The importance of healthy food choices with portion control discussed, as well as eating regularly and within a 12 hour window most days. The need to choose "clean , green" food 50 to 75% of the time is discussed, as well as to make water the primary drink and set a goal of 64 ounces water daily.    Weight /BMI 07/05/2021 05/22/2021 04/15/2021  WEIGHT 234 lb 236 lb 237 lb  HEIGHT '5\' 2"'$  '5\' 2"'$  '5\' 3"'$   BMI 42.8 kg/m2 43.16 kg/m2 41.98 kg/m2

## 2021-07-08 NOTE — Assessment & Plan Note (Signed)
Not addresse at this visit but will refer to dermatology if pt interested

## 2021-07-08 NOTE — Assessment & Plan Note (Signed)
Currently stable , no s/s of decompensation at his time

## 2021-07-08 NOTE — Assessment & Plan Note (Signed)
Sleep hygiene reviewed and written information offered also. Challenged because of new stres and anxiety following unexpected loss of nephew, no med prescribed

## 2021-07-29 ENCOUNTER — Other Ambulatory Visit: Payer: Self-pay

## 2021-07-29 ENCOUNTER — Ambulatory Visit (INDEPENDENT_AMBULATORY_CARE_PROVIDER_SITE_OTHER): Payer: Medicare Other | Admitting: Nurse Practitioner

## 2021-07-29 ENCOUNTER — Encounter: Payer: Self-pay | Admitting: Nurse Practitioner

## 2021-07-29 DIAGNOSIS — S161XXA Strain of muscle, fascia and tendon at neck level, initial encounter: Secondary | ICD-10-CM | POA: Insufficient documentation

## 2021-07-29 MED ORDER — TIZANIDINE HCL 4 MG PO TABS: 4.0000 mg | ORAL_TABLET | Freq: Four times a day (QID) | ORAL | 0 refills | Status: DC | PRN

## 2021-07-29 MED ORDER — IBUPROFEN 600 MG PO TABS: 600.0000 mg | ORAL_TABLET | Freq: Three times a day (TID) | ORAL | 0 refills | Status: DC | PRN

## 2021-07-29 NOTE — Progress Notes (Signed)
Acute Office Visit  Subjective:    Patient ID: Michelle David, female    DOB: December 01, 1960, 60 y.o.   MRN: [id number]  Chief Complaint  Patient presents with   Follow-up    Pressure when swallowing     HPI Patient is in today for tightness and pressure in her neck that started 2-3 months ago. She states it started like a little pain and eased away, but today she is having pressure.  No pain when eating. Her pain is on the left side of her neck. Her pain is worse when she lays in the bed in a certain way.  She rates her pain at 3/10. She denies mass, but states she may have some mild swelling on the left side of her neck.  Past Medical History:  Diagnosis Date   Cardiomyopathy [facility])    Diagnosed March 2018   CHF (congestive heart failure) ([place])    a. EF 30-35% by echo in 12/2016 b. EF improved to 45-50% in 07/2018 c. 10/2020: EF improved to 50-55%.   Dyspepsia    Essential hypertension    Hyperlipidemia    Hypoxia 11/01/2018   Impaired glucose tolerance    Multifocal pneumonia 11/01/2018   Obesity    Snoring 01/22/2018   Solitary thyroid nodule 03/01/2015   Dominant left thyroid nodule 3.2 cm noted in 02/2015     Past Surgical History:  Procedure Laterality Date   COLONOSCOPY N/A 03/19/2018   Procedure: COLONOSCOPY;  Surgeon: Danie Binder, MD;  [facility];  Service: Endoscopy;  Laterality: N/A;  2:00   OOPHORECTOMY     TUBAL LIGATION      Family History  Problem Relation Age of Onset   Heart disease Mother        Died in her 73s with MI   Hypertension Mother    Lung cancer Maternal Aunt     Social History   Socioeconomic History   Marital status: Married    Spouse name: Not on file   Number of children: Not on file   Years of education: Not on file   Highest education level: Not on file  Occupational History   Occupation: [employer]    Employer: [employer]  Tobacco Use   Smoking status: Never   Smokeless tobacco: Never   Vaping Use   Vaping Use: Never used  Substance and Sexual Activity   Alcohol use: Not Currently    Comment: special occasions   Drug use: No   Sexual activity: Not Currently  Other Topics Concern   Not on file  Social History Narrative   Not on file   Social Determinants of Health   Financial Resource Strain: Not on file  Food Insecurity: Not on file  Transportation Needs: Not on file  Physical Activity: Not on file  Stress: Not on file  Social Connections: Not on file  Intimate Partner Violence: Not on file    Outpatient Medications Prior to Visit  Medication Sig Dispense Refill   acetaminophen (TYLENOL) 500 MG tablet Take one tablet by mouth two times daily, as needed, for arthritis pain 30 tablet 0   atorvastatin (LIPITOR) 10 MG tablet Take 1 tablet (10 mg total) by mouth daily. 90 tablet 1   carvedilol (COREG) 25 MG tablet Take 1 tablet (25 mg total) by mouth 2 (two) times daily with a meal. 180 tablet 1   ergocalciferol (VITAMIN D2) 1.25 MG (50000 UT) capsule Take 1 capsule (50,000 Units  total) by mouth once a week. One capsule once weekly 12 capsule 2   fluticasone (FLONASE) 50 MCG/ACT nasal spray Place 2 sprays into both nostrils daily. 16 g 6   furosemide (LASIX) 20 MG tablet Take 1 tablet (20 mg total) by mouth daily. 90 tablet 1   losartan (COZAAR) 100 MG tablet Take 1 tablet (100 mg total) by mouth daily. 90 tablet 0   No facility-administered medications prior to visit.    No Known Allergies  Review of Systems  Constitutional: Negative.   Respiratory: Negative.    Cardiovascular: Negative.   Musculoskeletal:  Positive for neck pain.      Objective:    Physical Exam Vitals reviewed: deferred d/t nature of telephone visit.    There were no vitals taken for this visit. Wt Readings from Last 3 Encounters:  07/05/21 234 lb (106.1 kg)  05/22/21 236 lb (107 kg)  04/15/21 237 lb (107.5 kg)    Health Maintenance Due  Topic Date Due   Zoster Vaccines-  Shingrix (1 of 2) Never done   COVID-19 Vaccine (3 - Pfizer risk series) 07/04/2020    There are no preventive care reminders to display for this patient.   Lab Results  Component Value Date   TSH 1.68 08/17/2020   Lab Results  Component Value Date   WBC 4.7 04/15/2021   HGB 12.5 04/15/2021   HCT 38.0 04/15/2021   MCV 91 04/15/2021   PLT 365 04/15/2021   Lab Results  Component Value Date   NA 145 (H) 04/15/2021   K 4.2 04/15/2021   CO2 26 04/15/2021   GLUCOSE 84 04/15/2021   BUN 12 04/15/2021   CREATININE 0.96 04/15/2021   BILITOT 0.3 04/15/2021   ALKPHOS 104 04/15/2021   AST 28 04/15/2021   ALT 31 04/15/2021   PROT 7.9 04/15/2021   ALBUMIN 3.8 04/15/2021   CALCIUM 8.9 04/15/2021   ANIONGAP 6 09/15/2018   EGFR 69 04/15/2021   Lab Results  Component Value Date   CHOL 164 04/15/2021   Lab Results  Component Value Date   HDL 42 04/15/2021   Lab Results  Component Value Date   LDLCALC 105 (H) 04/15/2021   Lab Results  Component Value Date   TRIG 92 04/15/2021   Lab Results  Component Value Date   CHOLHDL 3.9 04/15/2021   Lab Results  Component Value Date   HGBA1C 5.3 08/02/2018       Assessment & Plan:   Problem List Items Addressed This Visit       Musculoskeletal and Integument   Neck strain    -Rx. Ibuprofen and tizanidine -if no improvement in 1 week, she will return to clinic; would consider imaging vs ortho or ENT consult        Meds ordered this encounter  Medications   tiZANidine (ZANAFLEX) 4 MG tablet    Sig: Take 1 tablet (4 mg total) by mouth every 6 (six) hours as needed for muscle spasms.    Dispense:  30 tablet    Refill:  0   ibuprofen (ADVIL) 600 MG tablet    Sig: Take 1 tablet (600 mg total) by mouth every 8 (eight) hours as needed for headache, mild pain or moderate pain.    Dispense:  30 tablet    Refill:  0   Date:  07/29/2021   Location of Patient: Home Location of Provider: Office Consent was obtain for  visit to be over via telehealth. I verified that I  am speaking with the correct person using two identifiers.  I connected with  Michelle David on 07/29/21 via telephone and verified that I am speaking with the correct person using two identifiers.   I discussed the limitations of evaluation and management by telemedicine. The patient expressed understanding and agreed to proceed.  Time spent: 8 min     Noreene Larsson, NP

## 2021-07-29 NOTE — Assessment & Plan Note (Addendum)
-  Rx. Ibuprofen and tizanidine -if no improvement in 1 week, she will return to clinic; would consider imaging vs ortho or ENT consult

## 2021-08-08 ENCOUNTER — Encounter: Payer: Medicare Other | Admitting: Family Medicine

## 2021-09-17 ENCOUNTER — Other Ambulatory Visit: Payer: Self-pay | Admitting: Family Medicine

## 2021-09-18 ENCOUNTER — Telehealth: Payer: Self-pay | Admitting: Family Medicine

## 2021-09-18 NOTE — Telephone Encounter (Signed)
Pt called in for refill on   furosemide (LASIX) 20 MG tablet   atorvastatin (LIPITOR) 10 MG tablet  [facility] [place]

## 2021-09-19 ENCOUNTER — Other Ambulatory Visit: Payer: Self-pay

## 2021-09-19 MED ORDER — FUROSEMIDE 20 MG PO TABS: 20.0000 mg | ORAL_TABLET | Freq: Every day | ORAL | 1 refills | Status: DC

## 2021-09-19 MED ORDER — ATORVASTATIN CALCIUM 10 MG PO TABS: 10.0000 mg | ORAL_TABLET | Freq: Every day | ORAL | 1 refills | Status: DC

## 2021-09-19 NOTE — Telephone Encounter (Signed)
Refills sent to [facility]

## 2021-10-11 ENCOUNTER — Encounter: Payer: Self-pay | Admitting: Family Medicine

## 2021-10-11 ENCOUNTER — Ambulatory Visit (INDEPENDENT_AMBULATORY_CARE_PROVIDER_SITE_OTHER): Payer: Medicare Other | Admitting: Family Medicine

## 2021-10-11 ENCOUNTER — Other Ambulatory Visit: Payer: Self-pay

## 2021-10-11 VITALS — BP 190/101 | HR 76 | Resp 16 | Ht 62.0 in | Wt 238.0 lb

## 2021-10-11 DIAGNOSIS — Z23 Encounter for immunization: Secondary | ICD-10-CM | POA: Diagnosis not present

## 2021-10-11 DIAGNOSIS — I5042 Chronic combined systolic (congestive) and diastolic (congestive) heart failure: Secondary | ICD-10-CM

## 2021-10-11 DIAGNOSIS — Z0001 Encounter for general adult medical examination with abnormal findings: Secondary | ICD-10-CM

## 2021-10-11 DIAGNOSIS — R0609 Other forms of dyspnea: Secondary | ICD-10-CM

## 2021-10-11 DIAGNOSIS — R0683 Snoring: Secondary | ICD-10-CM | POA: Diagnosis not present

## 2021-10-11 DIAGNOSIS — E785 Hyperlipidemia, unspecified: Secondary | ICD-10-CM | POA: Diagnosis not present

## 2021-10-11 DIAGNOSIS — I10 Essential (primary) hypertension: Secondary | ICD-10-CM

## 2021-10-11 DIAGNOSIS — R0789 Other chest pain: Secondary | ICD-10-CM

## 2021-10-11 DIAGNOSIS — E041 Nontoxic single thyroid nodule: Secondary | ICD-10-CM | POA: Diagnosis not present

## 2021-10-11 NOTE — Patient Instructions (Addendum)
F/U in 3 months, call if you need me sooner  Pneumonia vaccine 20 , today  Labs ordered in September today  Vital you take medications daily as prescribed to protect your heart  You are referred to pulmonary for evaluation for sleep apnea, and also to cardiology due to ongoing shortness of breath and chest discomfort

## 2021-10-11 NOTE — Progress Notes (Signed)
Michelle David     MRN: [id number]      DOB: May 13, 1961  HPI: Patient is in for annual physical exam. C/o ongoing shortness of breath with minimal activity C/o snoring has not been evaluated foir sleep apnea Recent labs,  are reviewed. Immunization is reviewed , and  updated if needed.   PE: BP (!) 190/101    Pulse 76    Resp 16    Ht 5\' 2"  (1.575 m)    Wt 238 lb (108 kg)    SpO2 96%    BMI 43.53 kg/m   Pleasant  female, alert and oriented x 3, in no cardio-pulmonary distress. Afebrile. HEENT No facial trauma or asymetry. Sinuses non tender.  Extra occullar muscles intact.. External ears normal, . Neck: supple, no adenopathy,JVD or thyromegaly.No bruits.  Chest: Clear to ascultation bilaterally.No crackles or wheezes. Non tender to palpation    Cardiovascular system; Heart sounds normal,  S1 and  S2 ,no S3.  Systolic  murmur, no thrill. Apical beat not displaced Peripheral pulses normal.  Abdomen: Soft, non tender, no organomegaly or masses. No bruits. Bowel sounds normal. No guarding, tenderness or rebound.     Musculoskeletal exam: Decreased  ROM of spine, hips , shoulders and knees.  deformity ,swelling and  crepitus noted. No muscle wasting or atrophy.   Neurologic: Cranial nerves 2 to 12 intact. Power, tone ,sensation and reflexes normal throughout. No disturbance in gait. No tremor.  Skin: Intact, no ulceration, erythema , scaling or rash noted. Pigmentation normal throughout  Psych; Normal mood and affect. Judgement and concentration normal   Assessment & Plan:  Encounter for Medicare annual examination with abnormal findings Annual exam as documented. Counseling done  re healthy lifestyle involving commitment to 150 minutes exercise per week, heart healthy diet, and attaining healthy weight.The importance of adequate sleep also discussed. Regular seat belt use and home safety, is also discussed. Changes in health habits are decided on by  the patient with goals and time frames  set for achieving them. Immunization and cancer screening needs are specifically addressed at this visit.   Morbid obesity  Patient re-educated about  the importance of commitment to a  minimum of 150 minutes of exercise per week as able.  The importance of healthy food choices with portion control discussed, as well as eating regularly and within a 12 hour window most days. The need to choose "clean , green" food 50 to 75% of the time is discussed, as well as to make water the primary drink and set a goal of 64 ounces water daily.    Weight /BMI 10/11/2021 07/05/2021 05/22/2021  WEIGHT 238 lb 234 lb 236 lb  HEIGHT 5\' 2"  5\' 2"  5\' 2"   BMI 43.53 kg/m2 42.8 kg/m2 43.16 kg/m2      Chronic combined systolic and diastolic CHF (congestive heart failure) ([place]) Reports increased exertional dyspnea and intermittent chest discomfort, refer to cardiology for follow up and re eval  HTN (hypertension), malignant Uncontrolled , reportsnon compliance , re educated at visit re need to comply  DASH diet and commitment to daily physical activity for a minimum of 30 minutes discussed and encouraged, as a part of hypertension management. The importance of attaining a healthy weight is also discussed.  BP/Weight 10/11/2021 07/05/2021 05/22/2021 04/15/2021 08/15/2020 06/04/2020 7/82/4235  Systolic BP 361 443 154 008 676 - 195  Diastolic BP 093 92 70 89 86 - 92  Wt. (Lbs) 238 234 236 237 240 239 239  BMI 43.53 42.8 43.16 41.98 42.51 43.71 43.71

## 2021-10-12 LAB — LIPID PANEL
Chol/HDL Ratio: 3.1 ratio (ref 0.0–4.4)
Cholesterol, Total: 178 mg/dL (ref 100–199)
HDL: 57 mg/dL (ref >39)
LDL Chol Calc (NIH): 109 mg/dL — ABNORMAL HIGH (ref 0–99)
Triglycerides: 63 mg/dL (ref 0–149)
VLDL Cholesterol Cal: 12 mg/dL (ref 5–40)

## 2021-10-12 LAB — CMP14+EGFR
ALT: 12 IU/L (ref 0–32)
AST: 15 IU/L (ref 0–40)
Albumin/Globulin Ratio: 0.9 — ABNORMAL LOW (ref 1.2–2.2)
Albumin: 4 g/dL (ref 3.8–4.9)
Alkaline Phosphatase: 94 IU/L (ref 44–121)
BUN/Creatinine Ratio: 12 (ref 9–23)
BUN: 10 mg/dL (ref 6–24)
Bilirubin Total: 0.3 mg/dL (ref 0.0–1.2)
CO2: 28 mmol/L (ref 20–29)
Calcium: 9.2 mg/dL (ref 8.7–10.2)
Chloride: 102 mmol/L (ref 96–106)
Creatinine, Ser: 0.85 mg/dL (ref 0.57–1.00)
Globulin, Total: 4.4 g/dL (ref 1.5–4.5)
Glucose: 74 mg/dL (ref 70–99)
Potassium: 4.2 mmol/L (ref 3.5–5.2)
Sodium: 143 mmol/L (ref 134–144)
Total Protein: 8.4 g/dL (ref 6.0–8.5)
eGFR: 79 mL/min/{1.73_m2} (ref >59)

## 2021-10-12 LAB — TSH: TSH: 1.84 u[IU]/mL (ref 0.450–4.500)

## 2021-10-15 DIAGNOSIS — Z0001 Encounter for general adult medical examination with abnormal findings: Secondary | ICD-10-CM | POA: Insufficient documentation

## 2021-10-15 NOTE — Assessment & Plan Note (Signed)

## 2021-10-15 NOTE — Assessment & Plan Note (Signed)
Uncontrolled , reportsnon compliance , re educated at visit re need to comply  DASH diet and commitment to daily physical activity for a minimum of 30 minutes discussed and encouraged, as a part of hypertension management. The importance of attaining a healthy weight is also discussed.  BP/Weight 10/11/2021 07/05/2021 05/22/2021 04/15/2021 08/15/2020 06/04/2020 8/81/1031  Systolic BP 594 585 929 244 628 - 638  Diastolic BP 177 92 70 89 86 - 92  Wt. (Lbs) 238 234 236 237 240 239 239  BMI 43.53 42.8 43.16 41.98 42.51 43.71 43.71

## 2021-10-15 NOTE — Assessment & Plan Note (Signed)
Reports increased exertional dyspnea and intermittent chest discomfort, refer to cardiology for follow up and re eval

## 2021-10-15 NOTE — Assessment & Plan Note (Signed)
°  Patient re-educated about  the importance of commitment to a  minimum of 150 minutes of exercise per week as able.  The importance of healthy food choices with portion control discussed, as well as eating regularly and within a 12 hour window most days. The need to choose "clean , green" food 50 to 75% of the time is discussed, as well as to make water the primary drink and set a goal of 64 ounces water daily.    Weight /BMI 10/11/2021 07/05/2021 05/22/2021  WEIGHT 238 lb 234 lb 236 lb  HEIGHT 5\' 2"  5\' 2"  5\' 2"   BMI 43.53 kg/m2 42.8 kg/m2 43.16 kg/m2

## 2021-10-24 ENCOUNTER — Telehealth: Payer: Self-pay | Admitting: Family Medicine

## 2021-10-24 NOTE — Telephone Encounter (Signed)
Tried to call pt on both mobile and home phone numbers, it keeps telling me I can  not complete this call at this time on both lines

## 2021-10-24 NOTE — Telephone Encounter (Signed)
Pt called in for blood work results

## 2021-10-29 ENCOUNTER — Encounter: Payer: Medicare Other | Admitting: Family Medicine

## 2021-11-25 ENCOUNTER — Encounter: Payer: Self-pay | Admitting: Cardiology

## 2021-11-25 ENCOUNTER — Other Ambulatory Visit: Payer: Self-pay

## 2021-11-25 ENCOUNTER — Ambulatory Visit: Payer: Medicare Other | Admitting: Cardiology

## 2021-11-25 VITALS — BP 154/98 | HR 88 | Ht 62.0 in | Wt 237.4 lb

## 2021-11-25 DIAGNOSIS — I10 Essential (primary) hypertension: Secondary | ICD-10-CM

## 2021-11-25 DIAGNOSIS — I428 Other cardiomyopathies: Secondary | ICD-10-CM

## 2021-11-25 MED ORDER — SPIRONOLACTONE 25 MG PO TABS
12.5000 mg | ORAL_TABLET | Freq: Every day | ORAL | 3 refills | Status: DC
  Filled 2021-11-25: qty 45, 90d supply, fill #0
  Filled ????-??-??: fill #1

## 2021-11-25 NOTE — Progress Notes (Signed)
Cardiology Office Note  Date: 11/25/2021   ID: [name], [name] 1961/06/21, MRN [id number]  PCP:  Fayrene Helper, MD  Cardiologist:  Rozann Lesches, MD Electrophysiologist:  None   Chief Complaint  Patient presents with   Cardiac follow-up    History of Present Illness: Michelle David is a 61 y.o. female last seen in August 2022 by Ms. Strader PA-C.  She is here for a follow-up visit.  Reports no significant change in weight, stable NYHA class II dyspnea.  No palpitations or syncope.  I reviewed her medications which are noted below.  She had not taken them yet this morning.  Lasix remains low-dose at 20 mg daily.  Her last echocardiogram was in January 2022 at which point LVEF was 50 to 55% range with mild LVH and normal RV contraction, mild mitral regurgitation at that time.  I reviewed her interval lab work from PCP as noted below.  Past Medical History:  Diagnosis Date   Cardiomyopathy [facility])    Diagnosed March 2018   CHF (congestive heart failure) ([place])    a. EF 30-35% by echo in 12/2016 b. EF improved to 45-50% in 07/2018 c. 10/2020: EF improved to 50-55%.   Dyspepsia    Essential hypertension    Hyperlipidemia    Hypoxia 11/01/2018   Impaired glucose tolerance    Multifocal pneumonia 11/01/2018   Obesity    Snoring 01/22/2018   Solitary thyroid nodule 03/01/2015   Dominant left thyroid nodule 3.2 cm noted in 02/2015     Past Surgical History:  Procedure Laterality Date   COLONOSCOPY N/A 03/19/2018   Procedure: COLONOSCOPY;  Surgeon: Danie Binder, MD;  [facility];  Service: Endoscopy;  Laterality: N/A;  2:00   OOPHORECTOMY     TUBAL LIGATION      Current Outpatient Medications  Medication Sig Dispense Refill   acetaminophen (TYLENOL) 500 MG tablet Take one tablet by mouth two times daily, as needed, for arthritis pain 30 tablet 0   atorvastatin (LIPITOR) 10 MG tablet Take 1 tablet (10 mg total) by mouth daily. 90 tablet 1    carvedilol (COREG) 25 MG tablet Take 1 tablet (25 mg total) by mouth 2 (two) times daily with a meal. 180 tablet 1   ergocalciferol (VITAMIN D2) 1.25 MG (50000 UT) capsule Take 1 capsule (50,000 Units total) by mouth once a week. One capsule once weekly 12 capsule 2   fluticasone (FLONASE) 50 MCG/ACT nasal spray Place 2 sprays into both nostrils daily. 16 g 6   furosemide (LASIX) 20 MG tablet Take 1 tablet (20 mg total) by mouth daily. 90 tablet 1   ibuprofen (ADVIL) 600 MG tablet Take 1 tablet (600 mg total) by mouth every 8 (eight) hours as needed for headache, mild pain or moderate pain. 30 tablet 0   losartan (COZAAR) 100 MG tablet Take 1 tablet by mouth once daily 90 tablet 0   spironolactone (ALDACTONE) 25 MG tablet Take 0.5 tablets (12.5 mg total) by mouth daily. 45 tablet 3   tiZANidine (ZANAFLEX) 4 MG tablet Take 1 tablet (4 mg total) by mouth every 6 (six) hours as needed for muscle spasms. 30 tablet 0   No current facility-administered medications for this visit.   Allergies:  Patient has no known allergies.   ROS: No orthopnea or PND.  Physical Exam: VS:  BP (!) 154/98    Pulse 88    Ht 5\' 2"  (1.575 m)  Wt 237 lb 6.4 oz (107.7 kg)    SpO2 98%    BMI 43.42 kg/m , BMI Body mass index is 43.42 kg/m.  Wt Readings from Last 3 Encounters:  11/25/21 237 lb 6.4 oz (107.7 kg)  10/11/21 238 lb (108 kg)  07/05/21 234 lb (106.1 kg)    General: Patient appears comfortable at rest. HEENT: Conjunctiva and lids normal, wearing a mask. Neck: Supple, no elevated JVP or carotid bruits, no thyromegaly. Lungs: Clear to auscultation, nonlabored breathing at rest. Cardiac: Regular rate and rhythm, no S3, 2/6 systolic murmur. Extremities: Trace ankle edema, distal pulses 2+.  ECG:  An ECG dated 05/22/2021 was personally reviewed today and demonstrated:  Sinus rhythm with diffuse repolarization abnormalities.  Recent Labwork: 04/15/2021: Hemoglobin 12.5; Platelets 365 10/11/2021: ALT 12; AST  15; BUN 10; Creatinine, Ser 0.85; Potassium 4.2; Sodium 143; TSH 1.840     Component Value Date/Time   CHOL 178 10/11/2021 1126   TRIG 63 10/11/2021 1126   HDL 57 10/11/2021 1126   CHOLHDL 3.1 10/11/2021 1126   CHOLHDL 4.1 08/17/2020 1332   VLDL 26 01/15/2017 1816   LDLCALC 109 (H) 10/11/2021 1126   LDLCALC 122 (H) 08/17/2020 1332    Other Studies Reviewed Today:  Echocardiogram 10/30/2020:  1. Left ventricular ejection fraction, by estimation, is 50 to 55%. The  left ventricle has low normal function. The left ventricle has no regional  wall motion abnormalities. There is mild left ventricular hypertrophy.  Left ventricular diastolic  parameters are indeterminate.   2. Right ventricular systolic function is normal. The right ventricular  size is normal. Tricuspid regurgitation signal is inadequate for assessing  PA pressure.   3. The mitral valve is grossly normal. Mild mitral valve regurgitation.   4. The aortic valve is tricuspid. Aortic valve regurgitation is trivial.   5. The inferior vena cava is normal in size with greater than 50%  respiratory variability, suggesting right atrial pressure of 3 mmHg.   Assessment and Plan:  1.HfrecEF with history of nonischemic cardiomyopathy, LVEF 50 to 55% by echocardiogram in January of last year.  Plan to obtain a repeat study.  Continue Coreg, losartan, and Lasix.  We will add Aldactone 12.5 mg daily.  Check BMET in 7 to 10 days.  2.  Essential hypertension, blood pressure elevated today.  We are adding Aldactone as noted above.  3.  Mixed hyperlipidemia, continues on Lipitor with follow-up by Dr. Moshe Cipro.  Last LDL was 109.  Medication Adjustments/Labs and Tests Ordered: Current medicines are reviewed at length with the patient today.  Concerns regarding medicines are outlined above.   Tests Ordered: Orders Placed This Encounter  Procedures   Basic metabolic panel   ECHOCARDIOGRAM COMPLETE    Medication Changes: Meds  ordered this encounter  Medications   spironolactone (ALDACTONE) 25 MG tablet    Sig: Take 0.5 tablets (12.5 mg total) by mouth daily.    Dispense:  45 tablet    Refill:  3    Disposition:  Follow up  6 months .  Signed, Satira Sark, MD, [facility] 11/25/2021 10:34 AM    [facility] [place]. [address], [place], [place] [address] Phone: [phone]; Fax: [phone]

## 2021-11-25 NOTE — Patient Instructions (Signed)
Medication Instructions:  START Aldactone 12.5 mg daily  Labwork: BMET in 7-10 days (2/13-2/15)  Testing/Procedures: Your physician has requested that you have an echocardiogram. Echocardiography is a painless test that uses sound waves to create images of your heart. It provides your doctor with information about the size and shape of your heart and how well your hearts chambers and valves are working. This procedure takes approximately one hour. There are no restrictions for this procedure.   Follow-Up: 6 months  Any Other Special Instructions Will Be Listed Below (If Applicable).  If you need a refill on your cardiac medications before your next appointment, please call your pharmacy.

## 2021-11-26 ENCOUNTER — Other Ambulatory Visit: Payer: Self-pay

## 2021-11-27 ENCOUNTER — Other Ambulatory Visit: Payer: Self-pay

## 2021-11-27 ENCOUNTER — Telehealth: Payer: Self-pay

## 2021-11-27 MED ORDER — CARVEDILOL 25 MG PO TABS
25.0000 mg | ORAL_TABLET | Freq: Two times a day (BID) | ORAL | 1 refills | Status: DC
  Filled 2021-11-27 – 2021-12-04 (×3): qty 180, 90d supply, fill #0
  Filled 2022-03-10 – 2022-03-24 (×2): qty 180, 90d supply, fill #1

## 2021-11-27 MED ORDER — FUROSEMIDE 20 MG PO TABS
20.0000 mg | ORAL_TABLET | Freq: Every day | ORAL | 1 refills | Status: DC
  Filled 2021-11-27 – 2021-12-04 (×2): qty 90, 90d supply, fill #0
  Filled 2022-03-10 – 2022-03-24 (×2): qty 90, 90d supply, fill #1

## 2021-11-27 MED ORDER — ATORVASTATIN CALCIUM 10 MG PO TABS
10.0000 mg | ORAL_TABLET | Freq: Every day | ORAL | 1 refills | Status: DC
  Filled 2021-11-27 – 2021-12-04 (×2): qty 90, 90d supply, fill #0
  Filled 2022-02-10: qty 90, 90d supply, fill #1

## 2021-11-27 MED ORDER — LOSARTAN POTASSIUM 100 MG PO TABS
100.0000 mg | ORAL_TABLET | Freq: Every day | ORAL | 0 refills | Status: DC
  Filled 2021-11-27 – 2021-12-04 (×2): qty 90, 90d supply, fill #0

## 2021-11-27 NOTE — Telephone Encounter (Signed)
LVM making patient aware.

## 2021-11-27 NOTE — Telephone Encounter (Signed)
To let Dr Moshe Cipro know that Dr Domenic Polite put her on Aldactone 25 mg on Monday, 02.06.2023. Should patient continue the fluid pill that Dr Moshe Cipro gave her? Please call patient back at [phone]. Patient waiting on call before taking any more medication.   Patient called need med refill  losartan (COZAAR) 100 MG tablet   carvedilol (COREG) 25 MG tablet   furosemide (LASIX) 20 MG tablet  atorvastatin (LIPITOR) 10 MG tablet   [facility] at [place]. [address], [place], [place] [address]  Phone:  [phone]  Fax:  [phone]

## 2021-12-03 ENCOUNTER — Other Ambulatory Visit: Payer: Self-pay

## 2021-12-04 ENCOUNTER — Other Ambulatory Visit: Payer: Self-pay

## 2021-12-18 ENCOUNTER — Ambulatory Visit (HOSPITAL_COMMUNITY)
Admission: RE | Admit: 2021-12-18 | Discharge: 2021-12-18 | Disposition: A | Discharge status: Home / Self Care | Payer: Medicare Other | Source: Ambulatory Visit | Attending: Cardiology | Admitting: Cardiology

## 2021-12-18 DIAGNOSIS — I428 Other cardiomyopathies: Secondary | ICD-10-CM | POA: Diagnosis not present

## 2021-12-18 LAB — ECHOCARDIOGRAM COMPLETE
AR max vel: 1.85 cm2
AV Area VTI: 2.25 cm2
AV Area mean vel: 1.9 cm2
AV Mean grad: 4 mmHg
AV Peak grad: 9 mmHg
Ao pk vel: 1.5 m/s
Area-P 1/2: 2.99 cm2
Calc EF: 50.5 %
MV VTI: 2.06 cm2
S' Lateral: 3.2 cm
Single Plane A2C EF: 52.3 %
Single Plane A4C EF: 49.3 %

## 2021-12-18 NOTE — Progress Notes (Incomplete)
*  PRELIMINARY RESULTS* ?Echocardiogram ?2D Echocardiogram has been performed. ? ?Michelle David ?12/18/2021, 2:02 PM ?

## 2022-01-09 ENCOUNTER — Ambulatory Visit: Payer: Medicare Other | Admitting: Family Medicine

## 2022-01-23 ENCOUNTER — Ambulatory Visit: Payer: Medicare Other | Admitting: Family Medicine

## 2022-01-27 ENCOUNTER — Other Ambulatory Visit: Payer: Self-pay

## 2022-02-05 ENCOUNTER — Encounter: Payer: Self-pay | Admitting: Family Medicine

## 2022-02-05 ENCOUNTER — Ambulatory Visit (INDEPENDENT_AMBULATORY_CARE_PROVIDER_SITE_OTHER): Payer: Medicare Other | Admitting: Family Medicine

## 2022-02-05 ENCOUNTER — Other Ambulatory Visit: Payer: Self-pay

## 2022-02-05 VITALS — BP 150/69 | HR 70 | Resp 16 | Ht 62.0 in | Wt 235.1 lb

## 2022-02-05 DIAGNOSIS — R9431 Abnormal electrocardiogram [ECG] [EKG]: Secondary | ICD-10-CM

## 2022-02-05 DIAGNOSIS — Z0000 Encounter for general adult medical examination without abnormal findings: Secondary | ICD-10-CM | POA: Insufficient documentation

## 2022-02-05 DIAGNOSIS — Z0001 Encounter for general adult medical examination with abnormal findings: Secondary | ICD-10-CM

## 2022-02-05 DIAGNOSIS — Z1231 Encounter for screening mammogram for malignant neoplasm of breast: Secondary | ICD-10-CM | POA: Diagnosis not present

## 2022-02-05 DIAGNOSIS — I10 Essential (primary) hypertension: Secondary | ICD-10-CM | POA: Diagnosis not present

## 2022-02-05 HISTORY — DX: Abnormal electrocardiogram (ECG) (EKG): R94.31

## 2022-02-05 MED ORDER — ERGOCALCIFEROL 1.25 MG (50000 UT) PO CAPS
50000.0000 [IU] | ORAL_CAPSULE | ORAL | 1 refills | Status: DC
  Filled 2022-02-05: qty 12, 84d supply, fill #0

## 2022-02-05 MED ORDER — SPIRONOLACTONE 25 MG PO TABS
ORAL_TABLET | ORAL | 3 refills | Status: DC
  Filled 2022-02-05: qty 90, 90d supply, fill #0
  Filled 2022-05-07: qty 90, 90d supply, fill #1

## 2022-02-05 NOTE — Progress Notes (Signed)
Preventive Screening-Counseling & Management  ? ?Patient present here today for a welcome to medicare visit ? ? ?Current Problems (verified)  ? ?Medications Prior to Visit Allergies (verified)  ? ?PAST HISTORY  ?Family History- married for 20 years and 3 children, 2 boys and a girl. , daughter age 59 has HTN ? ?Social History- Worked with autistic adults, [employer], now disabled  x 3 years ?Never smoked but 2nd hand exposure from spouse and daughter, no alcohol rarely socially ? ? ?Risk Factors  ?Cardiomyopathy, HTN and valve disase ? ?Current exercise habits:  doesn't exercise much because she tires very easily when she exerts herself. Plans to start at 3 days/ week for 30 mins per session ? ?Dietary issues discussed: tries to eat a lot of fruits and vegetables as well as a lot of fried fatty foods. Advised to limit fried and eat more baked boiled foods ? ? ?Cardiac risk factors: HTN, heart murmur, cardiomyopathy ? ?Depression Screen  ?(Note: if answer to either of the following is "Yes", a more complete depression screening is indicated)  ? ?Over the past two weeks, have you felt down, depressed or hopeless? Just a little bit of sadness due to recent death in family  ?Over the past two weeks, have you felt little interest or pleasure in doing things? A little bit ?Have you lost interest or pleasure in daily life? No  ?Do you often feel hopeless? Sometimes- feels like she is not needed  ?Do you cry easily over simple problems? sometimes ? ?Activities of Daily Living  ?In your present state of health, do you have any difficulty performing the following activities? ? ?Driving?: No ?Managing money?: No ?Feeding yourself?:No ?Getting from bed to chair?:No ?Climbing a flight of stairs?: cannot do without giving out of breath ?Preparing food and eating?:No ?Bathing or showering?:No ?Getting dressed?:No ?Getting to the toilet?:No ?Using the toilet?:No ?Moving around from place to place?: no ? ?Fall Risk  Assessment ?In the past year have you fallen or had a near fall?:No ?Are you currently taking any medications that make you dizzy?: if she doesn't take her meds consistently she will sometimes feel dizzy  ? ? ?Hearing Difficulties: No ?Do you often ask people to speak up or repeat themselves?:No ?Do you experience ringing or noises in your ears?:No ?Do you have difficulty understanding soft or whispered voices?:No ? ?Cognitive Testing  ?Alert? Yes Normal Appearance?Yes  ?Oriented to person? Yes Place? Yes  ?Time? Yes  ?Displays appropriate judgment?Yes  ?Can read the correct time from a watch face? yes ?Are you having problems remembering things? Sometimes things don't come to her right away but she eventually remembers after a few mins  ? ?Advanced Directives have been discussed with the patient?Yes  ? ? ?List the Names of Other Physician/Practitioners you currently use: Dr Duard Brady, cardiology ?Dr Merlene Laughter, neurology ? ? ?Indicate any recent Medical Services you may have received from other than [facility] providers in the past year (date may be approximate).  ? ? ? ?Medicare Attestation  ?I have personally reviewed:  ?The patient's medical and social history  ?Their use of alcohol, tobacco or illicit drugs  ?Their current medications and supplements  ?The patient's functional ability including ADLs,fall risks, home safety risks, cognitive, and hearing and visual impairment  ?Diet and physical activities  ?Evidence for depression or mood disorders  ?The patient's weight, height, BMI, and visual acuity have been recorded in the chart. I have made referrals, counseling, and provided education to the patient  based on review of the above and I have provided the patient with a written personalized care plan for preventive services.  ? ? ?Physical Exam ?BP (!) 150/69   Pulse 70   Resp 16   Ht '5\' 2"'$  (1.575 m)   Wt 235 lb 1.9 oz (106.6 kg)   SpO2 93%   BMI 43.00 kg/m?  ? ? ?Assessment & Plan:  ?Welcome to Medicare  preventive visit ?Welcome to [employer]  as documented. ?Counseling done  re healthy lifestyle involving commitment to 150 minutes exercise per week, heart healthy diet, and attaining healthy weight.The importance of adequate sleep also discussed. ?Regular seat belt use and home safety, is also discussed. ?EKG changed from prior with possible inferior infarct will request Cardiology review ? ?Abnormal EKG ?When compared to prior,possible inferior infarct which is new, Cardiology to evaluate, will refer, has upcoming appt ? ?HTN (hypertension), malignant ?Uncontrolled, inc spironolactone to one daily ?DASH diet and commitment to daily physical activity for a minimum of 30 minutes discussed and encouraged, as a part of hypertension management. ?The importance of attaining a healthy weight is also discussed. ? ? ?  02/05/2022  ?  2:14 PM 11/25/2021  ? 10:10 AM 10/11/2021  ? 10:23 AM 07/05/2021  ? 10:56 AM 07/05/2021  ?  9:18 AM 05/22/2021  ?  1:06 PM 04/15/2021  ?  1:07 PM  ?BP/Weight  ?Systolic BP 588 502 774 128 153 140 157  ?Diastolic BP 69 98 786 92 79 70 89  ?Wt. (Lbs) 235.12 237.4 238  234 236 237  ?BMI 43 kg/m2 43.42 kg/m2 43.53 kg/m2  42.8 kg/m2 43.16 kg/m2 41.98 kg/m2  ? ? ? ? ? ?

## 2022-02-05 NOTE — Assessment & Plan Note (Signed)
When compared to prior,possible inferior infarct which is new, Cardiology to evaluate, will refer, has upcoming appt ?

## 2022-02-05 NOTE — Patient Instructions (Addendum)
F/IU in may as before, call if you need me sooner ? ?Please get shingrix vaccines ? ?Increase spironolactone to ONE tablet once daily ? ?Please commit to once weekly vit D ? ? ? ?It is important that you exercise regularly at least 30 minutes 5 times a week. If you develop chest pain, have severe difficulty breathing, or feel very tired, stop exercising immediately and seek medical attention  ? ?Please increase vegetables, water and fruit  ? ?Please review information on living will ? ?Please schedule August mammogram at checkout ? ?Thanks for choosing [facility], we consider it a privelige to serve you. ? ?

## 2022-02-05 NOTE — Assessment & Plan Note (Signed)
Welcome to [employer]  as documented. ?Counseling done  re healthy lifestyle involving commitment to 150 minutes exercise per week, heart healthy diet, and attaining healthy weight.The importance of adequate sleep also discussed. ?Regular seat belt use and home safety, is also discussed. ?EKG changed from prior with possible inferior infarct will request Cardiology review ?

## 2022-02-05 NOTE — Assessment & Plan Note (Signed)
Uncontrolled, inc spironolactone to one daily ?DASH diet and commitment to daily physical activity for a minimum of 30 minutes discussed and encouraged, as a part of hypertension management. ?The importance of attaining a healthy weight is also discussed. ? ? ?  02/05/2022  ?  2:14 PM 11/25/2021  ? 10:10 AM 10/11/2021  ? 10:23 AM 07/05/2021  ? 10:56 AM 07/05/2021  ?  9:18 AM 05/22/2021  ?  1:06 PM 04/15/2021  ?  1:07 PM  ?BP/Weight  ?Systolic BP 364 383 779 396 153 140 157  ?Diastolic BP 69 98 886 92 79 70 89  ?Wt. (Lbs) 235.12 237.4 238  234 236 237  ?BMI 43 kg/m2 43.42 kg/m2 43.53 kg/m2  42.8 kg/m2 43.16 kg/m2 41.98 kg/m2  ? ? ? ? ?

## 2022-02-10 ENCOUNTER — Other Ambulatory Visit: Payer: Self-pay

## 2022-02-11 ENCOUNTER — Telehealth: Payer: Self-pay

## 2022-02-11 NOTE — Telephone Encounter (Signed)
Patient called asking the new medicine should she take it with all her other medications. Please return patient call [phone]. ?

## 2022-02-11 NOTE — Telephone Encounter (Signed)
Patient aware she can take the vitamin d with her other medications ?

## 2022-02-21 ENCOUNTER — Ambulatory Visit: Payer: Medicare Other | Admitting: Family Medicine

## 2022-03-06 ENCOUNTER — Ambulatory Visit: Payer: Medicare Other | Admitting: Family Medicine

## 2022-03-10 ENCOUNTER — Other Ambulatory Visit: Payer: Self-pay

## 2022-03-10 ENCOUNTER — Other Ambulatory Visit: Payer: Self-pay | Admitting: Family Medicine

## 2022-03-10 MED ORDER — LOSARTAN POTASSIUM 100 MG PO TABS
100.0000 mg | ORAL_TABLET | Freq: Every day | ORAL | 0 refills | Status: DC
  Filled 2022-03-10 – 2022-03-24 (×2): qty 90, 90d supply, fill #0

## 2022-03-14 ENCOUNTER — Ambulatory Visit (INDEPENDENT_AMBULATORY_CARE_PROVIDER_SITE_OTHER): Payer: Medicare Other | Admitting: Family Medicine

## 2022-03-14 ENCOUNTER — Encounter: Payer: Self-pay | Admitting: Family Medicine

## 2022-03-14 VITALS — BP 160/92 | HR 79 | Resp 16 | Ht 62.0 in | Wt 235.1 lb

## 2022-03-14 DIAGNOSIS — I5042 Chronic combined systolic (congestive) and diastolic (congestive) heart failure: Secondary | ICD-10-CM

## 2022-03-14 DIAGNOSIS — I110 Hypertensive heart disease with heart failure: Secondary | ICD-10-CM

## 2022-03-14 DIAGNOSIS — E042 Nontoxic multinodular goiter: Secondary | ICD-10-CM

## 2022-03-14 DIAGNOSIS — M79642 Pain in left hand: Secondary | ICD-10-CM

## 2022-03-14 DIAGNOSIS — E559 Vitamin D deficiency, unspecified: Secondary | ICD-10-CM

## 2022-03-14 DIAGNOSIS — E041 Nontoxic single thyroid nodule: Secondary | ICD-10-CM | POA: Diagnosis not present

## 2022-03-14 DIAGNOSIS — E010 Iodine-deficiency related diffuse (endemic) goiter: Secondary | ICD-10-CM

## 2022-03-14 DIAGNOSIS — I10 Essential (primary) hypertension: Secondary | ICD-10-CM | POA: Diagnosis not present

## 2022-03-14 DIAGNOSIS — E785 Hyperlipidemia, unspecified: Secondary | ICD-10-CM | POA: Diagnosis not present

## 2022-03-14 DIAGNOSIS — G8929 Other chronic pain: Secondary | ICD-10-CM | POA: Diagnosis not present

## 2022-03-14 NOTE — Progress Notes (Unsigned)
   Michelle David     MRN: [id number]      DOB: [birth year]   HPI Michelle David is here for follow up and re-evaluation of chronic medical conditions, medication management and review of any available recent lab and radiology data.  Preventive health is updated, specifically  Cancer screening and Immunization.   Forgot bP meds today, sick spouse demanded more of her this morning C/o fullness in her neck , concerned about thyroid nodules C/o tingling in her hands , she awakens and shakes the hands for relief  ROS Denies recent fever or chills. Denies sinus pressure, nasal congestion, ear pain or sore throat. Denies chest congestion, productive cough or wheezing. Denies chest pains, palpitations and leg swelling Denies abdominal pain, nausea, vomiting,diarrhea or constipation.   Denies dysuria, frequency, hesitancy or incontinence.  Denies depression, anxiety or insomnia. Denies skin break down or rash.   PE  BP (!) 160/92   Pulse 79   Resp 16   Ht '5\' 2"'$  (1.575 m)   Wt 235 lb 1.9 oz (106.6 kg)   SpO2 96%   BMI 43.00 kg/m   Patient alert and oriented and in no cardiopulmonary distress.  HEENT: No facial asymmetry, EOMI,     Neck supple .thyromegaly, non tender , no cervical adenopathy  Chest: Clear to auscultation bilaterally.  CVS: S1, S2 no murmurs, no S3.Regular rate.  ABD: Soft non tender.   Ext: No edema  MS: Adequate ROM spine, shoulders, hips and knees.  Skin: Intact, no ulcerations or rash noted.  Psych: Good eye contact, normal affect. Memory intact not anxious or depressed appearing.  CNS: CN 2-12 intact, power,  normal throughout.no focal deficits noted.Positive tinnel's   Assessment & Plan  ***

## 2022-03-14 NOTE — Patient Instructions (Signed)
F/u end September, call if you need me sooner  Please do remember to take medication every day as prescribed to protect your heart, and brain  Labs today, cBC, lipid, cmp and EGFR, tSH, vit D  Please get your shingrix vaccines  It is important that you exercise regularly at least 30 minutes 5 times a week. If you develop chest pain, have severe difficulty breathing, or feel very tired, stop exercising immediately and seek medical attention   Thanks for choosing [facility], we consider it a privelige to serve you.

## 2022-03-15 LAB — CMP14+EGFR
ALT: 18 IU/L (ref 0–32)
AST: 18 IU/L (ref 0–40)
Albumin/Globulin Ratio: 0.8 — ABNORMAL LOW (ref 1.2–2.2)
Albumin: 3.9 g/dL (ref 3.8–4.9)
Alkaline Phosphatase: 81 IU/L (ref 44–121)
BUN/Creatinine Ratio: 16 (ref 9–23)
BUN: 15 mg/dL (ref 6–24)
Bilirubin Total: 0.4 mg/dL (ref 0.0–1.2)
CO2: 24 mmol/L (ref 20–29)
Calcium: 9.1 mg/dL (ref 8.7–10.2)
Chloride: 103 mmol/L (ref 96–106)
Creatinine, Ser: 0.93 mg/dL (ref 0.57–1.00)
Globulin, Total: 4.6 g/dL — ABNORMAL HIGH (ref 1.5–4.5)
Glucose: 107 mg/dL — ABNORMAL HIGH (ref 70–99)
Potassium: 4.4 mmol/L (ref 3.5–5.2)
Sodium: 141 mmol/L (ref 134–144)
Total Protein: 8.5 g/dL (ref 6.0–8.5)
eGFR: 71 mL/min/{1.73_m2} (ref >59)

## 2022-03-15 LAB — CBC
Hematocrit: 38.1 % (ref 34.0–46.6)
Hemoglobin: 13.1 g/dL (ref 11.1–15.9)
MCH: 30.9 pg (ref 26.6–33.0)
MCHC: 34.4 g/dL (ref 31.5–35.7)
MCV: 90 fL (ref 79–97)
Platelets: 352 10*3/uL (ref 150–450)
RBC: 4.24 x10E6/uL (ref 3.77–5.28)
RDW: 13.5 % (ref 11.7–15.4)
WBC: 5 10*3/uL (ref 3.4–10.8)

## 2022-03-15 LAB — LIPID PANEL
Chol/HDL Ratio: 3.7 ratio (ref 0.0–4.4)
Cholesterol, Total: 173 mg/dL (ref 100–199)
HDL: 47 mg/dL (ref >39)
LDL Chol Calc (NIH): 113 mg/dL — ABNORMAL HIGH (ref 0–99)
Triglycerides: 66 mg/dL (ref 0–149)
VLDL Cholesterol Cal: 13 mg/dL (ref 5–40)

## 2022-03-15 LAB — VITAMIN D 25 HYDROXY (VIT D DEFICIENCY, FRACTURES): Vit D, 25-Hydroxy: 21.9 ng/mL — ABNORMAL LOW (ref 30.0–100.0)

## 2022-03-15 LAB — TSH: TSH: 1.65 u[IU]/mL (ref 0.450–4.500)

## 2022-03-15 MED ORDER — ATORVASTATIN CALCIUM 20 MG PO TABS
20.0000 mg | ORAL_TABLET | Freq: Every day | ORAL | 5 refills | Status: DC
  Filled 2022-03-15: qty 30, 30d supply, fill #0
  Filled 2022-04-30: qty 30, 30d supply, fill #1

## 2022-03-15 MED ORDER — ERGOCALCIFEROL 1.25 MG (50000 UT) PO CAPS
50000.0000 [IU] | ORAL_CAPSULE | ORAL | 2 refills | Status: DC
  Filled 2022-03-15 – 2022-05-07 (×3): qty 12, 84d supply, fill #0

## 2022-03-17 ENCOUNTER — Encounter: Payer: Self-pay | Admitting: Family Medicine

## 2022-03-17 DIAGNOSIS — E559 Vitamin D deficiency, unspecified: Secondary | ICD-10-CM

## 2022-03-17 HISTORY — DX: Vitamin D deficiency, unspecified: E55.9

## 2022-03-17 NOTE — Assessment & Plan Note (Signed)
Uncontrolled and elevated at visit, states she forgot to take her meds, re minded of the importance of same DASH diet and commitment to daily physical activity for a minimum of 30 minutes discussed and encouraged, as a part of hypertension management. The importance of attaining a healthy weight is also discussed.     03/14/2022    2:14 PM 03/14/2022    1:48 PM 02/05/2022    2:14 PM 11/25/2021   10:10 AM 10/11/2021   10:23 AM 07/05/2021   10:56 AM 07/05/2021    9:18 AM  BP/Weight  Systolic BP 072 257 505 183 358 251 898  Diastolic BP 92 94 69 98 421 92 79  Wt. (Lbs)  235.12 235.12 237.4 238  234  BMI  43 kg/m2 43 kg/m2 43.42 kg/m2 43.53 kg/m2  42.8 kg/m2

## 2022-03-17 NOTE — Assessment & Plan Note (Signed)
S/s consistent with carpal tunnel

## 2022-03-17 NOTE — Assessment & Plan Note (Signed)
Needs weekly supplement x 9 months, then re eval

## 2022-03-17 NOTE — Assessment & Plan Note (Signed)
  Patient re-educated about  the importance of commitment to a  minimum of 150 minutes of exercise per week as able.  The importance of healthy food choices with portion control discussed, as well as eating regularly and within a 12 hour window most days. The need to choose "clean , green" food 50 to 75% of the time is discussed, as well as to make water the primary drink and set a goal of 64 ounces water daily.       03/14/2022    1:48 PM 02/05/2022    2:14 PM 11/25/2021   10:10 AM  Weight /BMI  Weight 235 lb 1.9 oz 235 lb 1.9 oz 237 lb 6.4 oz  Height '5\' 2"'$  (1.575 m) '5\' 2"'$  (1.575 m) '5\' 2"'$  (1.575 m)  BMI 43 kg/m2 43 kg/m2 43.42 kg/m2

## 2022-03-17 NOTE — Assessment & Plan Note (Signed)
Hyperlipidemia:Low fat diet discussed and encouraged.   Lipid Panel  Lab Results  Component Value Date   CHOL 173 03/14/2022   HDL 47 03/14/2022   LDLCALC 113 (H) 03/14/2022   TRIG 66 03/14/2022   CHOLHDL 3.7 03/14/2022     Not at goal, reduce fst intake and inc atorvastatin dose to 20 mg daily

## 2022-03-17 NOTE — Assessment & Plan Note (Signed)
US thyroid to evaluate

## 2022-03-17 NOTE — Assessment & Plan Note (Signed)
Stable and controlled currently 

## 2022-03-18 ENCOUNTER — Other Ambulatory Visit: Payer: Self-pay

## 2022-03-21 ENCOUNTER — Other Ambulatory Visit: Payer: Self-pay

## 2022-03-24 ENCOUNTER — Other Ambulatory Visit: Payer: Self-pay

## 2022-03-26 ENCOUNTER — Ambulatory Visit (HOSPITAL_COMMUNITY): Payer: Medicare Other

## 2022-04-16 ENCOUNTER — Ambulatory Visit (HOSPITAL_COMMUNITY)
Admission: RE | Admit: 2022-04-16 | Discharge: 2022-04-16 | Disposition: A | Discharge status: Home / Self Care | Payer: Medicare Other | Source: Ambulatory Visit | Attending: Family Medicine | Admitting: Family Medicine

## 2022-04-16 DIAGNOSIS — E010 Iodine-deficiency related diffuse (endemic) goiter: Secondary | ICD-10-CM | POA: Insufficient documentation

## 2022-04-16 DIAGNOSIS — E042 Nontoxic multinodular goiter: Secondary | ICD-10-CM | POA: Insufficient documentation

## 2022-04-30 ENCOUNTER — Other Ambulatory Visit: Payer: Self-pay

## 2022-05-06 ENCOUNTER — Other Ambulatory Visit: Payer: Self-pay

## 2022-05-07 ENCOUNTER — Other Ambulatory Visit: Payer: Self-pay

## 2022-05-12 NOTE — Progress Notes (Deleted)
Cardiology Office Note  Date: 05/12/2022   ID: Michelle, David 12-Apr-1961, MRN [id number]  PCP:  Fayrene Helper, MD  Cardiologist:  Rozann Lesches, MD Electrophysiologist:  None   No chief complaint on file.   History of Present Illness: Michelle David is a 61 y.o. female last seen in February.  Follow-up echocardiogram in March revealed LVEF 50 to 55% with moderate LVH and mild diastolic dysfunction.  Past Medical History:  Diagnosis Date   Cardiomyopathy [facility])    Diagnosed March 2018   CHF (congestive heart failure) ([place])    a. EF 30-35% by echo in 12/2016 b. EF improved to 45-50% in 07/2018 c. 10/2020: EF improved to 50-55%.   Dyspepsia    Essential hypertension    Hyperlipidemia    Hypoxia 11/01/2018   Impaired glucose tolerance    Multifocal pneumonia 11/01/2018   Obesity    Snoring 01/22/2018   Solitary thyroid nodule 03/01/2015   Dominant left thyroid nodule 3.2 cm noted in 02/2015     Past Surgical History:  Procedure Laterality Date   COLONOSCOPY N/A 03/19/2018   Procedure: COLONOSCOPY;  Surgeon: Danie Binder, MD;  Location: [facility];  Service: Endoscopy;  Laterality: N/A;  2:00   OOPHORECTOMY     TUBAL LIGATION      Current Outpatient Medications  Medication Sig Dispense Refill   acetaminophen (TYLENOL) 500 MG tablet Take one tablet by mouth two times daily, as needed, for arthritis pain 30 tablet 0   atorvastatin (LIPITOR) 20 MG tablet Take 1 tablet (20 mg total) by mouth daily. 30 tablet 5   carvedilol (COREG) 25 MG tablet Take 1 tablet (25 mg total) by mouth 2 (two) times daily with a meal. 180 tablet 1   ergocalciferol (VITAMIN D2) 1.25 MG (50000 UT) capsule Take 1 capsule (50,000 Units total) by mouth once a week. 12 capsule 2   fluticasone (FLONASE) 50 MCG/ACT nasal spray Place 2 sprays into both nostrils daily. 16 g 6   furosemide (LASIX) 20 MG tablet Take 1 tablet (20 mg total) by mouth daily. 90 tablet 1   losartan  (COZAAR) 100 MG tablet Take 1 tablet (100 mg total) by mouth once daily. 90 tablet 0   spironolactone (ALDACTONE) 25 MG tablet Take one tablet by mouth once daily 90 tablet 3   No current facility-administered medications for this visit.   Allergies:  Patient has no known allergies.   Social History: The patient  reports that she has never smoked. She has never used smokeless tobacco. She reports that she does not currently use alcohol. She reports that she does not use drugs.   Family History: The patient's family history includes Heart disease in her mother; Hypertension in her mother; Lung cancer in her maternal aunt.   ROS:  Please see the history of present illness. Otherwise, complete review of systems is positive for {NONE DEFAULTED:18576}.  All other systems are reviewed and negative.   Physical Exam: VS:  There were no vitals taken for this visit., BMI There is no height or weight on file to calculate BMI.  Wt Readings from Last 3 Encounters:  03/14/22 235 lb 1.9 oz (106.6 kg)  02/05/22 235 lb 1.9 oz (106.6 kg)  11/25/21 237 lb 6.4 oz (107.7 kg)    General: Patient appears comfortable at rest. HEENT: Conjunctiva and lids normal, oropharynx clear with moist mucosa. Neck: Supple, no elevated JVP or carotid bruits, no thyromegaly. Lungs: Clear to auscultation,  nonlabored breathing at rest. Cardiac: Regular rate and rhythm, no S3 or significant systolic murmur, no pericardial rub. Abdomen: Soft, nontender, no hepatomegaly, bowel sounds present, no guarding or rebound. Extremities: No pitting edema, distal pulses 2+. Skin: Warm and dry. Musculoskeletal: No kyphosis. Neuropsychiatric: Alert and oriented x3, affect grossly appropriate.  ECG:  An ECG dated 02/05/2022 was personally reviewed today and demonstrated:  Sinus rhythm with LVH and repolarization abnormalities.  Recent Labwork: 03/14/2022: ALT 18; AST 18; BUN 15; Creatinine, Ser 0.93; Hemoglobin 13.1; Platelets 352;  Potassium 4.4; Sodium 141; TSH 1.650     Component Value Date/Time   CHOL 173 03/14/2022 1429   TRIG 66 03/14/2022 1429   HDL 47 03/14/2022 1429   CHOLHDL 3.7 03/14/2022 1429   CHOLHDL 4.1 08/17/2020 1332   VLDL 26 01/15/2017 1816   LDLCALC 113 (H) 03/14/2022 1429   LDLCALC 122 (H) 08/17/2020 1332    Other Studies Reviewed Today:  Echocardiogram 12/18/2021:  1. Left ventricular ejection fraction, by estimation, is 50 to 55%. The  left ventricle has low normal function. The left ventricle has no regional  wall motion abnormalities. There is moderate concentric left ventricular  hypertrophy. Left ventricular  diastolic parameters are consistent with Grade I diastolic dysfunction  (impaired relaxation).   2. Right ventricular systolic function is normal. The right ventricular  size is normal. Tricuspid regurgitation signal is inadequate for assessing  PA pressure.   3. The mitral valve is abnormal. Trivial mitral valve regurgitation. No  evidence of mitral stenosis.   4. The aortic valve is tricuspid. Aortic valve regurgitation is not  visualized. No aortic stenosis is present.   5. The inferior vena cava is normal in size with greater than 50%  respiratory variability, suggesting right atrial pressure of 3 mmHg.   Assessment and Plan:   Medication Adjustments/Labs and Tests Ordered: Current medicines are reviewed at length with the patient today.  Concerns regarding medicines are outlined above.   Tests Ordered: No orders of the defined types were placed in this encounter.   Medication Changes: No orders of the defined types were placed in this encounter.   Disposition:  Follow up {follow up:15908}  Signed, Satira Sark, MD, [facility] 05/12/2022 3:49 PM    [facility] [address] [place], [place] [address] Phone: [phone]; Fax: [phone]

## 2022-05-14 ENCOUNTER — Ambulatory Visit: Payer: Medicare Other | Admitting: Cardiology

## 2022-05-14 DIAGNOSIS — Z8679 Personal history of other diseases of the circulatory system: Secondary | ICD-10-CM

## 2022-05-23 ENCOUNTER — Ambulatory Visit (HOSPITAL_COMMUNITY): Payer: Medicare Other

## 2022-05-26 ENCOUNTER — Ambulatory Visit (HOSPITAL_COMMUNITY): Payer: Medicare Other

## 2022-06-17 ENCOUNTER — Other Ambulatory Visit: Payer: Self-pay

## 2022-06-19 ENCOUNTER — Telehealth: Payer: Self-pay | Admitting: Family Medicine

## 2022-06-19 ENCOUNTER — Other Ambulatory Visit: Payer: Self-pay

## 2022-06-19 MED ORDER — FUROSEMIDE 20 MG PO TABS: 20.0000 mg | ORAL_TABLET | Freq: Every day | ORAL | 1 refills | Status: DC

## 2022-06-19 MED ORDER — CARVEDILOL 25 MG PO TABS
25.0000 mg | ORAL_TABLET | Freq: Two times a day (BID) | ORAL | 1 refills | Status: AC
Start: 2022-06-19 — End: ?

## 2022-06-19 MED ORDER — LOSARTAN POTASSIUM 100 MG PO TABS: 100.0000 mg | ORAL_TABLET | Freq: Every day | ORAL | 1 refills | Status: DC

## 2022-06-19 NOTE — Telephone Encounter (Signed)
Meds sent

## 2022-06-19 NOTE — Telephone Encounter (Signed)
Patient is changing pharm to [facility] all refills and new prescriptions  Phone [phone] Fax * [phone]   Needs refills on  carvedilol (COREG) 25 MG tablet   furosemide (LASIX) 20 MG tablet   losartan (COZAAR) 100 MG tablet [

## 2022-07-14 DIAGNOSIS — I5042 Chronic combined systolic (congestive) and diastolic (congestive) heart failure: Secondary | ICD-10-CM | POA: Diagnosis not present

## 2022-07-14 DIAGNOSIS — J449 Chronic obstructive pulmonary disease, unspecified: Secondary | ICD-10-CM | POA: Diagnosis not present

## 2022-07-14 DIAGNOSIS — G4730 Sleep apnea, unspecified: Secondary | ICD-10-CM | POA: Diagnosis not present

## 2022-07-14 DIAGNOSIS — I10 Essential (primary) hypertension: Secondary | ICD-10-CM | POA: Diagnosis not present

## 2022-07-14 DIAGNOSIS — Z1159 Encounter for screening for other viral diseases: Secondary | ICD-10-CM | POA: Diagnosis not present

## 2022-07-14 DIAGNOSIS — Z79899 Other long term (current) drug therapy: Secondary | ICD-10-CM | POA: Diagnosis not present

## 2022-07-14 DIAGNOSIS — I700 Atherosclerosis of aorta: Secondary | ICD-10-CM | POA: Diagnosis not present

## 2022-07-14 DIAGNOSIS — Z23 Encounter for immunization: Secondary | ICD-10-CM | POA: Diagnosis not present

## 2022-07-14 DIAGNOSIS — Z0000 Encounter for general adult medical examination without abnormal findings: Secondary | ICD-10-CM | POA: Diagnosis not present

## 2022-07-18 ENCOUNTER — Ambulatory Visit: Payer: Medicare Other | Admitting: Family Medicine

## 2022-07-18 DIAGNOSIS — J309 Allergic rhinitis, unspecified: Secondary | ICD-10-CM | POA: Diagnosis not present

## 2022-07-18 DIAGNOSIS — Z0001 Encounter for general adult medical examination with abnormal findings: Secondary | ICD-10-CM | POA: Diagnosis not present

## 2022-07-18 DIAGNOSIS — I10 Essential (primary) hypertension: Secondary | ICD-10-CM | POA: Diagnosis not present

## 2022-07-18 DIAGNOSIS — G4730 Sleep apnea, unspecified: Secondary | ICD-10-CM | POA: Diagnosis not present

## 2022-08-07 ENCOUNTER — Ambulatory Visit: Payer: Medicare Other | Admitting: Cardiology

## 2022-08-20 ENCOUNTER — Encounter: Payer: Self-pay | Admitting: Family Medicine

## 2022-08-20 ENCOUNTER — Ambulatory Visit (INDEPENDENT_AMBULATORY_CARE_PROVIDER_SITE_OTHER): Payer: Medicare PPO | Admitting: Family Medicine

## 2022-08-20 VITALS — BP 138/82 | HR 61 | Ht 62.0 in | Wt 234.0 lb

## 2022-08-20 DIAGNOSIS — I5042 Chronic combined systolic (congestive) and diastolic (congestive) heart failure: Secondary | ICD-10-CM

## 2022-08-20 DIAGNOSIS — J0100 Acute maxillary sinusitis, unspecified: Secondary | ICD-10-CM

## 2022-08-20 DIAGNOSIS — I110 Hypertensive heart disease with heart failure: Secondary | ICD-10-CM

## 2022-08-20 DIAGNOSIS — E785 Hyperlipidemia, unspecified: Secondary | ICD-10-CM

## 2022-08-20 MED ORDER — DOXYCYCLINE HYCLATE 100 MG PO TABS: 100.0000 mg | ORAL_TABLET | Freq: Two times a day (BID) | ORAL | 0 refills | Status: DC

## 2022-08-20 MED ORDER — AZELASTINE HCL 0.1 % NA SOLN: 2.0000 | Freq: Two times a day (BID) | NASAL | 12 refills | Status: AC

## 2022-08-20 NOTE — Patient Instructions (Addendum)
Best  with your health, please schedule f/u with your new PCP in [place], Dr Lemmie Evens Paliwal  Nitrofurantoin and astelin are sent to your pharmacy \ It is important that you exercise regularly at least 30 minutes 5 times a week. If you develop chest pain, have severe difficulty breathing, or feel very tired, stop exercising immediately and seek medical attention   Please get covid and shingles vaccies at your pharmacy  Thanks for choosing [facility], we consider it a privelige to serve you.

## 2022-08-24 DIAGNOSIS — J320 Chronic maxillary sinusitis: Secondary | ICD-10-CM | POA: Insufficient documentation

## 2022-08-24 NOTE — Assessment & Plan Note (Signed)
Clinically stable, no s/s of decompensation, followed by Cardiology

## 2022-08-24 NOTE — Assessment & Plan Note (Signed)
  Patient re-educated about  the importance of commitment to a  minimum of 150 minutes of exercise per week as able.  The importance of healthy food choices with portion control discussed, as well as eating regularly and within a 12 hour window most days. The need to choose "clean , green" food 50 to 75% of the time is discussed, as well as to make water the primary drink and set a goal of 64 ounces water daily.       08/20/2022   11:25 AM 03/14/2022    1:48 PM 02/05/2022    2:14 PM  Weight /BMI  Weight 234 lb 235 lb 1.9 oz 235 lb 1.9 oz  Height '5\' 2"'$  (1.575 m) '5\' 2"'$  (1.575 m) '5\' 2"'$  (1.575 m)  BMI 42.8 kg/m2 43 kg/m2 43 kg/m2

## 2022-08-24 NOTE — Assessment & Plan Note (Signed)
DASH diet and commitment to daily physical activity for a minimum of 30 minutes discussed and encouraged, as a part of hypertension management. The importance of attaining a healthy weight is also discussed. Controlled, no change in medication      08/20/2022   11:40 AM 08/20/2022   11:28 AM 08/20/2022   11:25 AM 03/14/2022    2:14 PM 03/14/2022    1:48 PM 02/05/2022    2:14 PM 11/25/2021   10:10 AM  BP/Weight  Systolic BP 734 037 096 438 381 840 375  Diastolic BP 82 80 86 92 94 69 98  Wt. (Lbs)   234  235.12 235.12 237.4  BMI   42.8 kg/m2  43 kg/m2 43 kg/m2 43.42 kg/m2

## 2022-08-24 NOTE — Assessment & Plan Note (Signed)
Hyperlipidemia:Low fat diet discussed and encouraged.   Lipid Panel  Lab Results  Component Value Date   CHOL 173 03/14/2022   HDL 47 03/14/2022   LDLCALC 113 (H) 03/14/2022   TRIG 66 03/14/2022   CHOLHDL 3.7 03/14/2022   Uncontrolled , needs to lower fat intake

## 2022-08-24 NOTE — Progress Notes (Signed)
Michelle David     MRN: [id number]      DOB: 1961/09/10   HPI Michelle David is here for follow up and re-evaluation of chronic medical conditions, medication management and review of any available recent lab and radiology data.  Preventive health is updated, specifically  Cancer screening and Immunization.   Questions or concerns regarding consultations or procedures which the PT has had in the interim are  addressed. 1 week h/o increased sinus pressure with yellow drainage, no ducumented fever or chills  No cough Transitioning to a Practice in [place] where she lives so last visit here ROS . Denies chest pains, palpitations and leg swelling Denies abdominal pain, nausea, vomiting,diarrhea or constipation.   Denies dysuria, frequency, hesitancy or incontinence. Denies joint pain, swelling and limitation in mobility. Denies headaches, seizures, numbness, or tingling. Denies depression, anxiety or insomnia. Denies skin break down or rash.   PE  BP 138/82   Pulse 61   Ht '5\' 2"'$  (1.575 m)   Wt 234 lb (106.1 kg)   SpO2 94%   BMI 42.80 kg/m   Patient alert and oriented and in no cardiopulmonary distress.Maxillary sinus tenderness  HEENT: No facial asymmetry, EOMI,     Neck supple ., no adenopathy  Chest: Clear to auscultation bilaterally.  CVS: S1, S2 systolic  murmur, no S3.Regular rate.  ABD: Soft non tender.   Ext: No edema  MS: Adequate ROM spine, shoulders, hips and knees.  Skin: Intact, no ulcerations or rash noted.  Psych: Good eye contact, normal affect. Memory intact not anxious or depressed appearing.  CNS: CN 2-12 intact, power,  normal throughout.no focal deficits noted.   Assessment & Plan  Maxillary sinusitis Antibiotic anda Astellin are prescribed  Malignant essential hypertension with congestive heart failure (HCC) DASH diet and commitment to daily physical activity for a minimum of 30 minutes discussed and encouraged, as a part of hypertension  management. The importance of attaining a healthy weight is also discussed. Controlled, no change in medication      08/20/2022   11:40 AM 08/20/2022   11:28 AM 08/20/2022   11:25 AM 03/14/2022    2:14 PM 03/14/2022    1:48 PM 02/05/2022    2:14 PM 11/25/2021   10:10 AM  BP/Weight  Systolic BP 993 570 177 939 030 092 330  Diastolic BP 82 80 86 92 94 69 98  Wt. (Lbs)   234  235.12 235.12 237.4  BMI   42.8 kg/m2  43 kg/m2 43 kg/m2 43.42 kg/m2       Chronic combined systolic and diastolic CHF (congestive heart failure) (HCC) Clinically stable, no s/s of decompensation, followed by Cardiology  Morbid obesity  Patient re-educated about  the importance of commitment to a  minimum of 150 minutes of exercise per week as able.  The importance of healthy food choices with portion control discussed, as well as eating regularly and within a 12 hour window most days. The need to choose "clean , green" food 50 to 75% of the time is discussed, as well as to make water the primary drink and set a goal of 64 ounces water daily.       08/20/2022   11:25 AM 03/14/2022    1:48 PM 02/05/2022    2:14 PM  Weight /BMI  Weight 234 lb 235 lb 1.9 oz 235 lb 1.9 oz  Height '5\' 2"'$  (1.575 m) '5\' 2"'$  (1.575 m) '5\' 2"'$  (1.575 m)  BMI 42.8 kg/m2 43 kg/m2  43 kg/m2      Hyperlipidemia LDL goal <100 Hyperlipidemia:Low fat diet discussed and encouraged.   Lipid Panel  Lab Results  Component Value Date   CHOL 173 03/14/2022   HDL 47 03/14/2022   LDLCALC 113 (H) 03/14/2022   TRIG 66 03/14/2022   CHOLHDL 3.7 03/14/2022   Uncontrolled , needs to lower fat intake

## 2022-08-24 NOTE — Assessment & Plan Note (Signed)
Antibiotic anda Astellin are prescribed

## 2022-10-28 NOTE — Progress Notes (Deleted)
Cardiology Office Note:    Date:  10/28/2022   ID:  Talesha, Ghent 1961-08-31, MRN [id number]  PCP:  Fayrene Helper, [place] [facility] Cardiologist:  Rozann Lesches, MD { Click to update primary MD,subspecialty MD or APP then REFRESH:1}    Referring MD: Fayrene Helper, MD   Chief Complaint:  No chief complaint on file. {Click here for Visit Info    :1}    History of Present Illness:   Michelle David is a 62 y.o. female with history of presumed NICM (EF 30-35% in 2018, at 45-50% by repeat imaging in 2019), HTN and HLD. She did have a repeat echocardiogram in 10/2020 which showed her EF had improved to 50 to 55% with mild MR.      Patient last saw Dr. Domenic Polite and was doing well.  Echo 12/2021 LVEF low normal 50-55%.      Past Medical History:  Diagnosis Date   Cardiomyopathy [facility])    Diagnosed March 2018   CHF (congestive heart failure) ([place])    a. EF 30-35% by echo in 12/2016 b. EF improved to 45-50% in 07/2018 c. 10/2020: EF improved to 50-55%.   Dyspepsia    Essential hypertension    Hyperlipidemia    Hypoxia 11/01/2018   Impaired glucose tolerance    Multifocal pneumonia 11/01/2018   Obesity    Snoring 01/22/2018   Solitary thyroid nodule 03/01/2015   Dominant left thyroid nodule 3.2 cm noted in 02/2015    Current Medications: No outpatient medications have been marked as taking for the 11/11/22 encounter (Appointment) with Imogene Burn, PA-C.    Allergies:   Patient has no known allergies.   Social History   Tobacco Use   Smoking status: Never   Smokeless tobacco: Never  Vaping Use   Vaping Use: Never used  Substance Use Topics   Alcohol use: Not Currently    Comment: special occasions   Drug use: No    Family Hx: The patient's family history includes Heart disease in her mother; Hypertension in her mother; Lung cancer in her maternal aunt.  ROS     Physical Exam:    VS:  There were no vitals taken for this  visit.    Wt Readings from Last 3 Encounters:  08/20/22 234 lb (106.1 kg)  03/14/22 235 lb 1.9 oz (106.6 kg)  02/05/22 235 lb 1.9 oz (106.6 kg)    Physical Exam  GEN: Well nourished, well developed, in no acute distress  HEENT: normal  Neck: no JVD, carotid bruits, or masses Cardiac:RRR; no murmurs, rubs, or gallops  Respiratory:  clear to auscultation bilaterally, normal work of breathing GI: soft, nontender, nondistended, + BS Ext: without cyanosis, clubbing, or edema, Good distal pulses bilaterally MS: no deformity or atrophy  Skin: warm and dry, no rash Neuro:  Alert and Oriented x 3, Strength and sensation are intact Psych: euthymic mood, full affect        EKGs/Labs/Other Test Reviewed:    EKG:  EKG is *** ordered today.  The ekg ordered today demonstrates ***  Recent Labs: 03/14/2022: ALT 18; BUN 15; Creatinine, Ser 0.93; Hemoglobin 13.1; Platelets 352; Potassium 4.4; Sodium 141; TSH 1.650   Recent Lipid Panel Recent Labs    03/14/22 1429  CHOL 173  TRIG 66  HDL 47  LDLCALC 113*     Prior CV Studies: {Select studies to display:26339}  Echo 12/2021 IMPRESSIONS     1. Left ventricular  ejection fraction, by estimation, is 50 to 55%. The  left ventricle has low normal function. The left ventricle has no regional  wall motion abnormalities. There is moderate concentric left ventricular  hypertrophy. Left ventricular  diastolic parameters are consistent with Grade I diastolic dysfunction  (impaired relaxation).   2. Right ventricular systolic function is normal. The right ventricular  size is normal. Tricuspid regurgitation signal is inadequate for assessing  PA pressure.   3. The mitral valve is abnormal. Trivial mitral valve regurgitation. No  evidence of mitral stenosis.   4. The aortic valve is tricuspid. Aortic valve regurgitation is not  visualized. No aortic stenosis is present.   5. The inferior vena cava is normal in size with greater than 50%   respiratory variability, suggesting right atrial pressure of 3 mmHg.   Comparison(s): No significant change from prior study.     Risk Assessment/Calculations/Metrics:   {Does this patient have ATRIAL FIBRILLATION?:[id number]}     No BP recorded.  {Refresh Note OR Click here to enter BP  :1}***    ASSESSMENT & PLAN:   No problem-specific Assessment & Plan notes found for this encounter.   HfrecEF with history of nonischemic cardiomyopathy, LVEF 50 to 55% by echocardiogram in 12/2021. Continue Coreg, losartan, and Lasix, Aldactone 12.5 mg daily.  Check BMET in 7 to 10 days.    Essential hypertension, blood pressure elevated today.  We are adding Aldactone as noted above.   Mixed hyperlipidemia, continues on Lipitor with follow-up by Dr. Moshe Cipro.  Last LDL was 109.          {Are you ordering a CV Procedure (e.g. stress test, cath, DCCV, TEE, etc)?   Press F2        :[id number]   Dispo:  No follow-ups on file.   Medication Adjustments/Labs and Tests Ordered: Current medicines are reviewed at length with the patient today.  Concerns regarding medicines are outlined above.  Tests Ordered: No orders of the defined types were placed in this encounter.  Medication Changes: No orders of the defined types were placed in this encounter.  Signed, Ermalinda Barrios, PA-C  10/28/2022 2:23 PM    [address] [place], [place], [place]  [address] Phone: [phone]; Fax: [phone]

## 2022-11-11 ENCOUNTER — Ambulatory Visit: Payer: Medicare Other | Admitting: Physician Assistant

## 2022-11-12 ENCOUNTER — Encounter: Payer: Self-pay | Admitting: Physician Assistant

## 2022-11-24 DIAGNOSIS — R0982 Postnasal drip: Secondary | ICD-10-CM | POA: Diagnosis not present

## 2022-12-04 ENCOUNTER — Telehealth: Payer: Self-pay | Admitting: Cardiology

## 2022-12-04 NOTE — Telephone Encounter (Signed)
Patient would like to switch from Dr. Domenic Polite to Dr. Johney Frame at [facility] is closer to her home.  Are you both in agreement with this?

## 2023-01-15 NOTE — Progress Notes (Deleted)
Office Visit    Patient Name: Michelle David Date of Encounter: 01/15/2023  PCP:  Fayrene Helper, [place]  Cardiologist:  Rozann Lesches, MD  Advanced Practice Provider:  No care team member to display Electrophysiologist:  None   HPI    Michelle David is a 62 y.o. female with a past medical history of cardiomyopathy (March 2018), CHF (EF 30 to 35% by echocardiogram 12/2016 and improved to 45 to 50% in 2019, then further improved to 50 to 55% in 2022), hypertension, hyperlipidemia, snoring, obesity presents today for follow-up appointment.  She was seen August 2022.  Reported significant weight change, stable NYHA class II dyspnea.  No palpitations or syncope.  She was last seen 11/25/2021 and remained on low-dose Lasix (20 mg daily).  Her last echocardiogram January 2022 at which point LVEF was 50 to 55% range with mild LVH and normal RV contraction, mild mitral regurgitation at that time.  Today, she***  Past Medical History    Past Medical History:  Diagnosis Date   Cardiomyopathy [facility])    Diagnosed March 2018   CHF (congestive heart failure) ([place])    a. EF 30-35% by echo in 12/2016 b. EF improved to 45-50% in 07/2018 c. 10/2020: EF improved to 50-55%.   Dyspepsia    Essential hypertension    Hyperlipidemia    Hypoxia 11/01/2018   Impaired glucose tolerance    Multifocal pneumonia 11/01/2018   Obesity    Snoring 01/22/2018   Solitary thyroid nodule 03/01/2015   Dominant left thyroid nodule 3.2 cm noted in 02/2015    Past Surgical History:  Procedure Laterality Date   COLONOSCOPY N/A 03/19/2018   Procedure: COLONOSCOPY;  Surgeon: Danie Binder, MD;  [facility];  Service: Endoscopy;  Laterality: N/A;  2:00   OOPHORECTOMY     TUBAL LIGATION      Allergies  No Known Allergies  EKGs/Labs/Other Studies Reviewed:   The following studies were reviewed today:   Echocardiogram 10/30/2020:  1. Left ventricular  ejection fraction, by estimation, is 50 to 55%. The  left ventricle has low normal function. The left ventricle has no regional  wall motion abnormalities. There is mild left ventricular hypertrophy.  Left ventricular diastolic  parameters are indeterminate.   2. Right ventricular systolic function is normal. The right ventricular  size is normal. Tricuspid regurgitation signal is inadequate for assessing  PA pressure.   3. The mitral valve is grossly normal. Mild mitral valve regurgitation.   4. The aortic valve is tricuspid. Aortic valve regurgitation is trivial.   5. The inferior vena cava is normal in size with greater than 50%  respiratory variability, suggesting right atrial pressure of 3 mmHg.      EKG:  EKG is *** ordered today.  The ekg ordered today demonstrates ***  Recent Labs: 03/14/2022: ALT 18; BUN 15; Creatinine, Ser 0.93; Hemoglobin 13.1; Platelets 352; Potassium 4.4; Sodium 141; TSH 1.650  Recent Lipid Panel    Component Value Date/Time   CHOL 173 03/14/2022 1429   TRIG 66 03/14/2022 1429   HDL 47 03/14/2022 1429   CHOLHDL 3.7 03/14/2022 1429   CHOLHDL 4.1 08/17/2020 1332   VLDL 26 01/15/2017 1816   LDLCALC 113 (H) 03/14/2022 1429   LDLCALC 122 (H) 08/17/2020 1332    Risk Assessment/Calculations:  {Does this patient have ATRIAL FIBRILLATION?:[phone]}  Home Medications   No outpatient medications have been marked as taking for the  01/16/23 encounter (Appointment) with Elgie Collard, PA-C.     Review of Systems   ***   All other systems reviewed and are otherwise negative except as noted above.  Physical Exam    VS:  There were no vitals taken for this visit. , BMI There is no height or weight on file to calculate BMI.  Wt Readings from Last 3 Encounters:  08/20/22 234 lb (106.1 kg)  03/14/22 235 lb 1.9 oz (106.6 kg)  02/05/22 235 lb 1.9 oz (106.6 kg)     GEN: Well nourished, well developed, in no acute distress. HEENT: normal. Neck: Supple, no  JVD, carotid bruits, or masses. Cardiac: ***RRR, no murmurs, rubs, or gallops. No clubbing, cyanosis, edema.  ***Radials/PT 2+ and equal bilaterally.  Respiratory:  ***Respirations regular and unlabored, clear to auscultation bilaterally. GI: Soft, nontender, nondistended. MS: No deformity or atrophy. Skin: Warm and dry, no rash. Neuro:  Strength and sensation are intact. Psych: Normal affect.  Assessment & Plan    HFrEF Hypertension Hyperlipidemia  No BP recorded.  {Refresh Note OR Click here to enter BP  :1}***      Disposition: Follow up {follow up:15908} with Rozann Lesches, MD or APP.  Signed, Elgie Collard, PA-C 01/15/2023, 9:04 PM [place]

## 2023-01-16 ENCOUNTER — Ambulatory Visit: Payer: Medicare Other | Admitting: Physician Assistant

## 2023-01-16 DIAGNOSIS — I10 Essential (primary) hypertension: Secondary | ICD-10-CM

## 2023-01-16 DIAGNOSIS — E785 Hyperlipidemia, unspecified: Secondary | ICD-10-CM

## 2023-01-16 DIAGNOSIS — I5020 Unspecified systolic (congestive) heart failure: Secondary | ICD-10-CM

## 2023-01-21 ENCOUNTER — Other Ambulatory Visit: Payer: Self-pay

## 2023-01-21 ENCOUNTER — Other Ambulatory Visit (HOSPITAL_COMMUNITY): Payer: Self-pay

## 2023-01-29 ENCOUNTER — Ambulatory Visit: Payer: Medicare Other | Admitting: Physician Assistant

## 2023-03-03 ENCOUNTER — Encounter: Payer: Self-pay | Admitting: *Deleted

## 2023-03-11 NOTE — Progress Notes (Deleted)
Office Visit    Patient Name: Michelle David Date of Encounter: 03/11/2023  PCP:  Kerri Perches, MD   [facility]  Cardiologist:  Nona Dell, MD  Advanced Practice Provider:  No care team member to display Electrophysiologist:  None    HPI    Michelle David is a 62 y.o. female with a past medical history of CHF (EF 30 to 35% by echocardiogram 12/2016), hypertension, hyperlipidemia, obesity, thyroid nodule presents today for follow-up appointment.  She was seen November 25, 2021 by Dr. Diona Browner and at that time she was not having any palpitations or syncope.  No significant change in weight, stable NYHA class II dyspnea.  Medications were reviewed and she had not taken them before her appointment.  Lasix remains at low-dose of 20 mg daily.  Last echocardiogram January 2022 at which point LVEF was 50 to 55% range with mild LVH and normal RV contraction, mild mitral regurgitation at that time.  Today, she ***  Past Medical History    Past Medical History:  Diagnosis Date   Cardiomyopathy [facility])    Diagnosed March 2018   CHF (congestive heart failure) ([facility])    a. EF 30-35% by echo in 12/2016 b. EF improved to 45-50% in 07/2018 c. 10/2020: EF improved to 50-55%.   Dyspepsia    Essential hypertension    Hyperlipidemia    Hypoxia 11/01/2018   Impaired glucose tolerance    Multifocal pneumonia 11/01/2018   Obesity    Snoring 01/22/2018   Solitary thyroid nodule 03/01/2015   Dominant left thyroid nodule 3.2 cm noted in 02/2015    Past Surgical History:  Procedure Laterality Date   COLONOSCOPY N/A 03/19/2018   Procedure: COLONOSCOPY;  Surgeon: West Bali, MD;  Location[facility];  Service: Endoscopy;  Laterality: N/A;  2:00   OOPHORECTOMY     TUBAL LIGATION      Allergies  No Known Allergies  EKGs/Labs/Other Studies Reviewed:   The following studies were reviewed today: Cardiac Studies & Procedures        ECHOCARDIOGRAM  ECHOCARDIOGRAM COMPLETE 12/18/2021  Narrative ECHOCARDIOGRAM REPORT    Patient Name:   Michelle David Date of Exam: 12/18/2021 Medical Rec #:  [id number]         Height:       62.0 in Accession #:    [id number]        Weight:       237.4 lb Date of Birth:  [birth year]        BSA:          2.056 m Patient Age:    60 years          BP:           171/90 mmHg Patient Gender: F                 HR:           68 bpm. Exam Location:  [facility]  Procedure: 2D Echo, Cardiac Doppler and Color Doppler  Indications:    Cardiomyopathy  History:        Patient has prior history of Echocardiogram examinations, most recent 11/10/2020. Cardiomyopathy and CHF, Signs/Symptoms:Murmur; Risk Factors:Hypertension and Dyslipidemia.  Sonographer:    Mikki Harbor Referring Phys: [id number] SAMUEL G MCDOWELL   Sonographer Comments: Patient is morbidly obese. IMPRESSIONS   1. Left ventricular ejection fraction, by estimation, is 50 to 55%. The left ventricle has low normal  function. The left ventricle has no regional wall motion abnormalities. There is moderate concentric left ventricular hypertrophy. Left ventricular diastolic parameters are consistent with Grade I diastolic dysfunction (impaired relaxation). 2. Right ventricular systolic function is normal. The right ventricular size is normal. Tricuspid regurgitation signal is inadequate for assessing PA pressure. 3. The mitral valve is abnormal. Trivial mitral valve regurgitation. No evidence of mitral stenosis. 4. The aortic valve is tricuspid. Aortic valve regurgitation is not visualized. No aortic stenosis is present. 5. The inferior vena cava is normal in size with greater than 50% respiratory variability, suggesting right atrial pressure of 3 mmHg.  Comparison(s): No significant change from prior study.  FINDINGS Left Ventricle: Left ventricular ejection fraction, by estimation, is 50 to 55%. The left ventricle has low normal  function. The left ventricle has no regional wall motion abnormalities. The left ventricular internal cavity size was normal in size. There is moderate concentric left ventricular hypertrophy. Left ventricular diastolic parameters are consistent with Grade I diastolic dysfunction (impaired relaxation).  Right Ventricle: The right ventricular size is normal. Right vetricular wall thickness was not well visualized. Right ventricular systolic function is normal. Tricuspid regurgitation signal is inadequate for assessing PA pressure.  Left Atrium: Left atrial size was normal in size.  Right Atrium: Right atrial size was normal in size.  Pericardium: There is no evidence of pericardial effusion.  Mitral Valve: Abnormal thickening for age. The mitral valve is abnormal. Trivial mitral valve regurgitation. No evidence of mitral valve stenosis. MV peak gradient, 6.7 mmHg. The mean mitral valve gradient is 2.0 mmHg.  Tricuspid Valve: The tricuspid valve is normal in structure. Tricuspid valve regurgitation is not demonstrated. No evidence of tricuspid stenosis.  Aortic Valve: The aortic valve is tricuspid. Aortic valve regurgitation is not visualized. No aortic stenosis is present. Aortic valve mean gradient measures 4.0 mmHg. Aortic valve peak gradient measures 9.0 mmHg. Aortic valve area, by VTI measures 2.25 cm.  Pulmonic Valve: The pulmonic valve was not well visualized. Pulmonic valve regurgitation is not visualized. No evidence of pulmonic stenosis.  Aorta: The aortic root is normal in size and structure.  Venous: The inferior vena cava is normal in size with greater than 50% respiratory variability, suggesting right atrial pressure of 3 mmHg.  IAS/Shunts: The atrial septum is grossly normal.   LEFT VENTRICLE PLAX 2D LVIDd:         4.65 cm     Diastology LVIDs:         3.20 cm     LV e' medial:    5.66 cm/s LV PW:         1.35 cm     LV E/e' medial:  12.0 LV IVS:        1.40 cm     LV e'  lateral:   6.09 cm/s LVOT diam:     2.00 cm     LV E/e' lateral: 11.2 LV SV:         69 LV SV Index:   33 LVOT Area:     3.14 cm  LV Volumes (MOD) LV vol d, MOD A2C: 75.2 ml LV vol d, MOD A4C: 86.4 ml LV vol s, MOD A2C: 35.9 ml LV vol s, MOD A4C: 43.8 ml LV SV MOD A2C:     39.3 ml LV SV MOD A4C:     86.4 ml LV SV MOD BP:      41.4 ml  RIGHT VENTRICLE RV Basal diam:  3.40 cm RV Mid  diam:    3.20 cm RV S prime:     10.70 cm/s TAPSE (M-mode): 2.2 cm  LEFT ATRIUM             Index        RIGHT ATRIUM           Index LA diam:        4.10 cm 1.99 cm/m   RA Area:     13.70 cm LA Vol (A2C):   69.9 ml 34.00 ml/m  RA Volume:   36.40 ml  17.70 ml/m LA Vol (A4C):   63.8 ml 31.03 ml/m LA Biplane Vol: 67.5 ml 32.83 ml/m AORTIC VALVE                    PULMONIC VALVE AV Area (Vmax):    1.85 cm     PV Vmax:       1.05 m/s AV Area (Vmean):   1.90 cm     PV Peak grad:  4.4 mmHg AV Area (VTI):     2.25 cm AV Vmax:           150.00 cm/s AV Vmean:          89.200 cm/s AV VTI:            0.306 m AV Peak Grad:      9.0 mmHg AV Mean Grad:      4.0 mmHg LVOT Vmax:         88.10 cm/s LVOT Vmean:        53.900 cm/s LVOT VTI:          0.219 m LVOT/AV VTI ratio: 0.72  AORTA Ao Root diam: 2.90 cm  MITRAL VALVE MV Area (PHT): 2.99 cm     SHUNTS MV Area VTI:   2.06 cm     Systemic VTI:  0.22 m MV Peak grad:  6.7 mmHg     Systemic Diam: 2.00 cm MV Mean grad:  2.0 mmHg MV Vmax:       1.29 m/s MV Vmean:      61.6 cm/s MV Decel Time: 254 msec MV E velocity: 68.10 cm/s MV A velocity: 126.00 cm/s MV E/A ratio:  0.54  Riley Lam MD Electronically signed by Riley Lam MD Signature Date/Time: 12/18/2021/3:37:08 PM    Final              EKG:  EKG is *** ordered today.  The ekg ordered today demonstrates ***  Recent Labs: 03/14/2022: ALT 18; BUN 15; Creatinine, Ser 0.93; Hemoglobin 13.1; Platelets 352; Potassium 4.4; Sodium 141; TSH 1.650  Recent Lipid Panel     Component Value Date/Time   CHOL 173 03/14/2022 1429   TRIG 66 03/14/2022 1429   HDL 47 03/14/2022 1429   CHOLHDL 3.7 03/14/2022 1429   CHOLHDL 4.1 08/17/2020 1332   VLDL 26 01/15/2017 1816   LDLCALC 113 (H) 03/14/2022 1429   LDLCALC 122 (H) 08/17/2020 1332    Risk Assessment/Calculations:  {Does this patient have ATRIAL FIBRILLATION?:[id number]}  Home Medications   No outpatient medications have been marked as taking for the 03/12/23 encounter (Appointment) with Sharlene Dory, PA-C.     Review of Systems   ***   All other systems reviewed and are otherwise negative except as noted above.  Physical Exam    VS:  There were no vitals taken for this visit. , BMI There is no height or weight on file to calculate BMI.  Wt Readings from  Last 3 Encounters:  08/20/22 234 lb (106.1 kg)  03/14/22 235 lb 1.9 oz (106.6 kg)  02/05/22 235 lb 1.9 oz (106.6 kg)     GEN: Well nourished, well developed, in no acute distress. HEENT: normal. Neck: Supple, no JVD, carotid bruits, or masses. Cardiac: ***RRR, no murmurs, rubs, or gallops. No clubbing, cyanosis, edema.  ***Radials/PT 2+ and equal bilaterally.  Respiratory:  ***Respirations regular and unlabored, clear to auscultation bilaterally. GI: Soft, nontender, nondistended. MS: No deformity or atrophy. Skin: Warm and dry, no rash. Neuro:  Strength and sensation are intact. Psych: Normal affect.  Assessment & Plan    HFrEF Hypertension Hyperlipidemia  No BP recorded.  {Refresh Note OR Click here to enter BP  :1}***      Disposition: Follow up {follow up:15908} with Nona Dell, MD or APP.  Signed, Sharlene Dory, PA-C 03/11/2023, 9:27 PM [facility]

## 2023-03-12 ENCOUNTER — Ambulatory Visit: Payer: Medicare HMO | Attending: Cardiology | Admitting: Physician Assistant

## 2023-03-12 DIAGNOSIS — I10 Essential (primary) hypertension: Secondary | ICD-10-CM

## 2023-03-12 DIAGNOSIS — E785 Hyperlipidemia, unspecified: Secondary | ICD-10-CM

## 2023-03-12 DIAGNOSIS — I5020 Unspecified systolic (congestive) heart failure: Secondary | ICD-10-CM

## 2023-03-13 ENCOUNTER — Encounter: Payer: Self-pay | Admitting: Physician Assistant

## 2023-07-21 ENCOUNTER — Other Ambulatory Visit: Payer: Self-pay

## 2023-10-27 ENCOUNTER — Ambulatory Visit: Payer: Medicare Other | Admitting: General Practice

## 2023-11-18 ENCOUNTER — Ambulatory Visit (INDEPENDENT_AMBULATORY_CARE_PROVIDER_SITE_OTHER): Payer: Medicare Other | Admitting: General Practice

## 2023-11-18 ENCOUNTER — Encounter: Payer: Self-pay | Admitting: General Practice

## 2023-11-18 VITALS — BP 194/106 | HR 64 | Temp 98.1°F | Ht 62.0 in | Wt 226.6 lb

## 2023-11-18 DIAGNOSIS — M545 Low back pain, unspecified: Secondary | ICD-10-CM

## 2023-11-18 DIAGNOSIS — I5042 Chronic combined systolic (congestive) and diastolic (congestive) heart failure: Secondary | ICD-10-CM

## 2023-11-18 DIAGNOSIS — I110 Hypertensive heart disease with heart failure: Secondary | ICD-10-CM

## 2023-11-18 DIAGNOSIS — R011 Cardiac murmur, unspecified: Secondary | ICD-10-CM

## 2023-11-18 DIAGNOSIS — Z7689 Persons encountering health services in other specified circumstances: Secondary | ICD-10-CM

## 2023-11-18 DIAGNOSIS — E785 Hyperlipidemia, unspecified: Secondary | ICD-10-CM | POA: Diagnosis not present

## 2023-11-18 MED ORDER — CYCLOBENZAPRINE HCL 5 MG PO TABS: 5.0000 mg | ORAL_TABLET | Freq: Three times a day (TID) | ORAL | 0 refills | Status: DC | PRN

## 2023-11-18 MED ORDER — LOSARTAN POTASSIUM-HCTZ 100-12.5 MG PO TABS: 1.0000 | ORAL_TABLET | Freq: Every day | ORAL | 0 refills | Status: DC

## 2023-11-18 NOTE — Assessment & Plan Note (Signed)
EMR reviewed briefly.

## 2023-11-18 NOTE — Assessment & Plan Note (Addendum)
Symptoms suggestive of muscle spasm vs muscle strain.  Decreased ROM with rotation.   Discussed with patient to continue supportive care with RICE.  Rx sent for cyclobenzaprine 5 mg TID as needed.   Follow up in 2 weeks.

## 2023-11-18 NOTE — Assessment & Plan Note (Addendum)
No red flags on exam today.  BP readings elevated on both readings. Asymptomatic.  Patient has not been taking medications as prescribed.  As not been seen by cardiology in over a year.   Discussed at length the importance of medication adherence as the patient is at increased risk for stroke, MI.   Discontinue losartan 100 mg. Start Losartan-hydrochlorothiazide 12.5 mg once daily.  Continue coreg twice daily, spironolactone 25 mg once daily and Lasix once daily.   Discussed the importance of low sodium intake and exercise.  Referral placed for cardiology.

## 2023-11-18 NOTE — Assessment & Plan Note (Signed)
Not heard on exam today.   No red flags on exam.

## 2023-11-18 NOTE — Assessment & Plan Note (Signed)
No signs of hypervolemia. Exam stable.   Referral placed for cardiology

## 2023-11-18 NOTE — Patient Instructions (Signed)
Take medications as scheduled for two weeks.   Keep blood pressure log and bring it back.   Take flexeril as needed for back pain.   Follow up in 2 weeks for hypertension and physical.

## 2023-11-18 NOTE — Progress Notes (Signed)
New Patient Office Visit  Subjective    Patient ID: Michelle David, female    DOB: [birth year]  Age: 63 y.o. MRN: [id number]  CC:  Chief Complaint  Patient presents with   Establish Care    NP. Est. Care, concerns with lower back pain and HTN     HPI Michelle David is a 63 y.o. female presents to establish care, discuss lower back pain and HTN.  Last physical/PCP/labs: 2023  Hypertension/CHF: diagnosed several years ago. Currently managed on carvedilol 25 mg once daily, losartan 100 mg once daily, spironolactone 25 mg and furosemide 20 mg once daily. She hasn't seen her cardiologist in over a year. She has been going to a office called [facility] who has been filling her medications.   Low back pain: 6 months ago. 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 Feels like she pulled something. Denies any urinary or stool incontinence. She has tried heat, ibuprofen and it has given her minimal relief. Her pain is triggered when she is on her feet too long. She has not falls or injury. Intermittent. Mid.    Outpatient Encounter Medications as of 11/18/2023  Medication Sig   acetaminophen (TYLENOL) 500 MG tablet Take one tablet by mouth two times daily, as needed, for arthritis pain   albuterol (VENTOLIN HFA) 108 (90 Base) MCG/ACT inhaler Inhale into the lungs every 6 (six) hours as needed for wheezing or shortness of breath.   atorvastatin (LIPITOR) 20 MG tablet Take 1 tablet (20 mg total) by mouth daily.   azelastine (ASTELIN) 0.1 % nasal spray Place 2 sprays into both nostrils 2 (two) times daily. Use in each nostril as directed   carvedilol (COREG) 25 MG tablet Take 1 tablet (25 mg total) by mouth 2 (two) times daily with a meal.   cyclobenzaprine (FLEXERIL) 5 MG tablet Take 1 tablet (5 mg total) by mouth 3 (three) times daily as needed for muscle spasms.   fluticasone (FLONASE) 50 MCG/ACT nasal spray Place 2 sprays into both nostrils daily.    losartan-hydrochlorothiazide (HYZAAR) 100-12.5 MG tablet Take 1 tablet by mouth daily.   spironolactone (ALDACTONE) 25 MG tablet Take one tablet by mouth once daily   Tiotropium Bromide Monohydrate (SPIRIVA RESPIMAT) 2.5 MCG/ACT AERS Inhale into the lungs as needed.   [DISCONTINUED] losartan (COZAAR) 100 MG tablet Take 1 tablet (100 mg total) by mouth once daily.   doxycycline (VIBRA-TABS) 100 MG tablet Take 1 tablet (100 mg total) by mouth 2 (two) times daily. (Patient not taking: Reported on 11/18/2023)   ergocalciferol (VITAMIN D2) 1.25 MG (50000 UT) capsule Take 1 capsule (50,000 Units total) by mouth once a week. (Patient not taking: Reported on 11/18/2023)   furosemide (LASIX) 20 MG tablet Take 1 tablet (20 mg total) by mouth daily. (Patient not taking: Reported on 11/18/2023)   No facility-administered encounter medications on file as of 11/18/2023.    Past Medical History:  Diagnosis Date   Blood in stool    Cardiomyopathy [facility])    Diagnosed March 2018   CHF (congestive heart failure) (HCC)    a. EF 30-35% by echo in 12/2016 b. EF improved to 45-50% in 07/2018 c. 10/2020: EF improved to 50-55%.   Dyspepsia    Essential hypertension    Heart disease    Heart murmur    Hyperlipidemia    Hypoxia 11/01/2018   Impaired glucose tolerance    Insomnia due  to anxiety and fear 07/08/2021   Multifocal pneumonia 11/01/2018   Obesity    Snoring 01/22/2018   Solitary thyroid nodule 03/01/2015   Dominant left thyroid nodule 3.2 cm noted in 02/2015     Past Surgical History:  Procedure Laterality Date   ABDOMINAL HYSTERECTOMY     COLONOSCOPY N/A 03/19/2018   Procedure: COLONOSCOPY;  Surgeon: West Bali, MD;  Location: [facility];  Service: Endoscopy;  Laterality: N/A;  2:00   OOPHORECTOMY     TUBAL LIGATION      Family History  Problem Relation Age of Onset   Heart disease Mother        Died in her 34s with MI   Hypertension Mother    Depression Mother    Early death  Mother    Heart attack Mother    Alcohol abuse Brother    High Cholesterol Daughter    High blood pressure Daughter    Depression Son    High blood pressure Son    High Cholesterol Son    Lung cancer Maternal Aunt     Social History   Socioeconomic History   Marital status: Married    Spouse name: Molly Maduro   Number of children: 3   Years of education: 12   Highest education level: 12th grade  Occupational History   Occupation: Office manager: [employer]  Tobacco Use   Smoking status: Never    Passive exposure: Never   Smokeless tobacco: Never  Vaping Use   Vaping status: Never Used  Substance and Sexual Activity   Alcohol use: Not Currently    Comment: special occasions   Drug use: No   Sexual activity: Not Currently    Partners: Male  Other Topics Concern   Not on file  Social History Narrative   Not on file   Social Drivers of Health   Financial Resource Strain: Not on file  Food Insecurity: Not on file  Transportation Needs: Not on file  Physical Activity: Not on file  Stress: Not on file  Social Connections: Not on file  Intimate Partner Violence: Not on file    Review of Systems  Constitutional:  Negative for chills and fever.  Eyes:  Negative for blurred vision.  Respiratory:  Negative for shortness of breath.   Cardiovascular:  Negative for chest pain.  Gastrointestinal:  Negative for abdominal pain, constipation, diarrhea, heartburn, nausea and vomiting.  Genitourinary:  Negative for dysuria, frequency and urgency.  Musculoskeletal:  Positive for back pain.  Neurological:  Negative for dizziness and headaches.  Endo/Heme/Allergies:  Negative for polydipsia.  Psychiatric/Behavioral:  Negative for depression and suicidal ideas. The patient is not nervous/anxious.         Objective    BP (!) 194/106 (BP Location: Right Arm, Cuff Size: Large)   Pulse 64   Temp 98.1 F (36.7 C)   Ht 5\' 2"  (1.575 m)   Wt 226 lb 9.6 oz  (102.8 kg)   SpO2 97%   BMI 41.45 kg/m   Physical Exam Vitals and nursing note reviewed.  Constitutional:      Appearance: Normal appearance.  Cardiovascular:     Rate and Rhythm: Normal rate and regular rhythm.     Pulses: Normal pulses.     Heart sounds: Normal heart sounds.  Pulmonary:     Effort: Pulmonary effort is normal.     Breath sounds: Normal breath sounds.  Neurological:     Mental Status:  She is alert and oriented to person, place, and time.  Psychiatric:        Mood and Affect: Mood normal.        Behavior: Behavior normal.        Thought Content: Thought content normal.        Judgment: Judgment normal.         Assessment & Plan:  Malignant essential hypertension with congestive heart failure (HCC) Assessment & Plan: No red flags on exam today.  BP readings elevated on both readings. Asymptomatic.  Patient has not been taking medications as prescribed.  As not been seen by cardiology in over a year.   Discussed at length the importance of medication adherence as the patient is at increased risk for stroke, MI.   Discontinue losartan 100 mg. Start Losartan-hydrochlorothiazide 12.5 mg once daily.  Continue coreg twice daily, spironolactone 25 mg once daily and Lasix once daily.   Discussed the importance of low sodium intake and exercise.  Referral placed for cardiology.   Orders: -     Ambulatory referral to Cardiology -     Losartan Potassium-HCTZ; Take 1 tablet by mouth daily.  Dispense: 30 tablet; Refill: 0  Chronic combined systolic and diastolic CHF (congestive heart failure) (HCC) Assessment & Plan: No signs of hypervolemia. Exam stable.   Referral placed for cardiology  Orders: -     Ambulatory referral to Cardiology  Heart murmur Assessment & Plan: Not heard on exam today.   No red flags on exam.  Orders: -     Ambulatory referral to Cardiology  Hyperlipidemia LDL goal <100 Assessment & Plan: Continue Atorvastatin.    Pending labs from previous doctor for review.   Orders: -     Ambulatory referral to Cardiology  Acute midline low back pain without sciatica Assessment & Plan: Symptoms suggestive of muscle spasm vs muscle strain.  Decreased ROM with rotation.   Discussed with patient to continue supportive care with RICE.  Rx sent for cyclobenzaprine 5 mg TID as needed.   Follow up in 2 weeks.   Orders: -     Cyclobenzaprine HCl; Take 1 tablet (5 mg total) by mouth 3 (three) times daily as needed for muscle spasms.  Dispense: 30 tablet; Refill: 0  Establishing care with new doctor, encounter for Assessment & Plan: EMR reviewed briefly.      Return in about 2 weeks (around 12/02/2023) for physical and HTN.   Modesto Charon, NP

## 2023-11-18 NOTE — Assessment & Plan Note (Signed)
Continue Atorvastatin.   Pending labs from previous doctor for review.

## 2023-11-24 ENCOUNTER — Encounter: Payer: Self-pay | Admitting: Medical

## 2023-11-24 ENCOUNTER — Ambulatory Visit: Payer: Medicare HMO | Attending: Medical | Admitting: Medical

## 2023-11-24 VITALS — BP 132/90 | HR 71 | Ht 62.0 in | Wt 229.0 lb

## 2023-11-24 DIAGNOSIS — E782 Mixed hyperlipidemia: Secondary | ICD-10-CM

## 2023-11-24 DIAGNOSIS — Z79899 Other long term (current) drug therapy: Secondary | ICD-10-CM | POA: Diagnosis not present

## 2023-11-24 DIAGNOSIS — I5042 Chronic combined systolic (congestive) and diastolic (congestive) heart failure: Secondary | ICD-10-CM

## 2023-11-24 DIAGNOSIS — R011 Cardiac murmur, unspecified: Secondary | ICD-10-CM

## 2023-11-24 DIAGNOSIS — I428 Other cardiomyopathies: Secondary | ICD-10-CM | POA: Diagnosis not present

## 2023-11-24 DIAGNOSIS — I10 Essential (primary) hypertension: Secondary | ICD-10-CM | POA: Diagnosis not present

## 2023-11-24 NOTE — Patient Instructions (Signed)
Medication Instructions:  Your physician recommends that you continue on your current medications as directed. Please refer to the Current Medication list given to you today.   *If you need a refill on your cardiac medications before your next appointment, please call your pharmacy*   Lab Work: Your provider would like for you to have following labs drawn today (CMP, CBC, TSH, A1C, Lipid, direct LDL).     Testing/Procedures: Your physician has requested that you have an echocardiogram. Echocardiography is a painless test that uses sound waves to create images of your heart. It provides your doctor with information about the size and shape of your heart and how well your heart's chambers and valves are working.   You may receive an ultrasound enhancing agent through an IV if needed to better visualize your heart during the echo. This procedure takes approximately one hour.  There are no restrictions for this procedure.  This will take place at [address] [facility] [address], [place] [address]  Please note: We ask at that you not bring children with you during ultrasound (echo/ vascular) testing. Due to room size and safety concerns, children are not allowed in the ultrasound rooms during exams. Our front office staff cannot provide observation of children in our lobby area while testing is being conducted. An adult accompanying a patient to their appointment will only be allowed in the ultrasound room at the discretion of the ultrasound technician under special circumstances. We apologize for any inconvenience.    Follow-Up: At [facility], you and your health needs are our priority.  As part of our continuing mission to provide you with exceptional heart care, we have created designated Provider Care Teams.  These Care Teams include your primary Cardiologist (physician) and Advanced Practice Providers (APPs -  Physician Assistants and Nurse Practitioners) who all  work together to provide you with the care you need, when you need it.  We recommend signing up for the patient portal called "MyChart".  Sign up information is provided on this After Visit Summary.  MyChart is used to connect with patients for Virtual Visits (Telemedicine).  Patients are able to view lab/test results, encounter notes, upcoming appointments, etc.  Non-urgent messages can be sent to your provider as well.   To learn more about what you can do with MyChart, go to [email or web address].    Your next appointment:   6 month(s)  Provider:   You may see Nona Dell, MD or one of the following Advanced Practice Providers on your designated Care Team:   Nicolasa Ducking, NP Eula Listen, PA-C Cadence Fransico Michael, PA-C Charlsie Quest, NP Carlos Levering, NP

## 2023-11-24 NOTE — Progress Notes (Addendum)
 Cardiology Office Note:  .   Date:  11/24/2023  ID:  Michelle David, DOB [birth year], MRN [id number] PCP: Vincente Shivers, NP  [facility] Providers Cardiologist:  Jayson Sierras, MD     History of Present Illness: .   Michelle David is a 63 y.o. female with a history of presumed nonischemic cardiomyopathy  (EF 30 to 35% in 2018, 45 to 50% by repeat echo 2019), hypertension and hyperlipidemia presents for follow-up.  Patient was last seen February 2023 and was stable from a cardiac perspective repeat echo was ordered and spironolactone  was added.  The patient moved to [place] and will be followed in [place].   Today, the patient reports she has been doing OK. She denies chest pain. She has SOB when she exerts herself, walking up hill. She denies lower leg edema.no recent fever, chills, N/V. Rare lightheadedness and dizziness.    Studies Reviewed: .       Echo March 2023 1. Left ventricular ejection fraction, by estimation, is 50 to 55%. The  left ventricle has low normal function. The left ventricle has no regional  wall motion abnormalities. There is moderate concentric left ventricular  hypertrophy. Left ventricular  diastolic parameters are consistent with Grade I diastolic dysfunction  (impaired relaxation).   2. Right ventricular systolic function is normal. The right ventricular  size is normal. Tricuspid regurgitation signal is inadequate for assessing  PA pressure.   3. The mitral valve is abnormal. Trivial mitral valve regurgitation. No  evidence of mitral stenosis.   4. The aortic valve is tricuspid. Aortic valve regurgitation is not  visualized. No aortic stenosis is present.   5. The inferior vena cava is normal in size with greater than 50%  respiratory variability, suggesting right atrial pressure of 3 mmHg.   Comparison(s): No significant change from prior study.   Echo January 2022  1. Left ventricular ejection fraction, by estimation, is 50 to  55%. The  left ventricle has low normal function. The left ventricle has no regional  wall motion abnormalities. There is mild left ventricular hypertrophy.  Left ventricular diastolic  parameters are indeterminate.   2. Right ventricular systolic function is normal. The right ventricular  size is normal. Tricuspid regurgitation signal is inadequate for assessing  PA pressure.   3. The mitral valve is grossly normal. Mild mitral valve regurgitation.   4. The aortic valve is tricuspid. Aortic valve regurgitation is trivial.   5. The inferior vena cava is normal in size with greater than 50%  respiratory variability, suggesting right atrial pressure of 3 mmHg.   Echo October 2019 Study Conclusions   - Left ventricle: The cavity size was normal. Wall thickness was    increased in a pattern of moderate LVH. Systolic function was    mildly reduced. The estimated ejection fraction was in the range    of 45% to 50%. Doppler parameters are consistent with abnormal    left ventricular relaxation (grade 1 diastolic dysfunction).  - Aortic valve: Valve area (VTI): 1.86 cm^2. Valve area (Vmax):    2.01 cm^2. Valve area (Vmean): 1.97 cm^2.  - Mitral valve: There was mild regurgitation.  - Technically difficult study. Echocontrast was used to enhance    visualization.   Echo October 2018 tudy Conclusions   - Left ventricle: The cavity size was normal. There was mild    concentric hypertrophy. Systolic function was moderately reduced.    The estimated ejection fraction was in the range of 35%  to 40%.    Diffuse hypokinesis. Doppler parameters are consistent with    abnormal left ventricular relaxation (grade 1 diastolic    dysfunction). Doppler parameters are consistent with high    ventricular filling pressure.  - Aortic valve: Transvalvular velocity was within the normal range.    There was no stenosis. There was mild regurgitation.  - Mitral valve: Transvalvular velocity was within the  normal range.    There was no evidence for stenosis. There was mild regurgitation.  - Right ventricle: The cavity size was normal. Wall thickness was    normal. Systolic function was normal.   Myoview  Lexiscan 2012  IMPRESSION:  Probably negative stress nuclear myocardial study revealing  impaired exercise capacity, and a nondiagnostic stress EKG due to  the presence of baseline ST-segment abnormalities, mild left  ventricular dilatation and mild left ventricular dysfunction in a  segmental pattern.   Despite the apparent wall motion abnormality,  no definite perfusion abnormalities were identified.  An anterior  defect was consistent with breast attenuation, while changes at the  base of the inferior wall may have represented diaphragmatic  attenuation; however, the presence of a small amount of scarring  cannot be excluded.  Other findings as noted.       Physical Exam:   VS:  BP (!) 132/90 (BP Location: Left Arm, Patient Position: Sitting, Cuff Size: Normal)   Pulse 71   Ht 5' 2 (1.575 m)   Wt 229 lb (103.9 kg)   SpO2 94%   BMI 41.88 kg/m    Wt Readings from Last 3 Encounters:  11/24/23 229 lb (103.9 kg)  11/18/23 226 lb 9.6 oz (102.8 kg)  08/20/22 234 lb (106.1 kg)    GEN: Well nourished, well developed in no acute distress NECK: No JVD; No carotid bruits CARDIAC: RRR, + murmur, no rubs, gallops RESPIRATORY:  Clear to auscultation without rales, wheezing or rhonchi  ABDOMEN: Soft, non-tender, non-distended EXTREMITIES:  No edema; No deformity   ASSESSMENT AND PLAN: .    HFrEF Nonischemic cardiomyopathy Repeat echocardiogram in 2023 showed LVEF 50 to 55%, no wall motion abnormalities, moderate LVH, grade 1 diastolic dysfunction, trivial MR.  Patient is euvolemic on exam.  Continue Coreg  25 mg twice daily, Lasix  20 mg daily, Hyzaar 100-12.5 mg daily, spironolactone  25 mg daily.  Will check a c-Met, CBC, lipid panel, A1c, TSH.  Hypertension Blood pressure is good.   Continue Coreg  25 mg daily, Hyzaar 100-12.5 mg daily and spironolactone  25 mg daily.  Hyperlipidemia Last LDL 113 in 2023.  Update lipids today.  Continue Lipitor 20 mg daily  Murmur I will update echo.    Dispo: Follow-up in 6 months  Signed, Laquon Emel VEAR Fishman, PA-C

## 2023-11-25 ENCOUNTER — Telehealth: Payer: Self-pay | Admitting: Medical

## 2023-11-25 ENCOUNTER — Other Ambulatory Visit (HOSPITAL_COMMUNITY): Payer: Self-pay

## 2023-11-25 LAB — HEMOGLOBIN A1C
Est. average glucose Bld gHb Est-mCnc: 103 mg/dL
Hgb A1c MFr Bld: 5.2 % (ref 4.8–5.6)

## 2023-11-25 LAB — COMPREHENSIVE METABOLIC PANEL
ALT: 15 [IU]/L (ref 0–32)
AST: 16 [IU]/L (ref 0–40)
Albumin: 3.7 g/dL — ABNORMAL LOW (ref 3.8–4.9)
Alkaline Phosphatase: 76 [IU]/L (ref 44–121)
BUN/Creatinine Ratio: 14 (ref 12–28)
BUN: 18 mg/dL (ref 8–27)
Bilirubin Total: 0.4 mg/dL (ref 0.0–1.2)
CO2: 27 mmol/L (ref 20–29)
Calcium: 9 mg/dL (ref 8.7–10.3)
Chloride: 103 mmol/L (ref 96–106)
Creatinine, Ser: 1.3 mg/dL — ABNORMAL HIGH (ref 0.57–1.00)
Globulin, Total: 3.8 g/dL (ref 1.5–4.5)
Glucose: 83 mg/dL (ref 70–99)
Potassium: 4.5 mmol/L (ref 3.5–5.2)
Sodium: 141 mmol/L (ref 134–144)
Total Protein: 7.5 g/dL (ref 6.0–8.5)
eGFR: 47 mL/min/{1.73_m2} — ABNORMAL LOW (ref >59)

## 2023-11-25 LAB — LIPID PANEL
Chol/HDL Ratio: 2.8 {ratio} (ref 0.0–4.4)
Cholesterol, Total: 147 mg/dL (ref 100–199)
HDL: 52 mg/dL (ref >39)
LDL Chol Calc (NIH): 82 mg/dL (ref 0–99)
Triglycerides: 67 mg/dL (ref 0–149)
VLDL Cholesterol Cal: 13 mg/dL (ref 5–40)

## 2023-11-25 LAB — CBC
Hematocrit: 37.9 % (ref 34.0–46.6)
Hemoglobin: 12.6 g/dL (ref 11.1–15.9)
MCH: 31.3 pg (ref 26.6–33.0)
MCHC: 33.2 g/dL (ref 31.5–35.7)
MCV: 94 fL (ref 79–97)
Platelets: 328 10*3/uL (ref 150–450)
RBC: 4.03 x10E6/uL (ref 3.77–5.28)
RDW: 12.4 % (ref 11.7–15.4)
WBC: 4.1 10*3/uL (ref 3.4–10.8)

## 2023-11-25 LAB — TSH: TSH: 1.27 u[IU]/mL (ref 0.450–4.500)

## 2023-11-25 LAB — LDL CHOLESTEROL, DIRECT: LDL Direct: 82 mg/dL (ref 0–99)

## 2023-11-25 MED ORDER — LOSARTAN POTASSIUM 100 MG PO TABS
100.0000 mg | ORAL_TABLET | Freq: Every day | ORAL | 3 refills | Status: DC
  Filled 2023-11-25: qty 90, 90d supply, fill #0

## 2023-11-25 NOTE — Telephone Encounter (Signed)
 Pt returning call for results

## 2023-11-25 NOTE — Telephone Encounter (Signed)
 The patient has been notified of the result along with recommendations. Pt verbalized understanding. All questions (if any) were answered   Medication list updated  Hyaar d/c Losartan  100 mg sent to requested pharmacy     Furth, Cadence H, PA-C to Me      11/25/23  8:48 AM Result Note Labs show some dehydration.  Lets stop hydrochlorothiazide (from Hyzaar, the combination pill )and continue the losartan  100 mg daily. Cholesterol overall looks good.

## 2023-11-30 ENCOUNTER — Telehealth: Payer: Self-pay | Admitting: General Practice

## 2023-11-30 NOTE — Telephone Encounter (Signed)
 Spoke with patient about medication issue. Patient requested losartan  be resent to correct pharmacy but that was sent in by cardiology. Advised patient to call Cardiology to have rx resent. Patient verbalized.

## 2023-11-30 NOTE — Telephone Encounter (Signed)
 Copied from CRM [id number]. Topic: Clinical - Prescription Issue >> Nov 30, 2023  1:05 PM Earnestine Goes B wrote: Reason for CRM: pt called to advise her prescription went to the wrong pharmacy.  Pt is requesting her medication be sent to [facility]  [address] [address]

## 2023-12-02 NOTE — Progress Notes (Unsigned)
   Established Patient Office Visit  Subjective   Patient ID: ARYELLA BESECKER, female    DOB: 10-07-1961  Age: 63 y.o. MRN: [id number]  No chief complaint on file.   HPI  {History (Optional):23778}     11/18/2023   12:32 PM 11/18/2023   10:49 AM 08/20/2022   11:26 AM  Depression screen PHQ 2/9  Decreased Interest 0 0 0  Down, Depressed, Hopeless 0 0 0  PHQ - 2 Score 0 0 0  Altered sleeping 1    Tired, decreased energy 0    Change in appetite 1    Feeling bad or failure about yourself  0    Trouble concentrating 0    Moving slowly or fidgety/restless 0    Suicidal thoughts 0    PHQ-9 Score 2    Difficult doing work/chores Not difficult at all         11/18/2023   12:33 PM  GAD 7 : Generalized Anxiety Score  Nervous, Anxious, on Edge 0  Control/stop worrying 0  Worry too much - different things 0  Trouble relaxing 0  Restless 0  Easily annoyed or irritable 0  Afraid - awful might happen 0  Total GAD 7 Score 0  Anxiety Difficulty Not difficult at all      ROS    Objective:     There were no vitals taken for this visit. {Vitals History (Optional):23777}  Physical Exam   No results found for any visits on 12/03/23.  {Labs (Optional):23779}  The 10-year ASCVD risk score (Arnett DK, et al., 2019) is: 5.8%    Assessment & Plan:  There are no diagnoses linked to this encounter.   No follow-ups on file.    [place]esto Charon, NP

## 2023-12-03 ENCOUNTER — Encounter: Payer: Self-pay | Admitting: General Practice

## 2023-12-03 ENCOUNTER — Ambulatory Visit (INDEPENDENT_AMBULATORY_CARE_PROVIDER_SITE_OTHER): Payer: Medicare HMO | Admitting: General Practice

## 2023-12-03 ENCOUNTER — Other Ambulatory Visit (HOSPITAL_COMMUNITY): Payer: Self-pay

## 2023-12-03 ENCOUNTER — Other Ambulatory Visit: Payer: Self-pay

## 2023-12-03 VITALS — BP 128/80 | HR 76 | Temp 97.9°F | Ht 61.0 in | Wt 229.0 lb

## 2023-12-03 DIAGNOSIS — Z1231 Encounter for screening mammogram for malignant neoplasm of breast: Secondary | ICD-10-CM | POA: Diagnosis not present

## 2023-12-03 DIAGNOSIS — I5042 Chronic combined systolic (congestive) and diastolic (congestive) heart failure: Secondary | ICD-10-CM | POA: Diagnosis not present

## 2023-12-03 DIAGNOSIS — R011 Cardiac murmur, unspecified: Secondary | ICD-10-CM

## 2023-12-03 DIAGNOSIS — I10 Essential (primary) hypertension: Secondary | ICD-10-CM | POA: Insufficient documentation

## 2023-12-03 DIAGNOSIS — M5489 Other dorsalgia: Secondary | ICD-10-CM

## 2023-12-03 DIAGNOSIS — Z0000 Encounter for general adult medical examination without abnormal findings: Secondary | ICD-10-CM | POA: Insufficient documentation

## 2023-12-03 DIAGNOSIS — E785 Hyperlipidemia, unspecified: Secondary | ICD-10-CM

## 2023-12-03 DIAGNOSIS — I110 Hypertensive heart disease with heart failure: Secondary | ICD-10-CM

## 2023-12-03 DIAGNOSIS — Z1211 Encounter for screening for malignant neoplasm of colon: Secondary | ICD-10-CM

## 2023-12-03 MED ORDER — CVS MANUAL BLOOD PRESSURE KIT: 1.0000 [IU] | PACK | Freq: Every day | 0 refills | Status: AC

## 2023-12-03 MED ORDER — LOSARTAN POTASSIUM 100 MG PO TABS
100.0000 mg | ORAL_TABLET | Freq: Every day | ORAL | 3 refills | Status: DC
Start: 2023-12-03 — End: 2024-07-22

## 2023-12-03 NOTE — Assessment & Plan Note (Signed)
Controlled.   Continue Losartan 100 mg once daily. Continue coreg twice daily, spironolactone 25 once daily and lasix once daily.   Discussed importance of low salt intake and start exercise.   Followed by cardiology.

## 2023-12-03 NOTE — Assessment & Plan Note (Signed)
Discussed the importance of monitoring diet and start exercising.  Declines referral for healthy weight and wellness.

## 2023-12-03 NOTE — Assessment & Plan Note (Signed)
Immunizations Tetanus due, Shingles due, Influenza UTD.  Pap smear UTD. Mammogram due, orders placed. Colonoscopy due  Medicare wellness exam due  Discussed the importance of a healthy diet and regular exercise in order for weight loss, and to reduce the risk of further co-morbidity.  Exam stable. Labs pending.  Follow up in 1 year for repeat physical.

## 2023-12-03 NOTE — Assessment & Plan Note (Signed)
Followed by Cardiology.  Continue medications as prescribed.

## 2023-12-03 NOTE — Assessment & Plan Note (Signed)
Slightly improved.  Continue flexeril as needed.  Recommend tylenol 500 mg and ibuprofen 600 mg together for pain every 6-8 hours.  Heat to site, lidocaine patches as needed.  Exercises to area as tolerated. Declines PT at this time.

## 2023-12-03 NOTE — Assessment & Plan Note (Signed)
Controlled per labs on 11/24/23 with cardiology. Continue Lipitor.

## 2023-12-03 NOTE — Patient Instructions (Addendum)
Recommend tylenol 500 mg and ibuprofen 600 mg together for pain every 6-8 hours.  Heat to site, lidocaine patches as needed.  Exercises to area as tolerated.  Continue BP log at home. Continue Losartan 100 mg, Coreg, Lasix, Spirolactone.   Schedule TDAP and Shingles vaccine at the pharmacy.   You will either be contacted via phone regarding your referral to gastroenterology for colonoscopy , or you may receive a letter on your MyChart portal from our referral team with instructions for scheduling an appointment. Please let us know if you have not been contacted by anyone within two weeks.  Call [facility] and find out which date you can have your mammogram and then call the mammogram center listed below and schedule your mammogram.   You have an order for:  []   2D Mammogram  [x]   3D Mammogram  []   Bone Density     Please call for appointment:  [facility]  [address] [place] [address] [phone] [facility] [address] [place], [place] [address] [phone] [facility] [address] Geanie Logan [place], [place] [address] [phone]   Make sure to wear two-piece clothing.  No lotions, powders, or deodorants the day of the appointment. Make sure to bring picture ID and insurance card.  Bring list of medications you are currently taking including any supplements.   Schedule your [place] screening mammogram through MyChart!   Log into your MyChart account.  Go to 'Visit' (or 'Appointments' if on mobile App) --> Schedule an Appointment  Under 'Select a Reason for Visit' choose the Mammogram Screening option.  Complete the pre-visit questions and select the time and place that best fits your schedule.

## 2023-12-03 NOTE — Assessment & Plan Note (Signed)
Not heard on exam today.  Followed by cardiology.

## 2023-12-10 ENCOUNTER — Ambulatory Visit: Payer: Medicare HMO | Attending: Medical

## 2023-12-14 ENCOUNTER — Other Ambulatory Visit: Payer: Self-pay

## 2023-12-14 ENCOUNTER — Telehealth: Payer: Self-pay

## 2023-12-14 ENCOUNTER — Telehealth: Payer: Self-pay | Admitting: Cardiology

## 2023-12-14 DIAGNOSIS — Z860100 Personal history of colon polyps, unspecified: Secondary | ICD-10-CM

## 2023-12-14 MED ORDER — PEG 3350-KCL-NA BICARB-NACL 420 G PO SOLR: 4000.0000 mL | Freq: Once | ORAL | 0 refills | Status: AC

## 2023-12-14 NOTE — Telephone Encounter (Signed)
 Gastroenterology Pre-Procedure Review  Request Date: 01/06/24 Requesting Physician: Dr. Allegra Lai  PATIENT REVIEW QUESTIONS: The patient responded to the following health history questions as indicated:    1. Are you having any GI issues? Spicy foods cause some trouble with stomach 2. Do you have a personal history of Polyps? yes (Last colonoscopy 03/19/2018 with Dr. Darrick Penna at [facility] recommended repeat in 3 years) 3. Do you have a family history of Colon Cancer or Polyps? no 4. Diabetes Mellitus? no 5. Joint replacements in the past 12 months?no 6. Major health problems in the past 3 months?no 7. Any artificial heart valves, MVP, or defibrillator?no 8. Cardiac history? yes (heart murmur CHF Cardiac Clearance sent to Preop)    MEDICATIONS & ALLERGIES:    Patient reports the following regarding taking any anticoagulation/antiplatelet therapy:   Plavix, Coumadin, Eliquis, Xarelto, Lovenox, Pradaxa, Brilinta, or Effient? no Aspirin? no  Patient confirms/reports the following medications:  Current Outpatient Medications  Medication Sig Dispense Refill   acetaminophen (TYLENOL) 500 MG tablet Take one tablet by mouth two times daily, as needed, for arthritis pain 30 tablet 0   albuterol (VENTOLIN HFA) 108 (90 Base) MCG/ACT inhaler Inhale into the lungs every 6 (six) hours as needed for wheezing or shortness of breath.     atorvastatin (LIPITOR) 20 MG tablet Take 1 tablet (20 mg total) by mouth daily. 30 tablet 5   azelastine (ASTELIN) 0.1 % nasal spray Place 2 sprays into both nostrils 2 (two) times daily. Use in each nostril as directed 30 mL 12   Blood Pressure Monitoring ([employer] MANUAL BLOOD PRESSURE) KIT 1 Units by Does not apply route daily. 3 kit 0   carvedilol (COREG) 25 MG tablet Take 1 tablet (25 mg total) by mouth 2 (two) times daily with a meal. 180 tablet 1   cyclobenzaprine (FLEXERIL) 5 MG tablet Take 1 tablet (5 mg total) by mouth 3 (three) times daily as needed for muscle spasms.  30 tablet 0   fluticasone (FLONASE) 50 MCG/ACT nasal spray Place 2 sprays into both nostrils daily. 16 g 6   furosemide (LASIX) 20 MG tablet Take 1 tablet (20 mg total) by mouth daily. 90 tablet 1   losartan (COZAAR) 100 MG tablet Take 1 tablet (100 mg total) by mouth daily. 90 tablet 3   spironolactone (ALDACTONE) 25 MG tablet Take one tablet by mouth once daily 90 tablet 3   Tiotropium Bromide Monohydrate (SPIRIVA RESPIMAT) 2.5 MCG/ACT AERS Inhale into the lungs as needed.     No current facility-administered medications for this visit.    Patient confirms/reports the following allergies:  No Known Allergies  No orders of the defined types were placed in this encounter.   AUTHORIZATION INFORMATION Primary Insurance: 1D#: Group #:  Secondary Insurance: 1D#: Group #:  SCHEDULE INFORMATION: Date: 01/06/24 Time: Location: [facility]

## 2023-12-14 NOTE — Telephone Encounter (Signed)
 The patient called in request to speak to [facility] to schedule her colonoscopy.

## 2023-12-14 NOTE — Telephone Encounter (Signed)
   Patient Name: TAELYR JANTZ  DOB: 1961/01/28 MRN: [id number]  Primary Cardiologist: Nona Dell, MD  Chart reviewed as part of pre-operative protocol coverage. Given past medical history and time since last visit, based on ACC/AHA guidelines, Mackenzee Becvar Schiraldi is at acceptable risk for the planned procedure without further cardiovascular testing.   The patient was advised that if she develops new symptoms prior to surgery to contact our office to arrange for a follow-up visit, and she verbalized understanding.  I will route this recommendation to the requesting party via Epic fax function and remove from pre-op pool.  Please call with questions.  Napoleon Form, Leodis Rains, NP 12/14/2023, 1:22 PM

## 2023-12-14 NOTE — Telephone Encounter (Signed)
   Pre-operative Risk Assessment    Patient Name: Michelle David  DOB: 1961/01/22 MRN: [id number]   Date of last office visit: 11/24/2023 Date of next office visit: none   Request for Surgical Clearance    Procedure:   colonoscopy  Date of Surgery:  Clearance 01/06/24                                Surgeon:  Dr. Lannette Donath Surgeon's Group or Practice Name:  [facility] Phone number:  [phone] Fax number:  [phone]   Type of Clearance Requested:   - Medical    Type of Anesthesia:  General    Additional requests/questions:    SignedRoyann Shivers   12/14/2023, 11:49 AM

## 2023-12-14 NOTE — Telephone Encounter (Signed)
 Good Morning Cadence,  We have received a surgical clearance request for Michelle David. They were seen recently in clinic on 2//25. Can you please comment on surgical clearance and if she would be at acceptable risk for upcoming colonoscopy.  Please forward you guidance and recommendations to [facility]OP   Thanks, Alden Server

## 2023-12-15 ENCOUNTER — Other Ambulatory Visit: Payer: Self-pay | Admitting: General Practice

## 2023-12-15 DIAGNOSIS — I110 Hypertensive heart disease with heart failure: Secondary | ICD-10-CM

## 2023-12-21 ENCOUNTER — Telehealth: Payer: Self-pay

## 2023-12-21 NOTE — Telephone Encounter (Signed)
 The patient called in to get the date for her procedure and the time. I gave her the date. I told her that she will have to call ENDO the day before to get the time.

## 2023-12-22 NOTE — Telephone Encounter (Signed)
 Called patient back to see if she had any additional questions or concerns regarding her colonoscopy.  Thanks,  [place], [place]

## 2023-12-22 NOTE — Telephone Encounter (Signed)
  All Conversations: clearance 01/06/2024 (Newest Message First)December 14, 2023 Gaston Islam., NP     12/14/23  1:22 PM Note     Patient Name: Michelle David  DOB: 1960/11/26 MRN: [id number]   Primary Cardiologist: Nona Dell, MD   Chart reviewed as part of pre-operative protocol coverage. Given past medical history and time since last visit, based on ACC/AHA guidelines, Landa Mullinax Fuhriman is at acceptable risk for the planned procedure without further cardiovascular testing.    The patient was advised that if she develops new symptoms prior to surgery to contact our office to arrange for a follow-up visit, and she verbalized understanding.   I will route this recommendation to the requesting party via Epic fax function and remove from pre-op pool.   Please call with questions.   Napoleon Form, Leodis Rains, NP 12/14/2023, 1:22 PM

## 2023-12-24 ENCOUNTER — Ambulatory Visit
Admission: RE | Admit: 2023-12-24 | Discharge: 2023-12-24 | Disposition: A | Discharge status: Home / Self Care | Payer: Medicare HMO | Source: Ambulatory Visit | Attending: General Practice | Admitting: General Practice

## 2023-12-24 DIAGNOSIS — Z1231 Encounter for screening mammogram for malignant neoplasm of breast: Secondary | ICD-10-CM | POA: Diagnosis not present

## 2023-12-30 ENCOUNTER — Encounter: Payer: Self-pay | Admitting: Gastroenterology

## 2023-12-30 ENCOUNTER — Encounter: Payer: Self-pay | Admitting: General Practice

## 2024-01-05 ENCOUNTER — Telehealth: Payer: Self-pay

## 2024-01-05 ENCOUNTER — Ambulatory Visit: Payer: Medicare HMO | Attending: Medical

## 2024-01-05 ENCOUNTER — Encounter: Payer: Self-pay | Admitting: Gastroenterology

## 2024-01-05 NOTE — Telephone Encounter (Signed)
 Pt requesting call back to schedule colonoscopy.

## 2024-01-05 NOTE — Telephone Encounter (Signed)
 Patient requested to get the arrival time for her colonoscopy earlier than 1pm today due to still lining up transportation.  Vikki in Endo said for her to arrive at 10:15am tomorrow.  Pt notified of this.  We reviewed prep instructions again.  She verbalized understanding.  Asked her to call me back if she has any questions.  Thanks, [place], [place]

## 2024-01-06 ENCOUNTER — Ambulatory Visit
Admission: RE | Admit: 2024-01-06 | Discharge: 2024-01-06 | Disposition: A | Discharge status: Home / Self Care | Payer: Medicare HMO | Attending: Gastroenterology | Admitting: Gastroenterology

## 2024-01-06 ENCOUNTER — Encounter
Admission: RE | Disposition: A | Discharge status: Home / Self Care | Payer: Self-pay | Source: Home / Self Care | Attending: Gastroenterology

## 2024-01-06 ENCOUNTER — Ambulatory Visit: Admitting: Anesthesiology

## 2024-01-06 ENCOUNTER — Encounter: Payer: Self-pay | Admitting: Gastroenterology

## 2024-01-06 DIAGNOSIS — Z79899 Other long term (current) drug therapy: Secondary | ICD-10-CM | POA: Diagnosis not present

## 2024-01-06 DIAGNOSIS — I110 Hypertensive heart disease with heart failure: Secondary | ICD-10-CM | POA: Insufficient documentation

## 2024-01-06 DIAGNOSIS — F419 Anxiety disorder, unspecified: Secondary | ICD-10-CM | POA: Insufficient documentation

## 2024-01-06 DIAGNOSIS — Z860100 Personal history of colon polyps, unspecified: Secondary | ICD-10-CM | POA: Diagnosis not present

## 2024-01-06 DIAGNOSIS — Z6841 Body Mass Index (BMI) 40.0 and over, adult: Secondary | ICD-10-CM | POA: Diagnosis not present

## 2024-01-06 DIAGNOSIS — I429 Cardiomyopathy, unspecified: Secondary | ICD-10-CM | POA: Diagnosis not present

## 2024-01-06 DIAGNOSIS — Z860101 Personal history of adenomatous and serrated colon polyps: Secondary | ICD-10-CM | POA: Diagnosis not present

## 2024-01-06 DIAGNOSIS — I509 Heart failure, unspecified: Secondary | ICD-10-CM | POA: Insufficient documentation

## 2024-01-06 DIAGNOSIS — Z1211 Encounter for screening for malignant neoplasm of colon: Secondary | ICD-10-CM

## 2024-01-06 DIAGNOSIS — I5042 Chronic combined systolic (congestive) and diastolic (congestive) heart failure: Secondary | ICD-10-CM | POA: Diagnosis not present

## 2024-01-06 DIAGNOSIS — G4730 Sleep apnea, unspecified: Secondary | ICD-10-CM | POA: Diagnosis not present

## 2024-01-06 DIAGNOSIS — E66813 Obesity, class 3: Secondary | ICD-10-CM | POA: Diagnosis not present

## 2024-01-06 DIAGNOSIS — Z09 Encounter for follow-up examination after completed treatment for conditions other than malignant neoplasm: Secondary | ICD-10-CM | POA: Diagnosis not present

## 2024-01-06 SURGERY — COLONOSCOPY WITH PROPOFOL
Anesthesia: General

## 2024-01-06 MED ORDER — LIDOCAINE HCL (CARDIAC) PF 100 MG/5ML IV SOSY
PREFILLED_SYRINGE | INTRAVENOUS | Status: DC | PRN
Start: 2024-01-06 — End: 2024-01-06
  Administered 2024-01-06: 60 mg via INTRAVENOUS

## 2024-01-06 MED ORDER — PROPOFOL 10 MG/ML IV BOLUS
INTRAVENOUS | Status: DC | PRN
  Administered 2024-01-06: 100 ug/kg/min via INTRAVENOUS
  Administered 2024-01-06: 50 mg via INTRAVENOUS

## 2024-01-06 MED ORDER — SODIUM CHLORIDE 0.9 % IV SOLN
INTRAVENOUS | Status: DC
Start: 2024-01-06 — End: 2024-01-06

## 2024-01-06 MED ORDER — LIDOCAINE HCL (PF) 2 % IJ SOLN
INTRAMUSCULAR | Status: AC
  Filled 2024-01-06: qty 5

## 2024-01-06 NOTE — Anesthesia Procedure Notes (Signed)
 Procedure Name: General with mask airway Date/Time: 01/06/2024 11:27 AM  Performed by: Lily Lovings, CRNAPre-anesthesia Checklist: Patient identified, Emergency Drugs available, Suction available and Patient being monitored Patient Re-evaluated:Patient Re-evaluated prior to induction Oxygen Delivery Method: Simple face mask Preoxygenation: Pre-oxygenation with 100% oxygen Induction Type: IV induction Comments: POM

## 2024-01-06 NOTE — Anesthesia Preprocedure Evaluation (Signed)
 Anesthesia Evaluation  Patient identified by MRN, date of birth, ID band Patient awake    Reviewed: Allergy & Precautions, NPO status , Patient's Chart, lab work & pertinent test results  History of Anesthesia Complications Negative for: history of anesthetic complications  Airway Mallampati: II  TM Distance: >3 FB Neck ROM: full    Dental  (+) Upper Dentures, Lower Dentures   Pulmonary neg shortness of breath, sleep apnea    Pulmonary exam normal        Cardiovascular Exercise Tolerance: Good hypertension, (-) angina +CHF  Normal cardiovascular exam     Neuro/Psych   Anxiety     negative neurological ROS     GI/Hepatic negative GI ROS, Neg liver ROS,neg GERD  ,,  Endo/Other    Class 3 obesity  Renal/GU negative Renal ROS  negative genitourinary   Musculoskeletal   Abdominal   Peds  Hematology negative hematology ROS (+)   Anesthesia Other Findings Past Medical History: 02/05/2022: Abnormal EKG 06/13/2017: Allergic rhinitis 07/08/2021: Alopecia No date: Blood in stool No date: Cardiomyopathy [facility])     Comment:  Diagnosed March 2018 No date: CHF (congestive heart failure) (HCC)     Comment:  a. EF 30-35% by echo in 12/2016 b. EF improved to 45-50%              in 07/2018 c. 10/2020: EF improved to 50-55%. 06/10/2020: Chronic hand pain, left No date: Dyspepsia No date: Essential hypertension No date: Heart disease No date: Heart murmur No date: Hyperlipidemia 11/01/2018: Hypoxia No date: Impaired glucose tolerance 07/08/2021: Insomnia due to anxiety and fear 11/01/2018: Multifocal pneumonia No date: Obesity 01/22/2018: Osteoarthritis of both knees 08/20/2017: Poor dentition 12/17/2016: Sleep apnea 01/22/2018: Snoring 03/01/2015: Solitary thyroid nodule     Comment:  Dominant left thyroid nodule 3.2 cm noted in 02/2015  03/17/2022: Vitamin D deficiency  Past Surgical History: No date: ABDOMINAL  HYSTERECTOMY 03/19/2018: COLONOSCOPY; N/A     Comment:  Procedure: COLONOSCOPY;  Surgeon: West Bali, MD;                Location[facility];  Service: Endoscopy;                Laterality: N/A;  2:00 No date: OOPHORECTOMY No date: TUBAL LIGATION  BMI    Body Mass Index: 42.14 kg/m      Reproductive/Obstetrics negative OB ROS                             Anesthesia Physical Anesthesia Plan  ASA: 3  Anesthesia Plan: General   Post-op Pain Management:    Induction: Intravenous  PONV Risk Score and Plan: Propofol infusion and TIVA  Airway Management Planned: Natural Airway and Nasal Cannula  Additional Equipment:   Intra-op Plan:   Post-operative Plan:   Informed Consent: I have reviewed the patients History and Physical, chart, labs and discussed the procedure including the risks, benefits and alternatives for the proposed anesthesia with the patient or authorized representative who has indicated his/her understanding and acceptance.     Dental Advisory Given  Plan Discussed with: Anesthesiologist, CRNA and Surgeon  Anesthesia Plan Comments: (Patient consented for risks of anesthesia including but not limited to:  - adverse reactions to medications - risk of airway placement if required - damage to eyes, teeth, lips or other oral mucosa - nerve damage due to positioning  - sore throat or hoarseness - Damage to  heart, brain, nerves, lungs, other parts of body or loss of life  Patient voiced understanding and assent.)       Anesthesia Quick Evaluation

## 2024-01-06 NOTE — H&P (Signed)
 Arlyss Repress, MD [address]  [address]  [place], [place] [address]  Main: [phone]  Fax: [phone] Pager: [phone]  Primary Care Physician:  Modesto Charon, NP Primary Gastroenterologist:  Dr. Arlyss Repress  Pre-Procedure History & Physical: HPI:  Michelle David is a 64 y.o. female is here for an colonoscopy.   Past Medical History:  Diagnosis Date   Abnormal EKG 02/05/2022   Allergic rhinitis 06/13/2017   Alopecia 07/08/2021   Blood in stool    Cardiomyopathy [facility])    Diagnosed March 2018   CHF (congestive heart failure) (HCC)    a. EF 30-35% by echo in 12/2016 b. EF improved to 45-50% in 07/2018 c. 10/2020: EF improved to 50-55%.   Chronic hand pain, left 06/10/2020   Dyspepsia    Essential hypertension    Heart disease    Heart murmur    Hyperlipidemia    Hypoxia 11/01/2018   Impaired glucose tolerance    Insomnia due to anxiety and fear 07/08/2021   Multifocal pneumonia 11/01/2018   Obesity    Osteoarthritis of both knees 01/22/2018   Poor dentition 08/20/2017   Sleep apnea 12/17/2016   Snoring 01/22/2018   Solitary thyroid nodule 03/01/2015   Dominant left thyroid nodule 3.2 cm noted in 02/2015    Vitamin D deficiency 03/17/2022    Past Surgical History:  Procedure Laterality Date   ABDOMINAL HYSTERECTOMY     COLONOSCOPY N/A 03/19/2018   Procedure: COLONOSCOPY;  Surgeon: West Bali, MD;  Location: [facility];  Service: Endoscopy;  Laterality: N/A;  2:00   OOPHORECTOMY     TUBAL LIGATION      Prior to Admission medications   Medication Sig Start Date End Date Taking? Authorizing Provider  carvedilol (COREG) 25 MG tablet Take 1 tablet (25 mg total) by mouth 2 (two) times daily with a meal. 06/19/22  Yes Kerri Perches, MD  losartan (COZAAR) 100 MG tablet Take 1 tablet (100 mg total) by mouth daily. 12/03/23 03/02/24 Yes Modesto Charon, NP  acetaminophen (TYLENOL) 500 MG tablet Take one tablet by mouth two times daily, as  needed, for arthritis pain 11/14/19   Kerri Perches, MD  albuterol (VENTOLIN HFA) 108 (90 Base) MCG/ACT inhaler Inhale into the lungs every 6 (six) hours as needed for wheezing or shortness of breath.    [provider]  atorvastatin (LIPITOR) 20 MG tablet Take 1 tablet (20 mg total) by mouth daily. 03/18/22   Kerri Perches, MD  azelastine (ASTELIN) 0.1 % nasal spray Place 2 sprays into both nostrils 2 (two) times daily. Use in each nostril as directed 08/20/22   Kerri Perches, MD  Blood Pressure Monitoring ([employer]S MANUAL BLOOD PRESSURE) KIT 1 Units by Does not apply route daily. 12/03/23   Modesto Charon, NP  cyclobenzaprine (FLEXERIL) 5 MG tablet Take 1 tablet (5 mg total) by mouth 3 (three) times daily as needed for muscle spasms. 11/18/23   Modesto Charon, NP  fluticasone (FLONASE) 50 MCG/ACT nasal spray Place 2 sprays into both nostrils daily. 04/15/21   Kerri Perches, MD  furosemide (LASIX) 20 MG tablet Take 1 tablet (20 mg total) by mouth daily. 06/19/22   Kerri Perches, MD  spironolactone (ALDACTONE) 25 MG tablet Take one tablet by mouth once daily 02/05/22   Kerri Perches, MD  Tiotropium Bromide Monohydrate (SPIRIVA RESPIMAT) 2.5 MCG/ACT AERS Inhale into the lungs as needed.    [provider]  Allergies as of 12/14/2023   (No Known Allergies)    Family History  Problem Relation Age of Onset   Heart disease Mother        Died in her 68s with MI   Hypertension Mother    Depression Mother    Early death Mother    Heart attack Mother    High Cholesterol Daughter    High blood pressure Daughter    Lung cancer Maternal Aunt    Breast cancer Cousin        both sides   Alcohol abuse Brother    Depression Son    High blood pressure Son    High Cholesterol Son     Social History   Socioeconomic History   Marital status: Married    Spouse name: Molly Maduro   Number of children: 3   Years of education: 12   Highest education level: 12th  grade  Occupational History   Occupation: Office manager: [employer]  Tobacco Use   Smoking status: Never    Passive exposure: Never   Smokeless tobacco: Never  Vaping Use   Vaping status: Never Used  Substance and Sexual Activity   Alcohol use: Not Currently    Comment: special occasions   Drug use: No   Sexual activity: Not Currently    Partners: Male  Other Topics Concern   Not on file  Social History Narrative   Not on file   Social Drivers of Health   Financial Resource Strain: Not on file  Food Insecurity: Not on file  Transportation Needs: Not on file  Physical Activity: Not on file  Stress: Not on file  Social Connections: Not on file  Intimate Partner Violence: Not on file    Review of Systems: See HPI, otherwise negative ROS  Physical Exam: BP (!) 166/86   Pulse 76   Temp (!) 97 F (36.1 C) (Temporal)   Resp 18   Ht 5\' 1"  (1.549 m)   Wt 101.2 kg   SpO2 98%   BMI 42.14 kg/m  General:   Alert,  pleasant and cooperative in NAD Head:  Normocephalic and atraumatic. Neck:  Supple; no masses or thyromegaly. Lungs:  Clear throughout to auscultation.    Heart:  Regular rate and rhythm. Abdomen:  Soft, nontender and nondistended. Normal bowel sounds, without guarding, and without rebound.   Neurologic:  Alert and  oriented x4;  grossly normal neurologically.  Impression/Plan: Michelle David is here for a colonoscopy to be performed for h/o colon adenomas  Risks, benefits, limitations, and alternatives regarding  colonoscopy have been reviewed with the patient.  Questions have been answered.  All parties agreeable.   Lannette Donath, MD  01/06/2024, 10:51 AM

## 2024-01-06 NOTE — Anesthesia Postprocedure Evaluation (Signed)
 Anesthesia Post Note  Patient: Michelle David  Procedure(s) Performed: COLONOSCOPY WITH PROPOFOL  Patient location during evaluation: Endoscopy Anesthesia Type: General Level of consciousness: awake and alert Pain management: pain level controlled Vital Signs Assessment: post-procedure vital signs reviewed and stable Respiratory status: spontaneous breathing, nonlabored ventilation, respiratory function stable and patient connected to nasal cannula oxygen Cardiovascular status: blood pressure returned to baseline and stable Postop Assessment: no apparent nausea or vomiting Anesthetic complications: no   No notable events documented.   Last Vitals:  Vitals:   01/06/24 1159 01/06/24 1209  BP: 108/64 (!) 145/74  Pulse:    Resp:    Temp:    SpO2: 100% 100%    Last Pain:  Vitals:   01/06/24 1209  TempSrc:   PainSc: 0-No pain                 Cleda Mccreedy Sameerah Nachtigal

## 2024-01-06 NOTE — Op Note (Signed)
 [facility] Gastroenterology Patient Name: Michelle David Procedure Date: 01/06/2024 10:37 AM MRN: [id number] Account #: [id number] Date of Birth: 05-04-1961 Admit Type: Outpatient Age: 63 Room: [facility] ENDO ROOM 4 Gender: Female Note Status: Finalized Instrument Name: Prentice Docker [id number] Procedure:             Colonoscopy Indications:           Surveillance: Personal history of adenomatous polyps                         on last colonoscopy > 5 years ago, Last colonoscopy:                         May 2019 Providers:             Toney Reil MD, MD Referring MD:          Toney Reil MD, MD (Referring MD) Medicines:             General Anesthesia Complications:         No immediate complications. Estimated blood loss: None. Procedure:             Pre-Anesthesia Assessment:                        - Prior to the procedure, a History and Physical was                         performed, and patient medications and allergies were                         reviewed. The patient is competent. The risks and                         benefits of the procedure and the sedation options and                         risks were discussed with the patient. All questions                         were answered and informed consent was obtained.                         Patient identification and proposed procedure were                         verified by the physician, the nurse, the                         anesthesiologist, the anesthetist and the technician                         in the pre-procedure area in the procedure room in the                         endoscopy suite. Mental Status Examination: alert and                         oriented. Airway Examination: normal oropharyngeal  airway and neck mobility. Respiratory Examination:                         clear to auscultation. CV Examination: normal.                         Prophylactic Antibiotics:  The patient does not require                         prophylactic antibiotics. Prior Anticoagulants: The                         patient has taken no anticoagulant or antiplatelet                         agents. ASA Grade Assessment: III - A patient with                         severe systemic disease. After reviewing the risks and                         benefits, the patient was deemed in satisfactory                         condition to undergo the procedure. The anesthesia                         plan was to use general anesthesia. Immediately prior                         to administration of medications, the patient was                         re-assessed for adequacy to receive sedatives. The                         heart rate, respiratory rate, oxygen saturations,                         blood pressure, adequacy of pulmonary ventilation, and                         response to care were monitored throughout the                         procedure. The physical status of the patient was                         re-assessed after the procedure.                        After obtaining informed consent, the colonoscope was                         passed under direct vision. Throughout the procedure,                         the patient's blood pressure, pulse, and oxygen  saturations were monitored continuously. The                         Colonoscope was introduced through the anus and                         advanced to the the cecum, identified by appendiceal                         orifice and ileocecal valve. The colonoscopy was                         performed without difficulty. The patient tolerated                         the procedure well. The quality of the bowel                         preparation was evaluated using the BBPS [facility] Bowel                         Preparation Scale) with scores of: Right Colon = 3,                         Transverse Colon  = 3 and Left Colon = 3 (entire mucosa                         seen well with no residual staining, small fragments                         of stool or opaque liquid). The total BBPS score                         equals 9. The ileocecal valve, appendiceal orifice,                         and rectum were photographed. Findings:      The perianal and digital rectal examinations were normal. Pertinent       negatives include normal sphincter tone and no palpable rectal lesions.      The entire examined colon appeared normal.      The retroflexed view of the distal rectum and anal verge was normal and       showed no anal or rectal abnormalities. Impression:            - The entire examined colon is normal.                        - The distal rectum and anal verge are normal on                         retroflexion view.                        - No specimens collected. Recommendation:        - Discharge patient to home (with escort).                        -  Resume previous diet today.                        - Continue present medications.                        - Repeat colonoscopy in 10 years for screening                         purposes. Procedure Code(s):     --- Professional ---                        A5409, Colorectal cancer screening; colonoscopy on                         individual at high risk Diagnosis Code(s):     --- Professional ---                        Z86.010, Personal history of colonic polyps CPT copyright 2022 American Medical Association. All rights reserved. The codes documented in this report are preliminary and upon coder review may  be revised to meet current compliance requirements. Dr. Libby Maw Toney Reil MD, MD 01/06/2024 11:46:34 AM This report has been signed electronically. Number of Addenda: 0 Note Initiated On: 01/06/2024 10:37 AM Scope Withdrawal Time: 0 hours 9 minutes 6 seconds  Total Procedure Duration: 0 hours 11 minutes 35 seconds   Estimated Blood Loss:  Estimated blood loss: none.      [facility]

## 2024-01-06 NOTE — Transfer of Care (Signed)
 Immediate Anesthesia Transfer of Care Note  Patient: Michelle David  Procedure(s) Performed: COLONOSCOPY WITH PROPOFOL  Patient Location: Endoscopy Unit  Anesthesia Type:General  Level of Consciousness: drowsy and patient cooperative  Airway & Oxygen Therapy: Patient Spontanous Breathing  Post-op Assessment: Report given to RN and Post -op Vital signs reviewed and stable  Post vital signs: Reviewed and stable  Last Vitals:  Vitals Value Taken Time  BP 102/57 01/06/24 1149  Temp    Pulse 65 01/06/24 1149  Resp 26 01/06/24 1149  SpO2 98 % 01/06/24 1149  Vitals shown include unfiled device data.  Last Pain:  Vitals:   01/06/24 1029  TempSrc: Temporal  PainSc: 0-No pain         Complications: No notable events documented.

## 2024-02-02 ENCOUNTER — Ambulatory Visit

## 2024-03-02 ENCOUNTER — Ambulatory Visit

## 2024-03-03 ENCOUNTER — Ambulatory Visit

## 2024-03-09 ENCOUNTER — Ambulatory Visit: Attending: Medical

## 2024-05-02 ENCOUNTER — Telehealth: Payer: Self-pay

## 2024-05-02 NOTE — Telephone Encounter (Signed)
 Called patient to see if she would like to come to the office for the immunization clinic we are having for medicare patients on 05/17/24 to get her tetanus or shingles vaccine done. Patient did not answer and VM was left to call the office back.

## 2024-06-02 ENCOUNTER — Ambulatory Visit: Payer: Medicare HMO | Admitting: General Practice

## 2024-06-15 ENCOUNTER — Ambulatory Visit (INDEPENDENT_AMBULATORY_CARE_PROVIDER_SITE_OTHER)
Admission: RE | Admit: 2024-06-15 | Discharge: 2024-06-15 | Disposition: A | Discharge status: Home / Self Care | Source: Ambulatory Visit | Attending: General Practice | Admitting: General Practice

## 2024-06-15 ENCOUNTER — Ambulatory Visit (INDEPENDENT_AMBULATORY_CARE_PROVIDER_SITE_OTHER): Admitting: General Practice

## 2024-06-15 ENCOUNTER — Encounter: Payer: Self-pay | Admitting: General Practice

## 2024-06-15 VITALS — BP 130/82 | HR 73 | Temp 98.2°F | Ht 61.0 in | Wt 230.0 lb

## 2024-06-15 DIAGNOSIS — R7303 Prediabetes: Secondary | ICD-10-CM | POA: Diagnosis not present

## 2024-06-15 DIAGNOSIS — Z6841 Body Mass Index (BMI) 40.0 and over, adult: Secondary | ICD-10-CM | POA: Diagnosis not present

## 2024-06-15 DIAGNOSIS — M25561 Pain in right knee: Secondary | ICD-10-CM

## 2024-06-15 DIAGNOSIS — E785 Hyperlipidemia, unspecified: Secondary | ICD-10-CM | POA: Diagnosis not present

## 2024-06-15 DIAGNOSIS — M170 Bilateral primary osteoarthritis of knee: Secondary | ICD-10-CM | POA: Diagnosis not present

## 2024-06-15 DIAGNOSIS — I110 Hypertensive heart disease with heart failure: Secondary | ICD-10-CM

## 2024-06-15 DIAGNOSIS — M5489 Other dorsalgia: Secondary | ICD-10-CM

## 2024-06-15 DIAGNOSIS — M25461 Effusion, right knee: Secondary | ICD-10-CM | POA: Diagnosis not present

## 2024-06-15 LAB — COMPREHENSIVE METABOLIC PANEL WITH GFR
ALT: 13 U/L (ref 0–35)
AST: 13 U/L (ref 0–37)
Albumin: 3.7 g/dL (ref 3.5–5.2)
Alkaline Phosphatase: 57 U/L (ref 39–117)
BUN: 21 mg/dL (ref 6–23)
CO2: 33 meq/L — ABNORMAL HIGH (ref 19–32)
Calcium: 8.9 mg/dL (ref 8.4–10.5)
Chloride: 102 meq/L (ref 96–112)
Creatinine, Ser: 1.18 mg/dL (ref 0.40–1.20)
GFR: 50.12 mL/min — ABNORMAL LOW (ref >60.00)
Glucose, Bld: 75 mg/dL (ref 70–99)
Potassium: 3.7 meq/L (ref 3.5–5.1)
Sodium: 144 meq/L (ref 135–145)
Total Bilirubin: 0.4 mg/dL (ref 0.2–1.2)
Total Protein: 7.8 g/dL (ref 6.0–8.3)

## 2024-06-15 LAB — HEMOGLOBIN A1C: Hgb A1c MFr Bld: 5.7 % (ref 4.6–6.5)

## 2024-06-15 MED ORDER — LIDOCAINE 4 % EX PTCH
1.0000 | MEDICATED_PATCH | Freq: Every day | CUTANEOUS | 2 refills | Status: AC | PRN
Start: 2024-06-15 — End: ?

## 2024-06-15 NOTE — Patient Instructions (Addendum)
 Start monitoring your blood pressure daily, around the same time of day, for the next 2 weeks.  Ensure that you have rested for 30 minutes prior to checking your blood pressure.   Record your readings and notify me if you see numbers consistently at or above 130 on top and/or 90 on bottom.  Recommend tylenol  500 mg and ibuprofen  600 mg together for pain every 6-8 hours.  Heat to site, lidocaine  patches as needed.  Exercises to area as tolerated.  Follow up in 2 weeks for blood pressure.   It was a pleasure to see you today!

## 2024-06-15 NOTE — Progress Notes (Signed)
 Established Patient Office Visit  Subjective   Patient ID: Michelle David, female    DOB: [birth year]  Age: 63 y.o. MRN: [id number]  Chief Complaint  Patient presents with   Hypertension    Patient here today for BP f/u. Patient taking losartan , carvedliol and spironolactone . BP hasn't been checked in awhile due to moving and hasn't been unpacked yet.    Knee Pain    Right knee more then left knee x couple months. Patient also having back pain x couple months. Patient thinks she also has some swelling in her knees.     Hypertension Pertinent negatives include no chest pain, headaches or shortness of breath.  Knee Pain    Discussed the use of AI scribe software for clinical note transcription with the patient, who gave verbal consent to proceed.  History of Present Illness She has been experiencing elevated blood pressure and has not been monitoring it at home due to a recent move where her blood pressure cuff is packed away. She is on Coreg  25 mg twice a day, Lasix  20 mg once a day, losartan  100 mg once a day, and spironolactone  25 mg once a day for her hypertension and heart failure. She missed a dose of her medication last week and has been trying to reduce her intake of Pepsi and salt. No chest pain, shortness of breath, or blurred vision, except for shortness of breath when climbing stairs.   She has been experiencing right knee pain for the past two months, which is worse than the left knee. The pain is constant, with some swelling, and is exacerbated by weight-bearing activities and weather changes. She reports she has not been told she has arthritis in her knees and has not had any falls. She has been offered physical therapy in the past but has not pursued it due to travel constraints.  She has a history of chronic back pain, which has been ongoing for many years. She has been using muscle relaxers, Tylenol , and ibuprofen  for management.  She has a history of a thyroid  nodule,  which was checked earlier this year, and she has no diabetes. Her cholesterol was reported as 'beautiful' at the last check. She has gained a pound since her last visit, weighing 230 pounds today. She recently moved and has been busy with her husband's care, which has impacted her medication adherence.   Patient Active Problem List   Diagnosis Date Noted   History of colonic polyps 01/06/2024   Encounter for screening and preventative care 12/03/2023   Thyroid  nodule 08/21/2020   Chronic combined systolic and diastolic CHF (congestive heart failure) (HCC) 03/30/2017   Hyperlipidemia LDL goal <100 05/29/2013   Heart murmur 04/22/2011   Back pain 12/04/2008   Morbid obesity 03/31/2008   Malignant essential hypertension with congestive heart failure (HCC) 03/31/2008   Past Medical History:  Diagnosis Date   Abnormal EKG 02/05/2022   Allergic rhinitis 06/13/2017   Alopecia 07/08/2021   Blood in stool    Cardiomyopathy (HCC)    Diagnosed March 2018   CHF (congestive heart failure) (HCC)    a. EF 30-35% by echo in 12/2016 b. EF improved to 45-50% in 07/2018 c. 10/2020: EF improved to 50-55%.   Chronic hand pain, left 06/10/2020   Dyspepsia    Essential hypertension    Heart disease    Heart murmur    Hyperlipidemia    Hypoxia 11/01/2018   Impaired glucose tolerance    Insomnia due to  anxiety and fear 07/08/2021   Multifocal pneumonia 11/01/2018   Obesity    Osteoarthritis of both knees 01/22/2018   Poor dentition 08/20/2017   Sleep apnea 12/17/2016   Snoring 01/22/2018   Solitary thyroid  nodule 03/01/2015   Dominant left thyroid  nodule 3.2 cm noted in 02/2015    Vitamin D  deficiency 03/17/2022   Past Surgical History:  Procedure Laterality Date   ABDOMINAL HYSTERECTOMY     COLONOSCOPY N/A 03/19/2018   Procedure: COLONOSCOPY;  Surgeon: Harvey Margo CROME, MD;  Location: [facility];  Service: Endoscopy;  Laterality: N/A;  2:00   COLONOSCOPY WITH PROPOFOL  N/A 01/06/2024    Procedure: COLONOSCOPY WITH PROPOFOL ;  Surgeon: Unk Corinn Skiff, MD;  Location: [facility] ENDOSCOPY;  Service: Gastroenterology;  Laterality: N/A;   OOPHORECTOMY     TUBAL LIGATION     No Known Allergies       06/15/2024   10:29 AM 12/03/2023    9:23 AM 11/18/2023   12:32 PM  Depression screen PHQ 2/9  Decreased Interest 0 0 0  Down, Depressed, Hopeless 0 0 0  PHQ - 2 Score 0 0 0  Altered sleeping 2 1 1   Tired, decreased energy 0 0 0  Change in appetite 0 2 1  Feeling bad or failure about yourself  0 0 0  Trouble concentrating 0 0 0  Moving slowly or fidgety/restless 0 2 0  Suicidal thoughts 0 0 0  PHQ-9 Score 2 5 2   Difficult doing work/chores Not difficult at all Not difficult at all Not difficult at all       06/15/2024   10:29 AM 12/03/2023    9:23 AM 11/18/2023   12:33 PM  GAD 7 : Generalized Anxiety Score  Nervous, Anxious, on Edge 0 0 0  Control/stop worrying 0 0 0  Worry too much - different things 1 0 0  Trouble relaxing 0 0 0  Restless 1 1 0  Easily annoyed or irritable 0 0 0  Afraid - awful might happen 0 0 0  Total GAD 7 Score 2 1 0  Anxiety Difficulty Not difficult at all Somewhat difficult Not difficult at all      Review of Systems  Constitutional:  Negative for chills and fever.  Respiratory:  Negative for shortness of breath.   Cardiovascular:  Negative for chest pain.  Gastrointestinal:  Negative for abdominal pain, constipation, diarrhea, heartburn, nausea and vomiting.  Genitourinary:  Negative for dysuria, frequency and urgency.  Musculoskeletal:  Positive for back pain and joint pain.  Neurological:  Negative for dizziness and headaches.  Endo/Heme/Allergies:  Negative for polydipsia.  Psychiatric/Behavioral:  Negative for depression and suicidal ideas. The patient is not nervous/anxious.       Objective:     BP 130/82   Pulse 73   Temp 98.2 F (36.8 C) (Oral)   Ht 5' 1 (1.549 m)   Wt 230 lb (104.3 kg)   SpO2 99%   BMI 43.46 kg/m   BP Readings from Last 3 Encounters:  06/15/24 130/82  01/06/24 (!) 145/74  12/03/23 128/80   Wt Readings from Last 3 Encounters:  06/15/24 230 lb (104.3 kg)  01/06/24 223 lb (101.2 kg)  12/03/23 229 lb (103.9 kg)      Physical Exam Vitals and nursing note reviewed.  Constitutional:      Appearance: Normal appearance.  Cardiovascular:     Rate and Rhythm: Normal rate and regular rhythm.     Pulses: Normal pulses.  Heart sounds: Normal heart sounds.  Pulmonary:     Effort: Pulmonary effort is normal.     Breath sounds: Normal breath sounds.  Musculoskeletal:        General: Swelling and tenderness present.     Right knee: Decreased range of motion. Tenderness present.     Left knee: Normal range of motion. No tenderness.     Right lower leg: No edema.     Left lower leg: No edema.  Neurological:     Mental Status: She is alert and oriented to person, place, and time.  Psychiatric:        Mood and Affect: Mood normal.        Behavior: Behavior normal.        Thought Content: Thought content normal.        Judgment: Judgment normal.      No results found for any visits on 06/15/24.     The 10-year ASCVD risk score (Arnett DK, et al., 2019) is: 5.6%    Assessment & Plan:  Malignant essential hypertension with congestive heart failure (HCC) -     Comprehensive metabolic panel with GFR  Acute pain of right knee -     DG Knee 4 Views W/Patella Right  Other acute back pain -     Lidocaine ; Place 1 patch onto the skin daily as needed (every 24 hours as needed for back pain.).  Dispense: 30 patch; Refill: 2  Prediabetes -     Hemoglobin A1c    Assessment and Plan Assessment & Plan Hypertensive heart disease with heart failure Blood pressure remains elevated, particularly diastolic. Non-adherence to medication noted. Increased soda and dietary salt intake may contribute to hypertension.  - Check BP at home daily for two weeks. - Schedule follow-up  appointment in two weeks  - Encourage reduction of dietary salt and soda intake. - Reinforce importance of medication adherence. - Check kidney and liver function tests. - Monitor weight closely and report significant changes to cardiology.  Right knee pain Chronic right knee pain with swelling. Pain worsens with weight-bearing activities. Differential includes arthritis. - Order x-ray of right knee. - Provide lidocaine  patches for pain management. - Offer physical therapy if she changes her mind. - Provide exercises for knee pain management.  Other acute back pain Chronic back pain managed with muscle relaxers, Tylenol , and ibuprofen . She requested lidocaine  patches for additional relief. - Prescribe lidocaine  patches for back pain. - Provide exercises for back pain management.  Morbid obesity Current weight is 230 pounds. Discussed weight management strategies. - Encourage dietary modifications and regular exercise. - Monitor weight closely.  Hyperlipidemia Cholesterol levels previously well-controlled with atorvastatin . Unable to check levels today due to recent food intake. - Continue atorvastatin  20 mg daily.  General Health Maintenance Colonoscopy and mammogram completed. No diabetes. Thyroid  nodule previously evaluated. - Continue annual thyroid  function tests.   Return in about 2 weeks (around 06/29/2024) for blood pressure.    Carrol Aurora, NP

## 2024-06-16 ENCOUNTER — Ambulatory Visit: Payer: Self-pay | Admitting: General Practice

## 2024-06-16 DIAGNOSIS — R944 Abnormal results of kidney function studies: Secondary | ICD-10-CM

## 2024-07-01 ENCOUNTER — Ambulatory Visit: Admitting: General Practice

## 2024-07-01 ENCOUNTER — Encounter: Payer: Self-pay | Admitting: General Practice

## 2024-07-01 ENCOUNTER — Ambulatory Visit: Payer: Self-pay | Admitting: General Practice

## 2024-07-01 VITALS — BP 132/78 | HR 70 | Temp 98.1°F | Ht 61.0 in | Wt 232.2 lb

## 2024-07-01 DIAGNOSIS — E785 Hyperlipidemia, unspecified: Secondary | ICD-10-CM | POA: Diagnosis not present

## 2024-07-01 DIAGNOSIS — R944 Abnormal results of kidney function studies: Secondary | ICD-10-CM | POA: Diagnosis not present

## 2024-07-01 DIAGNOSIS — M791 Myalgia, unspecified site: Secondary | ICD-10-CM

## 2024-07-01 DIAGNOSIS — Z23 Encounter for immunization: Secondary | ICD-10-CM | POA: Diagnosis not present

## 2024-07-01 DIAGNOSIS — I110 Hypertensive heart disease with heart failure: Secondary | ICD-10-CM | POA: Diagnosis not present

## 2024-07-01 LAB — BASIC METABOLIC PANEL WITH GFR
BUN: 17 mg/dL (ref 6–23)
CO2: 32 meq/L (ref 19–32)
Calcium: 9.4 mg/dL (ref 8.4–10.5)
Chloride: 105 meq/L (ref 96–112)
Creatinine, Ser: 1.18 mg/dL (ref 0.40–1.20)
GFR: 50.1 mL/min — ABNORMAL LOW (ref >60.00)
Glucose, Bld: 94 mg/dL (ref 70–99)
Potassium: 3.9 meq/L (ref 3.5–5.1)
Sodium: 141 meq/L (ref 135–145)

## 2024-07-01 NOTE — Patient Instructions (Addendum)
 Stop by the lab prior to leaving today. I will notify you of your results once received.   Continue blood pressure medication as prescribed.   Stop Atorvastatin  20 mg once daily for two weeks.   F/u in 2 weeks.   It was a pleasure to see you today!

## 2024-07-01 NOTE — Progress Notes (Signed)
 Established Patient Office Visit  Subjective   Patient ID: Michelle David, female    DOB: Nov 20, 1960  Age: 63 y.o. MRN: [id number]  Chief Complaint  Patient presents with  . Hypertension    Patient following up on BP; currently taking losartan  and carvedilol . Patient states BP has been normal but has had a couple of abnormal readings; forgot her log today.    . joint aches    Wants to discuss changing statin    Hypertension Pertinent negatives include no chest pain, headaches or shortness of breath.    Michelle David is a 63 year old female with past medical history of HTN, CHF, thyroid  nudle heart murmur, HLD presents today for a follow up.   Discussed the use of AI scribe software for clinical note transcription with the patient, who gave verbal consent to proceed.  History of Present Illness She has been experiencing joint pain, particularly in her knees and back. The knee pain is described as a pulling sensation, especially when walking, with stiffness in both knees. She has been on atorvastatin  for a long time and is concerned it may be contributing to her pain. A knee x-ray was performed on 06/15/2024 her right knee shows arthritis with trace effusion.  She has been trying to do knee exercises but has had minimal relief.   Her blood pressure readings at home have been fluctuating, described as 'up and down'.  Home readings range between 1 20s to 130s /70s to 80s.  She is currently managed on carvedilol  25 mg twice daily losartan  100 mg once daily, spironolactone  25 mg once daily, and Lasix  20 mg once daily.  She denies any headaches, blurry vision, chest pain, shortness of breath, leg swelling.   Patient Active Problem List   Diagnosis Date Noted  . History of colonic polyps 01/06/2024  . Encounter for screening and preventative care 12/03/2023  . Thyroid  nodule 08/21/2020  . Chronic combined systolic and diastolic CHF (congestive heart failure) (HCC) 03/30/2017  .  Hyperlipidemia LDL goal <100 05/29/2013  . Heart murmur 04/22/2011  . Back pain 12/04/2008  . Morbid obesity 03/31/2008  . Malignant essential hypertension with congestive heart failure (HCC) 03/31/2008   Past Medical History:  Diagnosis Date  . Abnormal EKG 02/05/2022  . Allergic rhinitis 06/13/2017  . Alopecia 07/08/2021  . Blood in stool   . Cardiomyopathy [facility])    Diagnosed March 2018  . CHF (congestive heart failure) (HCC)    a. EF 30-35% by echo in 12/2016 b. EF improved to 45-50% in 07/2018 c. 10/2020: EF improved to 50-55%.  . Chronic hand pain, left 06/10/2020  . Dyspepsia   . Essential hypertension   . Heart disease   . Heart murmur   . Hyperlipidemia   . Hypoxia 11/01/2018  . Impaired glucose tolerance   . Insomnia due to anxiety and fear 07/08/2021  . Multifocal pneumonia 11/01/2018  . Obesity   . Osteoarthritis of both knees 01/22/2018  . Poor dentition 08/20/2017  . Sleep apnea 12/17/2016  . Snoring 01/22/2018  . Solitary thyroid  nodule 03/01/2015   Dominant left thyroid  nodule 3.2 cm noted in 02/2015   . Vitamin D  deficiency 03/17/2022   Past Surgical History:  Procedure Laterality Date  . ABDOMINAL HYSTERECTOMY    . COLONOSCOPY N/A 03/19/2018   Procedure: COLONOSCOPY;  Surgeon: Harvey Margo CROME, MD;  Location: [facility];  Service: Endoscopy;  Laterality: N/A;  2:00  . COLONOSCOPY WITH PROPOFOL  N/A 01/06/2024  Procedure: COLONOSCOPY WITH PROPOFOL ;  Surgeon: Unk Corinn Skiff, MD;  Location: [facility] ENDOSCOPY;  Service: Gastroenterology;  Laterality: N/A;  . OOPHORECTOMY    . TUBAL LIGATION     No Known Allergies       07/01/2024   10:09 AM 06/15/2024   10:29 AM 12/03/2023    9:23 AM  Depression screen PHQ 2/9  Decreased Interest 0 0 0  Down, Depressed, Hopeless 0 0 0  PHQ - 2 Score 0 0 0  Altered sleeping 0 2 1  Tired, decreased energy 0 0 0  Change in appetite 0 0 2  Feeling bad or failure about yourself  0 0 0  Trouble concentrating 0 0 0   Moving slowly or fidgety/restless 0 0 2  Suicidal thoughts 0 0 0  PHQ-9 Score 0 2 5  Difficult doing work/chores Not difficult at all Not difficult at all Not difficult at all       07/01/2024   10:10 AM 06/15/2024   10:29 AM 12/03/2023    9:23 AM 11/18/2023   12:33 PM  GAD 7 : Generalized Anxiety Score  Nervous, Anxious, on Edge 0 0 0 0  Control/stop worrying 0 0 0 0  Worry too much - different things 0 1 0 0  Trouble relaxing 0 0 0 0  Restless 0 1 1 0  Easily annoyed or irritable 0 0 0 0  Afraid - awful might happen 0 0 0 0  Total GAD 7 Score 0 2 1 0  Anxiety Difficulty Not difficult at all Not difficult at all Somewhat difficult Not difficult at all      Review of Systems  Constitutional:  Negative for chills and fever.  Respiratory:  Negative for shortness of breath.   Cardiovascular:  Negative for chest pain.  Gastrointestinal:  Negative for abdominal pain, constipation, diarrhea, heartburn, nausea and vomiting.  Genitourinary:  Negative for dysuria, frequency and urgency.  Musculoskeletal:  Positive for joint pain and myalgias.  Neurological:  Negative for dizziness and headaches.  Endo/Heme/Allergies:  Negative for polydipsia.  Psychiatric/Behavioral:  Negative for depression and suicidal ideas. The patient is not nervous/anxious.       Objective:     BP 132/78   Pulse 70   Temp 98.1 F (36.7 C) (Oral)   Ht 5' 1 (1.549 m)   Wt 232 lb 3.2 oz (105.3 kg)   SpO2 95%   BMI 43.87 kg/m  BP Readings from Last 3 Encounters:  07/01/24 132/78  06/15/24 130/82  01/06/24 (!) 145/74   Wt Readings from Last 3 Encounters:  07/01/24 232 lb 3.2 oz (105.3 kg)  06/15/24 230 lb (104.3 kg)  01/06/24 223 lb (101.2 kg)      Physical Exam Vitals and nursing note reviewed.  Constitutional:      Appearance: Normal appearance.  Cardiovascular:     Rate and Rhythm: Normal rate and regular rhythm.     Pulses: Normal pulses.     Heart sounds: Normal heart sounds.   Pulmonary:     Effort: Pulmonary effort is normal.     Breath sounds: Normal breath sounds.  Musculoskeletal:     Right knee: Swelling present. Decreased range of motion. Tenderness present.     Left knee: Normal.  Neurological:     Mental Status: She is alert and oriented to person, place, and time.  Psychiatric:        Mood and Affect: Mood normal.        Behavior: Behavior  normal.        Thought Content: Thought content normal.        Judgment: Judgment normal.      No results found for any visits on 07/01/24.     The 10-year ASCVD risk score (Arnett DK, et al., 2019) is: 5.8%    Assessment & Plan:  Malignant essential hypertension with congestive heart failure (HCC)  Decreased GFR -     Basic metabolic panel with GFR  Encounter for immunization -     Flu vaccine trivalent PF, 6mos and older(Flulaval,Afluria,Fluarix,Fluzone)  Myalgia  Hyperlipidemia LDL goal <100  Morbid obesity (HCC)    Assessment and Plan Assessment & Plan Hypertensive heart disease with combined systolic and diastolic heart failure Blood pressure fluctuating but controlled with current medications. -BP at goal today. - Continue carvedilol  25 mg twice daily, Lasix  20 mg once daily, spironolactone  25 mg once daily and losartan  100 mg once daily. - Monitor blood pressure at home and report significant changes.  Right knee tricompartmental osteoarthritis with effusion X-ray confirmed tricompartmental osteoarthritis with trace effusion. Symptoms include stiffness, swelling, and decreased mobility. She declined knee injections and physical therapy. - Provide reading materials on arthritis management. - Encourage lifestyle changes, including weight loss and exercise.  Generalized myalgia Generalized myalgia in legs and arms, possibly related to atorvastatin . - Discontinue atorvastatin  for two weeks to assess impact on myalgia. - Reassess symptoms in two weeks.  Morbid obesity She is aware  of the need for weight loss. Discussed dietary changes and exercise as primary interventions. - Encourage dietary modifications, including reducing salt intake and avoiding sugary drinks. - Encourage regular physical activity, such as walking 30 minutes daily.  Hyperlipidemia Currently managed with atorvastatin , temporarily discontinued to assess role in myalgia. - Reassess lipid management after two-week atorvastatin  discontinuation.   Return in about 2 weeks (around 07/15/2024) for myalgias.    Carrol Aurora, NP

## 2024-07-15 ENCOUNTER — Ambulatory Visit: Admitting: General Practice

## 2024-07-20 ENCOUNTER — Ambulatory Visit (INDEPENDENT_AMBULATORY_CARE_PROVIDER_SITE_OTHER): Admitting: General Practice

## 2024-07-20 ENCOUNTER — Encounter: Payer: Self-pay | Admitting: General Practice

## 2024-07-20 VITALS — BP 124/80 | HR 76 | Temp 98.0°F | Ht 61.0 in | Wt 230.0 lb

## 2024-07-20 DIAGNOSIS — E785 Hyperlipidemia, unspecified: Secondary | ICD-10-CM

## 2024-07-20 DIAGNOSIS — M791 Myalgia, unspecified site: Secondary | ICD-10-CM | POA: Diagnosis not present

## 2024-07-20 DIAGNOSIS — J449 Chronic obstructive pulmonary disease, unspecified: Secondary | ICD-10-CM | POA: Insufficient documentation

## 2024-07-20 MED ORDER — ROSUVASTATIN CALCIUM 5 MG PO TABS: 5.0000 mg | ORAL_TABLET | Freq: Every day | ORAL | 0 refills | Status: DC

## 2024-07-20 NOTE — Assessment & Plan Note (Signed)
 Controlled.  Continue Spiriva and albuterol  as needed.  Does not follow with pulmonologist.

## 2024-07-20 NOTE — Patient Instructions (Addendum)
 Stop atorvastatin .  Start rosuvastatin  5 mg once daily. Follow up in 3 months.   Come back on 09/06/24 to the office- pharmacist will give you the tetanus shot and the first dose of the shingles shot.   Let me know if the body aches come back.   It was a pleasure to see you today!

## 2024-07-20 NOTE — Progress Notes (Signed)
 Established Patient Office Visit  Subjective   Patient ID: Michelle David, female    DOB: Aug 13, 1961  Age: 63 y.o. MRN: [id number]  Chief Complaint  Patient presents with   myalgia    Doing better    HPI  Michelle David is a 63 year old female with past medical history of CHF, obesity, HLD presents today for a follow up.   Discussed the use of AI scribe software for clinical note transcription with the patient, who gave verbal consent to proceed.  History of Present Illness She has been experiencing body aches, which were suspected to be related to her cholesterol medication, atorvastatin . After discontinuing atorvastatin  for the last two weeks, she notes significant improvement in her symptoms, including reduced back pain.  Her cholesterol was last checked in February and was normal, but her LDL needs to be below 70. She is currently not on any cholesterol medication since stopping atorvastatin . She has stopped drinking Pepsi as part of her dietary changes.  She has a history of COPD diagnosed years ago and uses Spiriva as needed.   Patient Active Problem List   Diagnosis Date Noted   Chronic obstructive pulmonary disease, unspecified COPD type (HCC) 07/20/2024   History of colonic polyps 01/06/2024   Encounter for screening and preventative care 12/03/2023   Thyroid  nodule 08/21/2020   Chronic combined systolic and diastolic CHF (congestive heart failure) (HCC) 03/30/2017   Hyperlipidemia LDL goal <100 05/29/2013   Heart murmur 04/22/2011   Back pain 12/04/2008   Morbid obesity 03/31/2008   Malignant essential hypertension with congestive heart failure (HCC) 03/31/2008   Past Medical History:  Diagnosis Date   Abnormal EKG 02/05/2022   Allergic rhinitis 06/13/2017   Alopecia 07/08/2021   Blood in stool    Cardiomyopathy (HCC)    Diagnosed March 2018   CHF (congestive heart failure) (HCC)    a. EF 30-35% by echo in 12/2016 b. EF improved to 45-50% in 07/2018  c. 10/2020: EF improved to 50-55%.   Chronic hand pain, left 06/10/2020   Dyspepsia    Essential hypertension    Heart disease    Heart murmur    Hyperlipidemia    Hypoxia 11/01/2018   Impaired glucose tolerance    Insomnia due to anxiety and fear 07/08/2021   Multifocal pneumonia 11/01/2018   Obesity    Osteoarthritis of both knees 01/22/2018   Poor dentition 08/20/2017   Sleep apnea 12/17/2016   Snoring 01/22/2018   Solitary thyroid  nodule 03/01/2015   Dominant left thyroid  nodule 3.2 cm noted in 02/2015    Vitamin D  deficiency 03/17/2022   Past Surgical History:  Procedure Laterality Date   ABDOMINAL HYSTERECTOMY     COLONOSCOPY N/A 03/19/2018   Procedure: COLONOSCOPY;  Surgeon: Harvey Margo CROME, MD;  Location: [facility];  Service: Endoscopy;  Laterality: N/A;  2:00   COLONOSCOPY WITH PROPOFOL  N/A 01/06/2024   Procedure: COLONOSCOPY WITH PROPOFOL ;  Surgeon: Unk Corinn Skiff, MD;  Location: [facility] ENDOSCOPY;  Service: Gastroenterology;  Laterality: N/A;   OOPHORECTOMY     TUBAL LIGATION     No Known Allergies       07/20/2024   11:02 AM 07/01/2024   10:09 AM 06/15/2024   10:29 AM  Depression screen PHQ 2/9  Decreased Interest 0 0 0  Down, Depressed, Hopeless 0 0 0  PHQ - 2 Score 0 0 0  Altered sleeping 0 0 2  Tired, decreased energy 0 0 0  Change in  appetite 0 0 0  Feeling bad or failure about yourself  0 0 0  Trouble concentrating 0 0 0  Moving slowly or fidgety/restless 0 0 0  Suicidal thoughts 0 0 0  PHQ-9 Score 0 0 2  Difficult doing work/chores Not difficult at all Not difficult at all Not difficult at all       07/20/2024   11:03 AM 07/01/2024   10:10 AM 06/15/2024   10:29 AM 12/03/2023    9:23 AM  GAD 7 : Generalized Anxiety Score  Nervous, Anxious, on Edge 0 0 0 0  Control/stop worrying 0 0 0 0  Worry too much - different things 0 0 1 0  Trouble relaxing 0 0 0 0  Restless 0 0 1 1  Easily annoyed or irritable 0 0 0 0  Afraid - awful might happen 0  0 0 0  Total GAD 7 Score 0 0 2 1  Anxiety Difficulty Not difficult at all Not difficult at all Not difficult at all Somewhat difficult      Review of Systems  Constitutional:  Negative for chills and fever.  Respiratory:  Negative for shortness of breath.   Cardiovascular:  Negative for chest pain.  Gastrointestinal:  Negative for abdominal pain, constipation, diarrhea, heartburn, nausea and vomiting.  Genitourinary:  Negative for dysuria, frequency and urgency.  Neurological:  Negative for dizziness and headaches.  Endo/Heme/Allergies:  Negative for polydipsia.  Psychiatric/Behavioral:  Negative for depression and suicidal ideas. The patient is not nervous/anxious.       Objective:     BP 124/80   Pulse 76   Temp 98 F (36.7 C) (Temporal)   Ht 5' 1 (1.549 m)   Wt 230 lb (104.3 kg)   SpO2 94%   BMI 43.46 kg/m  BP Readings from Last 3 Encounters:  07/20/24 124/80  07/01/24 132/78  06/15/24 130/82   Wt Readings from Last 3 Encounters:  07/20/24 230 lb (104.3 kg)  07/01/24 232 lb 3.2 oz (105.3 kg)  06/15/24 230 lb (104.3 kg)      Physical Exam Vitals and nursing note reviewed.  Constitutional:      Appearance: Normal appearance.  Cardiovascular:     Rate and Rhythm: Normal rate and regular rhythm.     Pulses: Normal pulses.     Heart sounds: Normal heart sounds.  Pulmonary:     Effort: Pulmonary effort is normal.     Breath sounds: Normal breath sounds.  Neurological:     Mental Status: She is alert and oriented to person, place, and time.  Psychiatric:        Mood and Affect: Mood normal.        Behavior: Behavior normal.        Thought Content: Thought content normal.        Judgment: Judgment normal.      No results found for any visits on 07/20/24.     The 10-year ASCVD risk score (Arnett DK, et al., 2019) is: 4.9%    Assessment & Plan:  Hyperlipidemia LDL goal <100 -     Rosuvastatin  Calcium ; Take 1 tablet (5 mg total) by mouth daily.   Dispense: 90 tablet; Refill: 0  Myalgia  Chronic obstructive pulmonary disease, unspecified COPD type (HCC) Assessment & Plan: Controlled.  Continue Spiriva and albuterol  as needed.  Does not follow with pulmonologist.    Assessment and Plan Assessment & Plan Hyperlipidemia LDL target below 70. Atorvastatin  discontinued due to myalgia. - Start rosuvastatin   5 mg daily. Rx sent. - Discussed potential for initial muscle aches, advised to report if persistent. - Follow-up in three months to assess cholesterol levels.  Chronic obstructive pulmonary disease (COPD) - Continue Spiriva as needed. - Use Breyna and albuterol  as needed. - Consider pulmonology referral if symptoms worsen.  Obesity Commended patient on her dietary changes.  - Discussed to continue lifestyle changes, including reducing soda intake.  General Health Maintenance Addressed concerns about shingles vaccine reactions. - Schedule tetanus and shingles vaccinations on September 06, 2024. - Administer tetanus and first dose of shingles vaccine on September 06, 2024.    Return in about 3 months (around 10/20/2024) for hyperlipidemia and chronic care management. (come fasting).    Carrol Aurora, NP

## 2024-07-22 ENCOUNTER — Ambulatory Visit: Attending: Medical | Admitting: Medical

## 2024-07-22 ENCOUNTER — Encounter: Payer: Self-pay | Admitting: Medical

## 2024-07-22 ENCOUNTER — Other Ambulatory Visit: Payer: Self-pay

## 2024-07-22 VITALS — BP 182/87 | HR 65 | Ht 62.0 in | Wt 231.2 lb

## 2024-07-22 DIAGNOSIS — I5042 Chronic combined systolic (congestive) and diastolic (congestive) heart failure: Secondary | ICD-10-CM | POA: Diagnosis not present

## 2024-07-22 DIAGNOSIS — I428 Other cardiomyopathies: Secondary | ICD-10-CM | POA: Diagnosis not present

## 2024-07-22 DIAGNOSIS — I1A0 Resistant hypertension: Secondary | ICD-10-CM | POA: Diagnosis not present

## 2024-07-22 DIAGNOSIS — E782 Mixed hyperlipidemia: Secondary | ICD-10-CM | POA: Diagnosis not present

## 2024-07-22 DIAGNOSIS — R011 Cardiac murmur, unspecified: Secondary | ICD-10-CM | POA: Diagnosis not present

## 2024-07-22 MED ORDER — LOSARTAN POTASSIUM 100 MG PO TABS
100.0000 mg | ORAL_TABLET | Freq: Every day | ORAL | 3 refills | Status: DC
  Filled 2024-07-22: qty 90, 90d supply, fill #0

## 2024-07-22 MED ORDER — FUROSEMIDE 20 MG PO TABS
20.0000 mg | ORAL_TABLET | Freq: Every day | ORAL | 1 refills | Status: AC
  Filled 2024-07-22: qty 90, 90d supply, fill #0

## 2024-07-22 NOTE — Patient Instructions (Signed)
 Medication Instructions:  Your physician recommends that you continue on your current medications as directed. Please refer to the Current Medication list given to you today.    *If you need a refill on your cardiac medications before your next appointment, please call your pharmacy*  Lab Work: No labs ordered today    Testing/Procedures: No test ordered today   Follow-Up: At [facility], you and your health needs are our priority.  As part of our continuing mission to provide you with exceptional heart care, our providers are all part of one team.  This team includes your primary Cardiologist (physician) and Advanced Practice Providers or APPs (Physician Assistants and Nurse Practitioners) who all work together to provide you with the care you need, when you need it.  Your next appointment:   1 year(s)  Provider:   You may see Jayson Sierras, MD or one of the following Advanced Practice Providers on your designated Care Team:   Lonni Meager, NP Lesley Maffucci, PA-C Bernardino Bring, PA-C Cadence Noxapater, PA-C Tylene Lunch, NP Barnie Hila, NP

## 2024-07-22 NOTE — Progress Notes (Signed)
 Cardiology Office Note   Date:  07/22/2024  ID:  Bhakti, Labella [birth year], MRN [id number] PCP: Vincente Shivers, NP  [facility] Providers Cardiologist:  Jayson Sierras, MD   History of Present Illness Michelle David is a 63 y.o. female  with a history of presumed nonischemic cardiomyopathy  (EF 30 to 35% in 2018, 45 to 50% by repeat echo 2019), hypertension and hyperlipidemia presents for follow-up.   Myoview  Lexiscan in 2012 for chest discomfort that was negative.  EF was normal.  Echo in 2012 showed EF of 55%.  Echo in 2018 showed EF of 30 to 35%, mild to moderate LVH, grade 2 diastolic dysfunction.  Echo 07/2017 showed EF of 35 to 40%, grade 1 diastolic dysfunction, mild AI.  Echo from 07/2018 showed EF 45 to 50%, grade 1 diastolic dysfunction, mild MR.  Cardiac CTA 2019 showed mild calcific atherosclerosis.  Cardiomyopathy was suspected nonischemic, but no ischemic testing was done at that time.  Echo in 2022 showed EF of 50 to 55%, mild LVH, mild MR, trivial AI. Echo 12/2021 showed EF 50 to 55%, grade 1 diastolic dysfunction, moderate LVH, trivial MR.  The patient was last seen 11/24/23 and was stable from a cardiac perspective.   Today, the patient is overall doing well. BP is high, but she did not take her BP medication this AM. She denies chest pain. She has SOB when she exerts herself. She has LLE when she eats a lot of salt. She denies lightheadedness and dizziness. She has cut down on her soda intake. She has been doing a little walking.   Studies Reviewed EKG Interpretation Date/Time:  Friday July 22 2024 11:28:47 EDT Ventricular Rate:  65 PR Interval:  134 QRS Duration:  102 QT Interval:  428 QTC Calculation: 445 R Axis:   15  Text Interpretation: Normal sinus rhythm Left ventricular hypertrophy with repolarization abnormality ( Cornell product ) Cannot rule out Septal infarct , age undetermined When compared with ECG of 24-Nov-2023 10:05, Minimal criteria  for Septal infarct are now Present Nonspecific T wave abnormality has replaced inverted T waves in Anterior leads Confirmed by Franchester, Shital Crayton ([id number]) on 07/22/2024 11:38:00 AM    Echo March 2023 1. Left ventricular ejection fraction, by estimation, is 50 to 55%. The  left ventricle has low normal function. The left ventricle has no regional  wall motion abnormalities. There is moderate concentric left ventricular  hypertrophy. Left ventricular  diastolic parameters are consistent with Grade I diastolic dysfunction  (impaired relaxation).   2. Right ventricular systolic function is normal. The right ventricular  size is normal. Tricuspid regurgitation signal is inadequate for assessing  PA pressure.   3. The mitral valve is abnormal. Trivial mitral valve regurgitation. No  evidence of mitral stenosis.   4. The aortic valve is tricuspid. Aortic valve regurgitation is not  visualized. No aortic stenosis is present.   5. The inferior vena cava is normal in size with greater than 50%  respiratory variability, suggesting right atrial pressure of 3 mmHg.   Comparison(s): No significant change from prior study.    Echo January 2022  1. Left ventricular ejection fraction, by estimation, is 50 to 55%. The  left ventricle has low normal function. The left ventricle has no regional  wall motion abnormalities. There is mild left ventricular hypertrophy.  Left ventricular diastolic  parameters are indeterminate.   2. Right ventricular systolic function is normal. The right ventricular  size is normal. Tricuspid regurgitation signal  is inadequate for assessing  PA pressure.   3. The mitral valve is grossly normal. Mild mitral valve regurgitation.   4. The aortic valve is tricuspid. Aortic valve regurgitation is trivial.   5. The inferior vena cava is normal in size with greater than 50%  respiratory variability, suggesting right atrial pressure of 3 mmHg.    Echo October 2019 Study Conclusions    - Left ventricle: The cavity size was normal. Wall thickness was    increased in a pattern of moderate LVH. Systolic function was    mildly reduced. The estimated ejection fraction was in the range    of 45% to 50%. Doppler parameters are consistent with abnormal    left ventricular relaxation (grade 1 diastolic dysfunction).  - Aortic valve: Valve area (VTI): 1.86 cm^2. Valve area (Vmax):    2.01 cm^2. Valve area (Vmean): 1.97 cm^2.  - Mitral valve: There was mild regurgitation.  - Technically difficult study. Echocontrast was used to enhance    visualization.    Echo October 2018 tudy Conclusions   - Left ventricle: The cavity size was normal. There was mild    concentric hypertrophy. Systolic function was moderately reduced.    The estimated ejection fraction was in the range of 35% to 40%.    Diffuse hypokinesis. Doppler parameters are consistent with    abnormal left ventricular relaxation (grade 1 diastolic    dysfunction). Doppler parameters are consistent with high    ventricular filling pressure.  - Aortic valve: Transvalvular velocity was within the normal range.    There was no stenosis. There was mild regurgitation.  - Mitral valve: Transvalvular velocity was within the normal range.    There was no evidence for stenosis. There was mild regurgitation.  - Right ventricle: The cavity size was normal. Wall thickness was    normal. Systolic function was normal.    Myoview  Lexiscan 2012  IMPRESSION:  Probably negative stress nuclear myocardial study revealing  impaired exercise capacity, and a nondiagnostic stress EKG due to  the presence of baseline ST-segment abnormalities, mild left  ventricular dilatation and mild left ventricular dysfunction in a  segmental pattern.   Despite the apparent wall motion abnormality,  no definite perfusion abnormalities were identified.  An anterior  defect was consistent with breast attenuation, while changes at the  base of the  inferior wall may have represented diaphragmatic  attenuation; however, the presence of a small amount of scarring  cannot be excluded.  Other findings as noted.      Physical Exam VS:  BP (!) 182/87   Pulse 65   Ht 5' 2 (1.575 m)   Wt 231 lb 3.2 oz (104.9 kg)   SpO2 99%   BMI 42.29 kg/m        Wt Readings from Last 3 Encounters:  07/22/24 231 lb 3.2 oz (104.9 kg)  07/20/24 230 lb (104.3 kg)  07/01/24 232 lb 3.2 oz (105.3 kg)    GEN: Well nourished, well developed in no acute distress NECK: No JVD; No carotid bruits CARDIAC: RRR, no murmurs, rubs, gallops RESPIRATORY:  Clear to auscultation without rales, wheezing or rhonchi  ABDOMEN: Soft, non-tender, non-distended EXTREMITIES:  No edema; No deformity   ASSESSMENT AND PLAN  HFrEF Presumed NICM Repeat echocardiogram in 2023 showed LVEF 50 to 55%, no wall motion abnormalities, moderate LVH, grade 1 diastolic dysfunction, trivial MR.  Patient has swelling when she eats a lot of salt.  Recommended low-salt diet.  She does not  appear volume up on exam.  Continue Coreg  25 mg twice daily, Lasix  20 mg daily, spironolactone  25 mg daily, losartan  100 mg daily.  I will send in refills of losartan  and Lasix .  Hypertension Blood pressure is elevated today, but she has not had her medications this morning.  On visit 07/20/2024 with PCP blood pressure was normal.  Continue current medications.  Hyperlipidemia LDL 82.  Continue current Crestor  5 mg daily.  Murmur Echo has been ordered, we will schedule this.     Dispo: Follow-up in 1 month  Signed, Janus Vlcek VEAR Fishman, PA-C

## 2024-08-24 DIAGNOSIS — F419 Anxiety disorder, unspecified: Secondary | ICD-10-CM | POA: Diagnosis not present

## 2024-08-24 DIAGNOSIS — I499 Cardiac arrhythmia, unspecified: Secondary | ICD-10-CM | POA: Diagnosis not present

## 2024-08-24 DIAGNOSIS — I429 Cardiomyopathy, unspecified: Secondary | ICD-10-CM | POA: Diagnosis not present

## 2024-08-24 DIAGNOSIS — G4730 Sleep apnea, unspecified: Secondary | ICD-10-CM | POA: Diagnosis not present

## 2024-08-24 DIAGNOSIS — R011 Cardiac murmur, unspecified: Secondary | ICD-10-CM | POA: Diagnosis not present

## 2024-08-24 DIAGNOSIS — R32 Unspecified urinary incontinence: Secondary | ICD-10-CM | POA: Diagnosis not present

## 2024-08-24 DIAGNOSIS — J449 Chronic obstructive pulmonary disease, unspecified: Secondary | ICD-10-CM | POA: Diagnosis not present

## 2024-08-24 DIAGNOSIS — N1831 Chronic kidney disease, stage 3a: Secondary | ICD-10-CM | POA: Diagnosis not present

## 2024-08-24 DIAGNOSIS — M1990 Unspecified osteoarthritis, unspecified site: Secondary | ICD-10-CM | POA: Diagnosis not present

## 2024-08-24 DIAGNOSIS — E785 Hyperlipidemia, unspecified: Secondary | ICD-10-CM | POA: Diagnosis not present

## 2024-08-24 DIAGNOSIS — Z6841 Body Mass Index (BMI) 40.0 and over, adult: Secondary | ICD-10-CM | POA: Diagnosis not present

## 2024-08-24 DIAGNOSIS — I509 Heart failure, unspecified: Secondary | ICD-10-CM | POA: Diagnosis not present

## 2024-08-24 DIAGNOSIS — I130 Hypertensive heart and chronic kidney disease with heart failure and stage 1 through stage 4 chronic kidney disease, or unspecified chronic kidney disease: Secondary | ICD-10-CM | POA: Diagnosis not present

## 2024-09-01 ENCOUNTER — Other Ambulatory Visit: Payer: Self-pay | Admitting: General Practice

## 2024-09-01 NOTE — Telephone Encounter (Unsigned)
 Copied from CRM #[id number]. Topic: Clinical - Medication Refill >> Sep 01, 2024 12:00 PM Leah W wrote: Medication: albuterol  (VENTOLIN  HFA) 108 (90 Base) MCG/ACT inhaler    Has the patient contacted their pharmacy? Yes (Agent: If no, request that the patient contact the pharmacy for the refill. If patient does not wish to contact the pharmacy document the reason why and proceed with request.) (Agent: If yes, when and what did the pharmacy advise?)  This is the patient's preferred pharmacy:  [facility] - [place], [place] - [address] [place] [address] Phone: [phone] Fax: [phone]   Is this the correct pharmacy for this prescription? Yes If no, delete pharmacy and type the correct one.   Has the prescription been filled recently? No  Is the patient out of the medication? Yes  Has the patient been seen for an appointment in the last year OR does the patient have an upcoming appointment? Yes  Can we respond through MyChart? Yes  Agent: Please be advised that Rx refills may take up to 3 business days. We ask that you follow-up with your pharmacy.

## 2024-09-02 MED ORDER — ALBUTEROL SULFATE HFA 108 (90 BASE) MCG/ACT IN AERS: 1.0000 | INHALATION_SPRAY | Freq: Four times a day (QID) | RESPIRATORY_TRACT | 2 refills | Status: AC | PRN

## 2024-09-05 ENCOUNTER — Other Ambulatory Visit: Payer: Self-pay

## 2024-09-05 MED ORDER — BOOSTRIX 5-2.5-18.5 LF-MCG/0.5 IM SUSY: PREFILLED_SYRINGE | INTRAMUSCULAR | 0 refills | Status: DC

## 2024-09-05 MED ORDER — SHINGRIX 50 MCG/0.5ML IM SUSR: INTRAMUSCULAR | 1 refills | Status: DC

## 2024-09-06 ENCOUNTER — Other Ambulatory Visit: Payer: Self-pay

## 2024-10-11 ENCOUNTER — Other Ambulatory Visit: Payer: Self-pay | Admitting: General Practice

## 2024-10-11 DIAGNOSIS — E785 Hyperlipidemia, unspecified: Secondary | ICD-10-CM

## 2024-10-21 ENCOUNTER — Ambulatory Visit: Admitting: General Practice

## 2024-10-31 ENCOUNTER — Ambulatory Visit: Admitting: General Practice

## 2024-10-31 DIAGNOSIS — E785 Hyperlipidemia, unspecified: Secondary | ICD-10-CM

## 2024-11-09 ENCOUNTER — Ambulatory Visit: Admitting: General Practice

## 2024-11-17 ENCOUNTER — Encounter: Payer: Self-pay | Admitting: General Practice

## 2024-11-17 ENCOUNTER — Ambulatory Visit: Payer: Self-pay | Admitting: General Practice

## 2024-11-17 ENCOUNTER — Ambulatory Visit (INDEPENDENT_AMBULATORY_CARE_PROVIDER_SITE_OTHER): Admitting: General Practice

## 2024-11-17 VITALS — BP 124/82 | HR 93 | Temp 97.9°F | Wt 234.0 lb

## 2024-11-17 DIAGNOSIS — I1A0 Resistant hypertension: Secondary | ICD-10-CM

## 2024-11-17 DIAGNOSIS — J449 Chronic obstructive pulmonary disease, unspecified: Secondary | ICD-10-CM | POA: Diagnosis not present

## 2024-11-17 DIAGNOSIS — I110 Hypertensive heart disease with heart failure: Secondary | ICD-10-CM | POA: Diagnosis not present

## 2024-11-17 DIAGNOSIS — Z6841 Body Mass Index (BMI) 40.0 and over, adult: Secondary | ICD-10-CM

## 2024-11-17 DIAGNOSIS — E785 Hyperlipidemia, unspecified: Secondary | ICD-10-CM

## 2024-11-17 LAB — LIPID PANEL
Cholesterol: 155 mg/dL (ref 28–200)
HDL: 45.2 mg/dL
LDL Cholesterol: 96 mg/dL (ref 10–99)
NonHDL: 109.75
Total CHOL/HDL Ratio: 3
Triglycerides: 70 mg/dL (ref 10.0–149.0)
VLDL: 14 mg/dL (ref 0.0–40.0)

## 2024-11-17 LAB — COMPREHENSIVE METABOLIC PANEL WITH GFR
ALT: 16 U/L (ref 3–35)
AST: 16 U/L (ref 5–37)
Albumin: 3.6 g/dL (ref 3.5–5.2)
Alkaline Phosphatase: 61 U/L (ref 39–117)
BUN: 14 mg/dL (ref 6–23)
CO2: 35 meq/L — ABNORMAL HIGH (ref 19–32)
Calcium: 9.3 mg/dL (ref 8.4–10.5)
Chloride: 102 meq/L (ref 96–112)
Creatinine, Ser: 0.87 mg/dL (ref 0.40–1.20)
GFR: 72.04 mL/min
Glucose, Bld: 108 mg/dL — ABNORMAL HIGH (ref 70–99)
Potassium: 3.3 meq/L — ABNORMAL LOW (ref 3.5–5.1)
Sodium: 140 meq/L (ref 135–145)
Total Bilirubin: 0.4 mg/dL (ref 0.2–1.2)
Total Protein: 8.3 g/dL (ref 6.0–8.3)

## 2024-11-17 LAB — CBC
HCT: 38 % (ref 36.0–46.0)
Hemoglobin: 12.6 g/dL (ref 12.0–15.0)
MCHC: 33 g/dL (ref 30.0–36.0)
MCV: 91.3 fl (ref 78.0–100.0)
Platelets: 326 10*3/uL (ref 150.0–400.0)
RBC: 4.17 Mil/uL (ref 3.87–5.11)
RDW: 14.2 % (ref 11.5–15.5)
WBC: 3.9 10*3/uL — ABNORMAL LOW (ref 4.0–10.5)

## 2024-11-17 LAB — HEMOGLOBIN A1C: Hgb A1c MFr Bld: 5.4 % (ref 4.6–6.5)

## 2024-11-17 MED ORDER — LOSARTAN POTASSIUM 100 MG PO TABS: 100.0000 mg | ORAL_TABLET | Freq: Every day | ORAL | 3 refills | Status: AC

## 2024-11-17 MED ORDER — WEGOVY 1.5 MG PO TABS: 1.5000 mg | ORAL_TABLET | Freq: Every day | ORAL | 0 refills | Status: AC

## 2024-11-17 MED ORDER — SPIRONOLACTONE 25 MG PO TABS: ORAL_TABLET | ORAL | 3 refills | Status: AC

## 2024-11-17 NOTE — Patient Instructions (Addendum)
 Stop by the lab prior to leaving today. I will notify you of your results once received.   Make sure you schedule follow up with the heart doctor.   Start Wegovy  tablet 1.5 mg once daily in the morning on an empty stomach with 4 oz of water . Do not eat or drink for 30 minutes after taking the medication.  Follow up in 3 months for physical.   It was a pleasure to see you today!

## 2024-11-17 NOTE — Progress Notes (Signed)
 "  Established Patient Office Visit  Subjective   Patient ID: Michelle David, female    DOB: [birth year]  Age: 64 y.o. MRN: [id number]  Chief Complaint  Patient presents with   chronic care management    Patient following up on chronic conditions and needs fasting labs    HPI  Michelle David is a 64 year old female with past medical history of HTN, CHF, obesity, HLD, presents today for chronic care management.  Discussed the use of AI scribe software for clinical note transcription with the patient, who gave verbal consent to proceed.  History of Present Illness Michelle David is a 64 year old female with hypertension, COPD, and heart failure who presents for medication management and follow-up.  She is currently on losartan  100 mg once daily, spironolactone  25 mg once daily, carvedilol  5 mg twice daily, and Lasix  20 mg once daily for hypertension and heart failure. She last saw her cardiologist in October and may have missed a follow-up appointment, possibly due to transportation issues such as her car being in the shop. Blood pressure readings at home are around 115/80. No history of heart attacks or strokes.  She has a history of COPD and uses Spiriva and albuterol  as needed. No significant issues with COPD are reported. She does not engage in much physical activity and reports back and knee pain.  She is on Crestor  5 mg once daily for high cholesterol.   Her last thyroid  ultrasound was in 2023, with no follow-up since then. No family history of thyroid  cancer is noted.  She experiences back and knee pain, attributed to arthritis, with occasional knee swelling but no ankle swelling. She is trying to lose weight and exercise but finds it challenging due to pain.  She has been trying to avoid drinking [employer] and is attempting to exercise despite her back and knee pain.    Patient Active Problem List   Diagnosis Date Noted   Chronic obstructive pulmonary disease, unspecified  COPD type (HCC) 07/20/2024   History of colonic polyps 01/06/2024   Encounter for screening and preventative care 12/03/2023   Thyroid  nodule 08/21/2020   Chronic combined systolic and diastolic CHF (congestive heart failure) (HCC) 03/30/2017   Hyperlipidemia LDL goal <100 05/29/2013   Heart murmur 04/22/2011   Back pain 12/04/2008   Morbid obesity 03/31/2008   Malignant essential hypertension with congestive heart failure (HCC) 03/31/2008   Past Medical History:  Diagnosis Date   Abnormal EKG 02/05/2022   Allergic rhinitis 06/13/2017   Alopecia 07/08/2021   Blood in stool    Cardiomyopathy (HCC)    Diagnosed March 2018   CHF (congestive heart failure) (HCC)    a. EF 30-35% by echo in 12/2016 b. EF improved to 45-50% in 07/2018 c. 10/2020: EF improved to 50-55%.   Chronic hand pain, left 06/10/2020   Dyspepsia    Essential hypertension    Heart disease    Heart murmur    Hyperlipidemia    Hypoxia 11/01/2018   Impaired glucose tolerance    Insomnia due to anxiety and fear 07/08/2021   Multifocal pneumonia 11/01/2018   Obesity    Osteoarthritis of both knees 01/22/2018   Poor dentition 08/20/2017   Sleep apnea 12/17/2016   Snoring 01/22/2018   Solitary thyroid  nodule 03/01/2015   Dominant left thyroid  nodule 3.2 cm noted in 02/2015    Vitamin D  deficiency 03/17/2022   Past Surgical History:  Procedure Laterality Date   ABDOMINAL HYSTERECTOMY  COLONOSCOPY N/A 03/19/2018   Procedure: COLONOSCOPY;  Surgeon: Harvey Margo CROME, MD;  Location: [facility];  Service: Endoscopy;  Laterality: N/A;  2:00   COLONOSCOPY WITH PROPOFOL  N/A 01/06/2024   Procedure: COLONOSCOPY WITH PROPOFOL ;  Surgeon: Unk Corinn Skiff, MD;  Location: [facility];  Service: Gastroenterology;  Laterality: N/A;   OOPHORECTOMY     TUBAL LIGATION     Allergies[1]       11/17/2024    8:45 AM 07/20/2024   11:02 AM 07/01/2024   10:09 AM  Depression screen PHQ 2/9  Decreased Interest 0 0 0   Down, Depressed, Hopeless 0 0 0  PHQ - 2 Score 0 0 0  Altered sleeping 0 0 0  Tired, decreased energy 1 0 0  Change in appetite 2 0 0  Feeling bad or failure about yourself  0 0 0  Trouble concentrating 0 0 0  Moving slowly or fidgety/restless 0 0 0  Suicidal thoughts 0 0 0  PHQ-9 Score 3 0  0   Difficult doing work/chores Not difficult at all Not difficult at all Not difficult at all     Data saved with a previous flowsheet row definition       11/17/2024    8:46 AM 07/20/2024   11:03 AM 07/01/2024   10:10 AM 06/15/2024   10:29 AM  GAD 7 : Generalized Anxiety Score  Nervous, Anxious, on Edge 0 0  0  0   Control/stop worrying 0 0  0  0   Worry too much - different things 0 0  0  1   Trouble relaxing 0 0  0  0   Restless 2 0  0  1   Easily annoyed or irritable 0 0  0  0   Afraid - awful might happen 0 0  0  0   Total GAD 7 Score 2 0 0 2  Anxiety Difficulty Not difficult at all Not difficult at all Not difficult at all Not difficult at all     Data saved with a previous flowsheet row definition      Review of Systems  Constitutional:  Negative for chills and fever.  Respiratory:  Negative for shortness of breath.   Cardiovascular:  Negative for chest pain.  Gastrointestinal:  Negative for abdominal pain, constipation, diarrhea, heartburn, nausea and vomiting.  Genitourinary:  Negative for dysuria, frequency and urgency.  Neurological:  Negative for dizziness and headaches.  Endo/Heme/Allergies:  Negative for polydipsia.  Psychiatric/Behavioral:  Negative for depression and suicidal ideas. The patient is not nervous/anxious.       Objective:     BP 124/82   Pulse 93   Temp 97.9 F (36.6 C) (Temporal)   Wt 234 lb (106.1 kg)   SpO2 96%   BMI 42.80 kg/m  BP Readings from Last 3 Encounters:  11/17/24 124/82  07/22/24 (!) 182/87  07/20/24 124/80   Wt Readings from Last 3 Encounters:  11/17/24 234 lb (106.1 kg)  07/22/24 231 lb 3.2 oz (104.9 kg)  07/20/24 230 lb  (104.3 kg)      Physical Exam Vitals and nursing note reviewed.  Constitutional:      Appearance: Normal appearance.  Cardiovascular:     Rate and Rhythm: Normal rate and regular rhythm.     Pulses: Normal pulses.     Heart sounds: Normal heart sounds.  Pulmonary:     Effort: Pulmonary effort is normal.     Breath sounds: Normal breath sounds.  Neurological:     Mental Status: She is alert and oriented to person, place, and time.  Psychiatric:        Mood and Affect: Mood normal.        Behavior: Behavior normal.        Thought Content: Thought content normal.        Judgment: Judgment normal.      No results found for any visits on 11/17/24.     The 10-year ASCVD risk score (Arnett DK, et al., 2019) is: 5.2%    Assessment & Plan:  Hyperlipidemia LDL goal <100 -     Lipid panel -     CBC -     Comprehensive metabolic panel with GFR -     Hemoglobin A1c  Chronic obstructive pulmonary disease, unspecified COPD type (HCC)  Malignant essential hypertension with congestive heart failure (HCC) -     Spironolactone ; Take one tablet by mouth once daily  Dispense: 90 tablet; Refill: 3  Resistant hypertension -     Losartan  Potassium; Take 1 tablet (100 mg total) by mouth daily.  Dispense: 90 tablet; Refill: 3  Morbid obesity -     Wegovy ; Take 1 tablet (1.5 mg total) by mouth daily. Daily in AM on an empty stomach with 4 oz of water . Do not eat or drink for 30 minutes after dose.  Dispense: 30 tablet; Refill: 0   Assessment and Plan Assessment & Plan Morbid obesity Considering pharmacological intervention with Wegovy  pill. Insurance coverage uncertain, cost a concern. - Sent prescription for Wegovy  pill to pharmacy to check insurance coverage. - Advised taking Wegovy  pill first thing in the morning on an empty stomach once daily if covered.  Hypertensive heart disease with heart failure Managed with losartan , spironolactone , carvedilol , and Lasix . - Ensure  follow-up with cardiology. - Continue losartan  100 mg once daily, spironolactone  25 mg once daily, carvedilol  5 mg twice daily, Lasix  20 mg once daily.  Chronic obstructive pulmonary disease Managed with Spiriva and albuterol . No exacerbations reported. - Continue Spiriva and albuterol  as needed.  Hyperlipidemia Managed with rosuvastatin . Labs to assess status ordered. - Ordered labs to check cholesterol levels. - Continue rosuvastatin  5 mg once daily.     Return in about 3 months (around 02/15/2025) for physical.    Carrol Aurora, NP     [1] No Known Allergies  "

## 2024-12-06 ENCOUNTER — Encounter: Admitting: General Practice
